# Patient Record
Sex: Female | Born: 2000
Health system: Southern US, Community
[De-identification: ages and names within clinical notes are randomized; demographics above are authoritative.]

## PROBLEM LIST (undated history)

## (undated) ENCOUNTER — Ambulatory Visit (HOSPITAL_COMMUNITY): Discharge status: Absent without leave | Payer: Medicaid Other

## (undated) DIAGNOSIS — R51 Headache: Secondary | ICD-10-CM

## (undated) DIAGNOSIS — H539 Unspecified visual disturbance: Secondary | ICD-10-CM

## (undated) DIAGNOSIS — J45909 Unspecified asthma, uncomplicated: Secondary | ICD-10-CM

## (undated) DIAGNOSIS — F32A Depression, unspecified: Secondary | ICD-10-CM

## (undated) DIAGNOSIS — F99 Mental disorder, not otherwise specified: Secondary | ICD-10-CM

## (undated) DIAGNOSIS — O24419 Gestational diabetes mellitus in pregnancy, unspecified control: Secondary | ICD-10-CM

## (undated) DIAGNOSIS — T7840XA Allergy, unspecified, initial encounter: Secondary | ICD-10-CM

## (undated) DIAGNOSIS — F3481 Disruptive mood dysregulation disorder: Secondary | ICD-10-CM

## (undated) DIAGNOSIS — E669 Obesity, unspecified: Secondary | ICD-10-CM

## (undated) DIAGNOSIS — F329 Major depressive disorder, single episode, unspecified: Secondary | ICD-10-CM

## (undated) DIAGNOSIS — F419 Anxiety disorder, unspecified: Secondary | ICD-10-CM

## (undated) DIAGNOSIS — Z0101 Encounter for examination of eyes and vision with abnormal findings: Secondary | ICD-10-CM

## (undated) DIAGNOSIS — F411 Generalized anxiety disorder: Secondary | ICD-10-CM

## (undated) HISTORY — DX: Unspecified asthma, uncomplicated: J45.909

## (undated) HISTORY — DX: Anxiety disorder, unspecified: F41.9

## (undated) HISTORY — DX: Gestational diabetes mellitus in pregnancy, unspecified control: O24.419

## (undated) HISTORY — DX: Generalized anxiety disorder: F41.1

## (undated) HISTORY — PX: NO PAST SURGERIES: SHX2092

## (undated) HISTORY — DX: Encounter for examination of eyes and vision with abnormal findings: Z01.01

## (undated) HISTORY — DX: Obesity, unspecified: E66.9

---
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
## 2022-01-07 ENCOUNTER — Ambulatory Visit: Payer: Medicaid Other | Admitting: Pediatrics

## 2022-01-13 ENCOUNTER — Encounter: Payer: Self-pay | Admitting: Pediatrics

## 2022-01-13 ENCOUNTER — Ambulatory Visit (INDEPENDENT_AMBULATORY_CARE_PROVIDER_SITE_OTHER): Payer: Medicaid Other | Admitting: Pediatrics

## 2022-01-13 ENCOUNTER — Other Ambulatory Visit: Payer: Self-pay

## 2022-01-13 ENCOUNTER — Other Ambulatory Visit (HOSPITAL_COMMUNITY)
Admission: RE | Admit: 2022-01-13 | Discharge: 2022-01-13 | Disposition: A | Discharge status: Home / Self Care | Payer: Medicaid Other | Source: Ambulatory Visit | Attending: Pediatrics | Admitting: Pediatrics

## 2022-01-13 VITALS — BP 135/83 | HR 106 | Ht 62.0 in | Wt 236.2 lb

## 2022-01-13 DIAGNOSIS — R0683 Snoring: Secondary | ICD-10-CM

## 2022-01-13 DIAGNOSIS — J309 Allergic rhinitis, unspecified: Secondary | ICD-10-CM

## 2022-01-13 DIAGNOSIS — J351 Hypertrophy of tonsils: Secondary | ICD-10-CM | POA: Insufficient documentation

## 2022-01-13 DIAGNOSIS — Z1389 Encounter for screening for other disorder: Secondary | ICD-10-CM

## 2022-01-13 DIAGNOSIS — R7303 Prediabetes: Secondary | ICD-10-CM

## 2022-01-13 DIAGNOSIS — F411 Generalized anxiety disorder: Secondary | ICD-10-CM | POA: Diagnosis not present

## 2022-01-13 DIAGNOSIS — Z113 Encounter for screening for infections with a predominantly sexual mode of transmission: Secondary | ICD-10-CM | POA: Insufficient documentation

## 2022-01-13 DIAGNOSIS — R030 Elevated blood-pressure reading, without diagnosis of hypertension: Secondary | ICD-10-CM | POA: Insufficient documentation

## 2022-01-13 DIAGNOSIS — Z68.41 Body mass index (BMI) pediatric, greater than or equal to 95th percentile for age: Secondary | ICD-10-CM

## 2022-01-13 DIAGNOSIS — F902 Attention-deficit hyperactivity disorder, combined type: Secondary | ICD-10-CM

## 2022-01-13 DIAGNOSIS — F332 Major depressive disorder, recurrent severe without psychotic features: Secondary | ICD-10-CM

## 2022-01-13 DIAGNOSIS — Z3202 Encounter for pregnancy test, result negative: Secondary | ICD-10-CM | POA: Diagnosis not present

## 2022-01-13 DIAGNOSIS — G479 Sleep disorder, unspecified: Secondary | ICD-10-CM

## 2022-01-13 LAB — POCT URINALYSIS DIPSTICK
Bilirubin, UA: NEGATIVE
Blood, UA: NEGATIVE
Glucose, UA: NEGATIVE
Ketones, UA: NEGATIVE
Leukocytes, UA: NEGATIVE
Nitrite, UA: NEGATIVE
Protein, UA: NEGATIVE
Spec Grav, UA: 1.02 (ref 1.010–1.025)
Urobilinogen, UA: NEGATIVE E.U./dL — AB
pH, UA: 5 (ref 5.0–8.0)

## 2022-01-13 LAB — POCT URINE PREGNANCY: Preg Test, Ur: NEGATIVE

## 2022-01-13 MED ORDER — LISDEXAMFETAMINE DIMESYLATE 40 MG PO CAPS: 40.0000 mg | ORAL_CAPSULE | Freq: Every day | ORAL | 0 refills | Status: DC

## 2022-01-13 MED ORDER — LEVOCETIRIZINE DIHYDROCHLORIDE 5 MG PO TABS: 5.0000 mg | ORAL_TABLET | Freq: Every evening | ORAL | 3 refills | Status: DC

## 2022-01-13 MED ORDER — INSUPEN PEN NEEDLES 32G X 4 MM MISC: 3 refills | Status: DC

## 2022-01-13 MED ORDER — VICTOZA 18 MG/3ML ~~LOC~~ SOPN: 1.2000 mg | PEN_INJECTOR | Freq: Every day | SUBCUTANEOUS | 0 refills | Status: DC

## 2022-01-13 MED ORDER — PRENATAL VITAMIN 27-0.8 MG PO TABS: 1.0000 | ORAL_TABLET | Freq: Every day | ORAL | 3 refills | Status: DC

## 2022-01-13 MED ORDER — TRIAMCINOLONE ACETONIDE 0.5 % EX OINT: 1.0000 "application " | TOPICAL_OINTMENT | Freq: Two times a day (BID) | CUTANEOUS | 3 refills | Status: DC

## 2022-01-13 NOTE — Patient Instructions (Addendum)
Sent referral to ENT - [phone] Start xyzal for itching and allergies  Restart vyvanse  Restart victoza  Triamcinolone for eczema

## 2022-01-13 NOTE — Progress Notes (Signed)
History was provided by the patient.  Courtney Holloway is a 21 y.o. female who is here for MDD, GAD, obesity, sleep  Courtney Mcburney, FNP   HPI:  Pt reports she has been working full time and she graduates in May. 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 She had a new boyfriend but he was cheating on her so they broke up. Still living with her aunt. 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   She went to the orthodontist but treatment very expensive. Orthodontist as concerned that her tonsils are very large and her breathing was very loud. Adenoids also enlarged. She snores very loudly at night. She continues to be very tired during the day and does not feel well rested at all. She uses coffee and red bull to stay awake.   Having increased pain from large breasts. She is planning to continue working on losing weight.   She has been considering birth control more. She still wants kids but not sure it is the right time in her life. Considering IUD but not currently sexually active.   LMP was 12/25/2021  Has not been taking anything except wellbutrin and valtrex. Would like to restart victoza, vyvanse, prenatal vitamin.   PHQ-SADS Last 3 Score only 01/13/2022 11/14/2021 10/08/2021  PHQ-15 Score 19 18 18   Total GAD-7 Score 14 14 14   PHQ Adolescent Score 18 15 13   Some encounter information is confidential and restricted. Go to Review Flowsheets activity to see all data.      Patient's last menstrual period was 12/25/2021.  Review of Systems  Constitutional:  Positive for malaise/fatigue.  HENT:  Positive for congestion.   Respiratory:  Negative for cough.   Skin:  Positive for itching.  Endo/Heme/Allergies:  Positive for environmental allergies.  Psychiatric/Behavioral:  Positive for depression. Negative for suicidal ideas. The patient is nervous/anxious.    Patient Active Problem List   Diagnosis Date Noted   Elevated blood pressure  reading without diagnosis of hypertension 01/13/2022   Enlarged tonsils 01/13/2022   Body mass index (BMI) of 39.0 to 39.9 in adult 09/02/2021   Irregular menses 08/19/2021   Prediabetes 08/04/2021   Nightmares associated with chronic post-traumatic stress disorder 04/01/2021   Food allergy 04/01/2021   Anal fissure 03/28/2021   Sleep disturbance 03/21/2021   Drug overdose, multiple drugs, undetermined intent, initial encounter 02/02/2021   Attention deficit hyperactivity disorder (ADHD), combined type 01/28/2021   MDD (major depressive disorder), recurrent severe, without psychosis ([place]) 01/16/2021   HSV-1 (herpes simplex virus 1) infection 08/02/2020   Menorrhagia with regular cycle 07/05/2020   Dyslipidemia 02/15/2018   Hidradenitis axillaris 07/11/2016   Failed vision screen 11/21/2014   Allergic rhinitis 11/21/2014   Slow transit constipation 11/21/2014   Asthma 05/04/2013   GAD (generalized anxiety disorder) 11/02/2012   Post-traumatic stress disorder 11/02/2012    Current Outpatient Medications on File Prior to Visit  Medication Sig Dispense Refill   albuterol (VENTOLIN HFA) 108 (90 Base) MCG/ACT inhaler Inhale 2 puffs into the lungs every 4 (four) hours as needed for wheezing or shortness of breath. 18 g 1   buPROPion (WELLBUTRIN XL) 150 MG 24 hr tablet Take 1 tablet (150 mg total) by mouth daily. 30 tablet 3   EPINEPHrine (EPIPEN 2-PAK) 0.3 mg/0.3 mL IJ SOAJ injection Inject 0.3 mg into the muscle as needed for anaphylaxis. 1 each 1   valACYclovir (VALTREX) 1000 MG tablet Take 1  tablet (1,000 mg total) by mouth daily. 30 tablet 3   No current facility-administered medications on file prior to visit.    Allergies  Allergen Reactions   Other Anaphylaxis and Itching    Seasonal allergies, dog fur, certain detergents  Pt. Reports mother has used "epi-pen" for past allergies.    Pineapple Anaphylaxis    Social History: Confidentiality was discussed with the patient and  if applicable, with caregiver as well. Tobacco: yes Secondhand smoke exposure? yes - aunt Drugs/EtOH: MJ Sexually active? Just broke up with partner   Safety: safe to self today and at home, no SI.  Last STI Screening:today  Pregnancy Prevention: none, considering.   Physical Exam:    Vitals:   01/13/22 0935  BP: 135/83  Pulse: (!) 106  Weight: 236 lb 3.2 oz (107.1 kg)  Height: 5\' 2"  (1.575 m)    Blood pressure percentiles are not available for patients who are 18 years or older.  Physical Exam Vitals and nursing note reviewed.  Constitutional:      General: She is not in acute distress.    Appearance: She is well-developed.  HENT:     Right Ear: No middle ear effusion.     Left Ear:  No middle ear effusion.     Nose:     Right Turbinates: Enlarged and swollen.     Left Turbinates: Enlarged and swollen.     Mouth/Throat:     Tonsils: No tonsillar exudate. 4+ on the right. 4+ on the left.  Neck:     Thyroid: No thyromegaly.  Cardiovascular:     Rate and Rhythm: Normal rate and regular rhythm.     Heart sounds: No murmur heard. Pulmonary:     Breath sounds: Normal breath sounds.  Abdominal:     Palpations: Abdomen is soft. There is no mass.     Tenderness: There is no abdominal tenderness. There is no guarding.  Musculoskeletal:     Right lower leg: No edema.     Left lower leg: No edema.  Lymphadenopathy:     Cervical: No cervical adenopathy.  Skin:    General: Skin is warm.     Findings: No rash.  Neurological:     Mental Status: She is alert.     Comments: No tremor  Psychiatric:        Mood and Affect: Mood and affect normal.        Thought Content: Thought content does not include suicidal ideation.    Assessment/Plan: 1. MDD (major depressive disorder), recurrent severe, without psychosis ([place]) Continue wellbutrin. We discussed restarting trileptal or desvenlafaxine but she declined today and feels ok. PHQSADs is stable with no SI/HI today.   2. GAD  (generalized anxiety disorder) As above. Has a mentor which has been helpful.   3. Severe obesity due to excess calories without serious comorbidity with body mass index (BMI) greater than 99th percentile for age in pediatric patient [facility]) Restart victoza; she is working with her aunt on healthier eating.  - VICTOZA 18 MG/3ML SOPN; Inject 1.2 mg into the skin daily.  Dispense: 6 mL; Refill: 0  4. Snoring Snoring likely d/t significantly enlarged tonsils and reportedly adenoids. Will send to ENT for eval for removal.   5. Enlarged tonsils May be contributing to fatigue, htn and weight gain.  - Ambulatory referral to ENT  6. Prediabetes Recheck A1C today, restart victoza.  - Hemoglobin A1c - VICTOZA 18 MG/3ML SOPN; Inject 1.2 mg into the skin  daily.  Dispense: 6 mL; Refill: 0 - Prenatal Vit-Fe Fumarate-FA (PRENATAL VITAMIN) 27-0.8 MG TABS; Take 1 tablet by mouth daily.  Dispense: 30 tablet; Refill: 3 - Insulin Pen Needle (INSUPEN PEN NEEDLES) 32G X 4 MM MISC; BD Pen Needles- use one for victoza  Dispense: 90 each; Refill: 3  7. Attention deficit hyperactivity disorder (ADHD), combined type Increase vyvanse and restart. May help with binge eating as well.  - lisdexamfetamine (VYVANSE) 40 MG capsule; Take 1 capsule (40 mg total) by mouth daily with breakfast.  Dispense: 30 capsule; Refill: 0  8. Allergic rhinitis, unspecified seasonality, unspecified trigger Restart xyzal- should help with itching r/t dogs as well.  - levocetirizine (XYZAL) 5 MG tablet; Take 1 tablet (5 mg total) by mouth every evening.  Dispense: 30 tablet; Refill: 3  9. Screening for genitourinary condition WNL. No glucose or ketones.   10. Pregnancy examination or test, negative result Negative. Does not desire pregnancy currently- we discussed contraception options and is considering IUD.  - POCT urine pregnancy  11. Routine screening for STI (sexually transmitted infection) New partner, STI screens today.  -  Urine cytology ancillary only - HIV antibody (with reflex) - RPR - WET PREP BY MOLECULAR PROBE  Return in 4 weeks or sooner as needed.   Jonathon Resides, FNP   I spent >40 minutes spent face to face with patient with more than 50% of appointment spent discussing diagnosis, management, follow-up, and reviewing of adhd, mdd, gad, predm, snoring, tonsils. I spent an additional 0 minutes on pre-and post-visit activities.

## 2022-01-14 LAB — WET PREP BY MOLECULAR PROBE
Candida species: NOT DETECTED
Gardnerella vaginalis: NOT DETECTED
MICRO NUMBER:: 12938185
SPECIMEN QUALITY:: ADEQUATE
Trichomonas vaginosis: NOT DETECTED

## 2022-01-14 LAB — HEMOGLOBIN A1C
Hgb A1c MFr Bld: 5.9 % of total Hgb — ABNORMAL HIGH (ref <5.7)
Mean Plasma Glucose: 123 mg/dL
eAG (mmol/L): 6.8 mmol/L

## 2022-01-14 LAB — URINE CYTOLOGY ANCILLARY ONLY
Bacterial Vaginitis-Urine: NEGATIVE
Candida Urine: POSITIVE — AB
Chlamydia: NEGATIVE
Comment: NEGATIVE
Comment: NEGATIVE
Comment: NORMAL
Neisseria Gonorrhea: NEGATIVE
Trichomonas: NEGATIVE

## 2022-01-14 LAB — HIV ANTIBODY (ROUTINE TESTING W REFLEX): HIV 1&2 Ab, 4th Generation: NONREACTIVE

## 2022-01-14 LAB — TSH+FREE T4: TSH W/REFLEX TO FT4: 1.09 mIU/L

## 2022-01-14 LAB — RPR: RPR Ser Ql: NONREACTIVE

## 2022-01-20 ENCOUNTER — Other Ambulatory Visit: Payer: Self-pay | Admitting: Pediatrics

## 2022-01-20 MED ORDER — FLUCONAZOLE 150 MG PO TABS: ORAL_TABLET | ORAL | 0 refills | Status: DC

## 2022-02-06 ENCOUNTER — Ambulatory Visit: Payer: Medicaid Other | Admitting: Pediatrics

## 2022-02-13 ENCOUNTER — Ambulatory Visit: Payer: Medicaid Other | Admitting: Obstetrics

## 2022-02-28 ENCOUNTER — Encounter: Payer: Self-pay | Admitting: Family

## 2022-02-28 ENCOUNTER — Other Ambulatory Visit: Payer: Self-pay

## 2022-02-28 ENCOUNTER — Ambulatory Visit (INDEPENDENT_AMBULATORY_CARE_PROVIDER_SITE_OTHER): Payer: Medicaid Other | Admitting: Family

## 2022-02-28 ENCOUNTER — Encounter: Payer: Self-pay | Admitting: Pediatrics

## 2022-02-28 VITALS — BP 105/68 | HR 98 | Wt 238.8 lb

## 2022-02-28 DIAGNOSIS — N941 Unspecified dyspareunia: Secondary | ICD-10-CM | POA: Diagnosis not present

## 2022-02-28 DIAGNOSIS — N898 Other specified noninflammatory disorders of vagina: Secondary | ICD-10-CM

## 2022-02-28 DIAGNOSIS — Z3202 Encounter for pregnancy test, result negative: Secondary | ICD-10-CM | POA: Diagnosis not present

## 2022-02-28 DIAGNOSIS — N926 Irregular menstruation, unspecified: Secondary | ICD-10-CM | POA: Diagnosis not present

## 2022-02-28 DIAGNOSIS — N949 Unspecified condition associated with female genital organs and menstrual cycle: Secondary | ICD-10-CM | POA: Diagnosis not present

## 2022-02-28 DIAGNOSIS — Z113 Encounter for screening for infections with a predominantly sexual mode of transmission: Secondary | ICD-10-CM

## 2022-02-28 LAB — POCT URINE PREGNANCY: Preg Test, Ur: NEGATIVE

## 2022-02-28 MED ORDER — DOXYCYCLINE HYCLATE 100 MG PO CAPS: 100.0000 mg | ORAL_CAPSULE | Freq: Two times a day (BID) | ORAL | 0 refills | Status: DC

## 2022-02-28 MED ORDER — CEFTRIAXONE SODIUM 500 MG IJ SOLR
500.0000 mg | Freq: Once | INTRAMUSCULAR | Status: AC
  Administered 2022-02-28: 500 mg via INTRAMUSCULAR

## 2022-02-28 MED ORDER — METRONIDAZOLE 500 MG PO TABS: 500.0000 mg | ORAL_TABLET | Freq: Two times a day (BID) | ORAL | 0 refills | Status: DC

## 2022-02-28 NOTE — Progress Notes (Signed)
History was provided by the patient. ? ?Courtney Holloway is a 21 y.o. female who is here for vaginal discharge, concern for STIs, pain with intercourse.  ? ?PCP confirmed? Yes.   ? Verneda Skill, FNP ? 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 ?Pain with intercourse, with penetration and insertion  ?Female partner smells bad  ?Discharge: grey, clear thick liquid  ?Birth control: none; didn't want pg with this female partner; but was interested in pg with other; hasn't been working; taking prenatals but can't get pregnant. Frustrated by this.  ?LMP: something is off, has been very late; last period was 2/9-2/12, 1/11-1/15; was supposed to come on 3/7  ?Had intercourse 2/10, 2/16, 2/20   ? ?Patient Active Problem List  ? Diagnosis Date Noted  ? Elevated blood pressure reading without diagnosis of hypertension 01/13/2022  ? Enlarged tonsils 01/13/2022  ? Body mass index (BMI) of 39.0 to 39.9 in adult 09/02/2021  ? Irregular menses 08/19/2021  ? Prediabetes 08/04/2021  ? Nightmares associated with chronic post-traumatic stress disorder 04/01/2021  ? Food allergy 04/01/2021  ? Anal fissure 03/28/2021  ? Sleep disturbance 03/21/2021  ? Drug overdose, multiple drugs, undetermined intent, initial encounter 02/02/2021  ? Attention deficit hyperactivity disorder (ADHD), combined type 01/28/2021  ? MDD (major depressive disorder), recurrent severe, without psychosis ([facility]) 01/16/2021  ? HSV-1 (herpes simplex virus 1) infection 08/02/2020  ? Menorrhagia with regular cycle 07/05/2020  ? Dyslipidemia 02/15/2018  ? Hidradenitis axillaris 07/11/2016  ? Failed vision screen 11/21/2014  ? Allergic rhinitis 11/21/2014  ? Slow transit constipation 11/21/2014  ? Asthma 05/04/2013  ? GAD (generalized anxiety disorder) 11/02/2012  ? Post-traumatic stress disorder 11/02/2012  ? ? ?Current  Outpatient Medications on File Prior to Visit  ?Medication Sig Dispense Refill  ? albuterol (VENTOLIN HFA) 108 (90 Base) MCG/ACT inhaler Inhale 2 puffs into the lungs every 4 (four) hours as needed for wheezing or shortness of breath. 18 g 1  ? buPROPion (WELLBUTRIN XL) 150 MG 24 hr tablet Take 1 tablet (150 mg total) by mouth daily. 30 tablet 3  ? EPINEPHrine (EPIPEN 2-PAK) 0.3 mg/0.3 mL IJ SOAJ injection Inject 0.3 mg into the muscle as needed for anaphylaxis. 1 each 1  ? fluconazole (DIFLUCAN) 150 MG tablet Take 1 tablet today and 1 tablet 3 days from now (Patient not taking: Reported on 02/28/2022) 2 tablet 0  ? Insulin Pen Needle (INSUPEN PEN NEEDLES) 32G X 4 MM MISC BD Pen Needles- use one for victoza 90 each 3  ? levocetirizine (XYZAL) 5 MG tablet Take 1 tablet (5 mg total) by mouth every evening. 30 tablet 3  ? lisdexamfetamine (VYVANSE) 40 MG capsule Take 1 capsule (40 mg total) by mouth daily with breakfast. 30 capsule 0  ? Prenatal Vit-Fe Fumarate-FA (PRENATAL VITAMIN) 27-0.8 MG TABS Take 1 tablet by mouth daily. 30 tablet 3  ? triamcinolone ointment (KENALOG) 0.5 % Apply 1 application topically 2 (two) times daily. 60 g 3  ? valACYclovir (VALTREX) 1000 MG tablet Take 1 tablet (1,000 mg total) by mouth daily. 30 tablet 3  ? VICTOZA 18 MG/3ML SOPN Inject 1.2 mg into the skin daily. 6 mL 0  ? ?No current facility-administered medications on file prior to visit.  ? ? ?Allergies  ?Allergen Reactions  ? Other Anaphylaxis and Itching  ?  Seasonal allergies, dog fur, certain detergents  Pt. Reports mother has used "epi-pen" for past allergies.   ? Pineapple Anaphylaxis  ? ? ?Physical Exam:  ?  ?Vitals:  ? 02/28/22 0920  ?BP: 105/68  ?Pulse: 98  ?Weight: 238 lb 12.8 oz (108.3 kg)  ? ? ?Blood pressure percentiles are not available for patients who are 18 years or older. ?No LMP recorded. ? ?Physical Exam ?Exam conducted with a chaperone present.  ?Constitutional:   ?   General: She is not in acute distress. ?    Appearance: She is well-developed.  ?HENT:  ?   Head: Normocephalic and atraumatic.  ?Eyes:  ?   General: No scleral icterus. ?   Pupils: Pupils are equal, round, and reactive to light.  ?Neck:  ?   Thyroid: No thyromegaly.  ?Cardiovascular:  ?   Rate and Rhythm: Normal rate and regular rhythm.  ?   Heart sounds: Normal heart sounds. No murmur heard. ?Pulmonary:  ?   Effort: Pulmonary effort is normal.  ?   Breath sounds: Normal breath sounds.  ?Abdominal:  ?   Palpations: Abdomen is soft.  ?Genitourinary: ?   General: Normal vulva.  ?   Vagina: Vaginal discharge present.  ?   Cervix: Cervical motion tenderness and discharge present. No friability or cervical bleeding.  ?   Comments: Thick white discharge  ?Musculoskeletal:     ?   General: Normal range of motion.  ?   Cervical back: Normal range of motion and neck supple.  ?Lymphadenopathy:  ?   Cervical: No cervical adenopathy.  ?Skin: ?   General: Skin is warm and dry.  ?   Findings: No rash.  ?Neurological:  ?   Mental Status: She is alert and oriented to person, place, and time.  ?   Cranial Nerves: No cranial nerve deficit.  ?Psychiatric:     ?   Behavior: Behavior normal.     ?   Thought Content: Thought content normal.     ?   Judgment: Judgment normal.  ?  ? ?Assessment/Plan: ?1. Late period ?2. Dyspareunia in female ?3. CMT (cervical motion tenderness) ?4. Vaginal discharge ? ?-will obtain beta hcg today, negative UPT. +CMT on exam and tenderness with speculum; will treat for PID ?-swabs obtained and will relay results via My Chart or phone; continue with DMV/PNV ? ?

## 2022-03-02 LAB — C. TRACHOMATIS/N. GONORRHOEAE RNA
C. trachomatis RNA, TMA: NOT DETECTED
N. gonorrhoeae RNA, TMA: NOT DETECTED

## 2022-03-03 LAB — WET PREP BY MOLECULAR PROBE
Candida species: NOT DETECTED
Gardnerella vaginalis: NOT DETECTED
MICRO NUMBER:: 13148598
SPECIMEN QUALITY:: ADEQUATE
Trichomonas vaginosis: NOT DETECTED

## 2022-03-11 ENCOUNTER — Other Ambulatory Visit (HOSPITAL_COMMUNITY): Payer: Self-pay

## 2022-03-11 ENCOUNTER — Other Ambulatory Visit: Payer: Self-pay | Admitting: Pediatrics

## 2022-03-11 DIAGNOSIS — F902 Attention-deficit hyperactivity disorder, combined type: Secondary | ICD-10-CM

## 2022-03-11 MED ORDER — LISDEXAMFETAMINE DIMESYLATE 40 MG PO CAPS
40.0000 mg | ORAL_CAPSULE | Freq: Every day | ORAL | 0 refills | Status: DC
  Filled 2022-03-11: qty 30, 30d supply, fill #0

## 2022-03-13 ENCOUNTER — Ambulatory Visit: Payer: Medicaid Other | Admitting: Family

## 2022-03-20 ENCOUNTER — Ambulatory Visit: Payer: Medicaid Other | Admitting: Family

## 2022-04-10 ENCOUNTER — Ambulatory Visit: Payer: Medicaid Other

## 2022-04-15 ENCOUNTER — Other Ambulatory Visit (HOSPITAL_COMMUNITY): Payer: Self-pay

## 2022-04-15 ENCOUNTER — Other Ambulatory Visit: Payer: Self-pay | Admitting: Pediatrics

## 2022-04-15 DIAGNOSIS — F902 Attention-deficit hyperactivity disorder, combined type: Secondary | ICD-10-CM

## 2022-04-16 ENCOUNTER — Other Ambulatory Visit (HOSPITAL_COMMUNITY): Payer: Self-pay

## 2022-04-16 MED ORDER — LISDEXAMFETAMINE DIMESYLATE 40 MG PO CAPS
40.0000 mg | ORAL_CAPSULE | Freq: Every day | ORAL | 0 refills | Status: DC
  Filled 2022-04-16: qty 30, 30d supply, fill #0

## 2022-04-18 ENCOUNTER — Telehealth: Payer: Medicaid Other | Admitting: Family

## 2022-04-18 ENCOUNTER — Encounter: Payer: Medicaid Other | Admitting: Physician Assistant

## 2022-04-18 DIAGNOSIS — J069 Acute upper respiratory infection, unspecified: Secondary | ICD-10-CM

## 2022-04-18 MED ORDER — FLUTICASONE PROPIONATE 50 MCG/ACT NA SUSP: 2.0000 | Freq: Every day | NASAL | 6 refills | Status: DC

## 2022-04-18 MED ORDER — BENZONATATE 100 MG PO CAPS: 100.0000 mg | ORAL_CAPSULE | Freq: Three times a day (TID) | ORAL | 0 refills | Status: DC | PRN

## 2022-04-18 NOTE — Progress Notes (Signed)
The patient no-showed for appointment despite this provider sending direct link x 2 with no response and waiting for at least 10 minutes from appointment time for patient to join. They will be marked as a NS for this appointment/time.  ? ?Patient did complete an E-visit as well. Suspect error duplicate. Will mark erroneous.  ? ?Margaretann Loveless, PA-C ? ? ? ?

## 2022-04-18 NOTE — Progress Notes (Signed)

## 2022-04-27 ENCOUNTER — Encounter: Payer: Self-pay | Admitting: Family

## 2022-04-28 ENCOUNTER — Ambulatory Visit: Payer: Medicaid Other

## 2022-05-01 ENCOUNTER — Ambulatory Visit (INDEPENDENT_AMBULATORY_CARE_PROVIDER_SITE_OTHER): Payer: Medicaid Other

## 2022-05-01 DIAGNOSIS — Z3202 Encounter for pregnancy test, result negative: Secondary | ICD-10-CM

## 2022-05-01 DIAGNOSIS — Z113 Encounter for screening for infections with a predominantly sexual mode of transmission: Secondary | ICD-10-CM

## 2022-05-01 LAB — POCT URINE PREGNANCY: Preg Test, Ur: NEGATIVE

## 2022-05-01 NOTE — Progress Notes (Signed)
Pt here today for STI screening.Follow up appointment scheduled with provider to discuss further issues per pt.

## 2022-05-02 ENCOUNTER — Encounter: Payer: Self-pay | Admitting: Family

## 2022-05-02 ENCOUNTER — Ambulatory Visit (INDEPENDENT_AMBULATORY_CARE_PROVIDER_SITE_OTHER): Payer: Medicaid Other | Admitting: Family

## 2022-05-02 VITALS — BP 115/80 | HR 101 | Ht 62.3 in | Wt 228.8 lb

## 2022-05-02 DIAGNOSIS — L258 Unspecified contact dermatitis due to other agents: Secondary | ICD-10-CM | POA: Diagnosis not present

## 2022-05-02 DIAGNOSIS — S30810A Abrasion of lower back and pelvis, initial encounter: Secondary | ICD-10-CM

## 2022-05-02 DIAGNOSIS — L732 Hidradenitis suppurativa: Secondary | ICD-10-CM | POA: Diagnosis not present

## 2022-05-02 DIAGNOSIS — N898 Other specified noninflammatory disorders of vagina: Secondary | ICD-10-CM

## 2022-05-02 LAB — C. TRACHOMATIS/N. GONORRHOEAE RNA
C. trachomatis RNA, TMA: NOT DETECTED
N. gonorrhoeae RNA, TMA: NOT DETECTED

## 2022-05-02 LAB — WET PREP BY MOLECULAR PROBE
Candida species: NOT DETECTED
Gardnerella vaginalis: NOT DETECTED
MICRO NUMBER:: 13415706
SPECIMEN QUALITY:: ADEQUATE
Trichomonas vaginosis: NOT DETECTED

## 2022-05-02 MED ORDER — ELLA 30 MG PO TABS: 1.0000 | ORAL_TABLET | Freq: Once | ORAL | 0 refills | Status: AC

## 2022-05-02 MED ORDER — HYDROCORTISONE 0.5 % EX OINT: 1.0000 "application " | TOPICAL_OINTMENT | Freq: Two times a day (BID) | CUTANEOUS | 0 refills | Status: DC

## 2022-05-02 NOTE — Progress Notes (Signed)
History was provided by the patient.  Courtney Holloway is a 21 y.o. female who is here for eye stye, underarm bump, and vaginal issues.   PCP confirmed? Yes.    Verneda Skill, FNP  Plan from last visit:    HPI:   -scheduled for August for primary care   -stye on eye is getting bigger on R eye -hurts under L armpit; filled with something - ongoing for a week and a half; no shaving; no new deodorant -broke out in rash on butt, crack area - keeps opening, doesn't wash it with soap; google says not to do that; ongoing for 2 weeks; living in new apartment that was furnished; sits primarily on couch; has personal mattress cover/sheets; carpet flooring; no pets; no new deodorants/personal products  -persistent BV, although she is aware that  -something burns at vulva with touching; every time she does something sexually with partner; has been consistent with yeast; using lube from pharmacy; no scent; endorses that BF wants her to get pregnant, but she continues to use condoms and does not want to get pregnant; she finds that sexual intercourse with partner does not provide pleasure and always results in a change in her discharge, discomfort or concerns. She denies any safety concerns in relationship and endorses and good friendship with partner.  -allergy meds needs refill  -good for Vyvanse   Patient Active Problem List   Diagnosis Date Noted   Elevated blood pressure reading without diagnosis of hypertension 01/13/2022   Enlarged tonsils 01/13/2022   Body mass index (BMI) of 39.0 to 39.9 in adult 09/02/2021   Irregular menses 08/19/2021   Prediabetes 08/04/2021   Nightmares associated with chronic post-traumatic stress disorder 04/01/2021   Food allergy 04/01/2021   Anal fissure 03/28/2021   Sleep disturbance 03/21/2021   Drug overdose, multiple drugs, undetermined intent, initial encounter 02/02/2021   Attention deficit hyperactivity disorder (ADHD), combined type 01/28/2021   MDD  (major depressive disorder), recurrent severe, without psychosis (HCC) 01/16/2021   HSV-1 (herpes simplex virus 1) infection 08/02/2020   Menorrhagia with regular cycle 07/05/2020   Dyslipidemia 02/15/2018   Hidradenitis axillaris 07/11/2016   Failed vision screen 11/21/2014   Allergic rhinitis 11/21/2014   Slow transit constipation 11/21/2014   Asthma 05/04/2013   GAD (generalized anxiety disorder) 11/02/2012   Post-traumatic stress disorder 11/02/2012    Current Outpatient Medications on File Prior to Visit  Medication Sig Dispense Refill   albuterol (VENTOLIN HFA) 108 (90 Base) MCG/ACT inhaler Inhale 2 puffs into the lungs every 4 (four) hours as needed for wheezing or shortness of breath. 18 g 1   EPINEPHrine (EPIPEN 2-PAK) 0.3 mg/0.3 mL IJ SOAJ injection Inject 0.3 mg into the muscle as needed for anaphylaxis. 1 each 1   fluticasone (FLONASE) 50 MCG/ACT nasal spray Place 2 sprays into both nostrils daily. 16 g 6   Insulin Pen Needle (INSUPEN PEN NEEDLES) 32G X 4 MM MISC BD Pen Needles- use one for victoza 90 each 3   lisdexamfetamine (VYVANSE) 40 MG capsule Take 1 capsule (40 mg total) by mouth daily with breakfast. 30 capsule 0   triamcinolone ointment (KENALOG) 0.5 % Apply 1 application topically 2 (two) times daily. 60 g 3   valACYclovir (VALTREX) 1000 MG tablet Take 1 tablet (1,000 mg total) by mouth daily. 30 tablet 3   VICTOZA 18 MG/3ML SOPN Inject 1.2 mg into the skin daily. 6 mL 0   benzonatate (TESSALON PERLES) 100 MG capsule Take 1 capsule (100  mg total) by mouth 3 (three) times daily as needed. (Patient not taking: Reported on 05/02/2022) 20 capsule 0   buPROPion (WELLBUTRIN XL) 150 MG 24 hr tablet Take 1 tablet (150 mg total) by mouth daily. (Patient not taking: Reported on 05/02/2022) 30 tablet 3   doxycycline (VIBRAMYCIN) 100 MG capsule Take 1 capsule (100 mg total) by mouth 2 (two) times daily. (Patient not taking: Reported on 05/02/2022) 28 capsule 0   levocetirizine  (XYZAL) 5 MG tablet Take 1 tablet (5 mg total) by mouth every evening. (Patient not taking: Reported on 05/02/2022) 30 tablet 3   metroNIDAZOLE (FLAGYL) 500 MG tablet Take 1 tablet (500 mg total) by mouth 2 (two) times daily. (Patient not taking: Reported on 05/02/2022) 28 tablet 0   Prenatal Vit-Fe Fumarate-FA (PRENATAL VITAMIN) 27-0.8 MG TABS Take 1 tablet by mouth daily. (Patient not taking: Reported on 05/02/2022) 30 tablet 3   No current facility-administered medications on file prior to visit.    Allergies  Allergen Reactions   Other Anaphylaxis and Itching    Seasonal allergies, dog fur, certain detergents  Pt. Reports mother has used "epi-pen" for past allergies.    Pineapple Anaphylaxis    Physical Exam:    Vitals:   05/02/22 1035  BP: 115/80  Pulse: (!) 101  Weight: 228 lb 12.8 oz (103.8 kg)  Height: 5' 2.3" (1.582 m)   Wt Readings from Last 3 Encounters:  05/02/22 228 lb 12.8 oz (103.8 kg) (99 %, Z= 2.29)*  02/28/22 238 lb 12.8 oz (108.3 kg) (>99 %, Z= 2.37)*  01/13/22 236 lb 3.2 oz (107.1 kg) (>99 %, Z= 2.35)*   * Growth percentiles are based on CDC (Girls, 2-20 Years) data.     Blood pressure percentiles are not available for patients who are 18 years or older. No LMP recorded.  Physical Exam Constitutional:      General: She is not in acute distress.    Appearance: She is well-developed.  HENT:     Head: Normocephalic and atraumatic.  Eyes:     General: No scleral icterus.    Pupils: Pupils are equal, round, and reactive to light.  Neck:     Thyroid: No thyromegaly.  Cardiovascular:     Rate and Rhythm: Normal rate and regular rhythm.     Heart sounds: Normal heart sounds. No murmur heard. Pulmonary:     Effort: Pulmonary effort is normal.     Breath sounds: Normal breath sounds.  Abdominal:     Palpations: Abdomen is soft.  Musculoskeletal:        General: Normal range of motion.     Cervical back: Normal range of motion and neck supple.   Lymphadenopathy:     Cervical: No cervical adenopathy.  Skin:    General: Skin is warm and dry.     Findings: No rash.     Comments: Excoriation around gluteal crease, scattered small erythematous lesions consistent with contact dermatitis; no discharge, no infection  Hurley stage 1 - one inflamed nodule, no scarring or tunneling - L axilla   Neurological:     Mental Status: She is alert and oriented to person, place, and time.     Cranial Nerves: No cranial nerve deficit.  Psychiatric:        Behavior: Behavior normal.        Thought Content: Thought content normal.        Judgment: Judgment normal.     Assessment/Plan: 1. Excoriation of buttock, initial encounter  2. Contact dermatitis due to other agent, unspecified contact dermatitis type 3. Hidradenitis suppurativa of left axilla 4. Vaginal irritation  -hydrocortisone on affected areas of buttocks/gluteal folds  -reassurance that GU exam is WNL, reviewed recent results - no sign of infection; encouraged lubricant use, condom use discussed; advised clear communication with partner re: pregnancy intention/pregnancy prevention; EC to pharmacy  -clinda 1% lotion twice daily for HS in affected axilla  -return as needed; scheduled for primary care transition in August per patient

## 2022-05-02 NOTE — Patient Instructions (Signed)
Hydrocortisone on the affected areas   Warm wet compress to the right eye three times per day x 5 minutes each   Congratulations on your graduation!!

## 2022-05-04 ENCOUNTER — Telehealth: Payer: Medicaid Other

## 2022-05-04 ENCOUNTER — Encounter: Payer: Self-pay | Admitting: Family

## 2022-05-04 ENCOUNTER — Telehealth: Payer: Medicaid Other | Admitting: Family

## 2022-05-04 DIAGNOSIS — H00011 Hordeolum externum right upper eyelid: Secondary | ICD-10-CM | POA: Diagnosis not present

## 2022-05-04 DIAGNOSIS — R21 Rash and other nonspecific skin eruption: Secondary | ICD-10-CM

## 2022-05-04 DIAGNOSIS — H60501 Unspecified acute noninfective otitis externa, right ear: Secondary | ICD-10-CM

## 2022-05-04 MED ORDER — CLINDAMYCIN PHOSPHATE 1 % EX LOTN: TOPICAL_LOTION | Freq: Two times a day (BID) | CUTANEOUS | 0 refills | Status: DC

## 2022-05-04 MED ORDER — LEVOCETIRIZINE DIHYDROCHLORIDE 5 MG PO TABS: 5.0000 mg | ORAL_TABLET | Freq: Every evening | ORAL | 0 refills | Status: DC

## 2022-05-04 MED ORDER — BACITRACIN-POLYMYXIN B 500-10000 UNIT/GM OP OINT
1.0000 | TOPICAL_OINTMENT | Freq: Two times a day (BID) | OPHTHALMIC | 0 refills | Status: DC
Start: 2022-05-04 — End: 2022-05-26

## 2022-05-04 MED ORDER — AMOXICILLIN-POT CLAVULANATE 875-125 MG PO TABS: 1.0000 | ORAL_TABLET | Freq: Two times a day (BID) | ORAL | 0 refills | Status: DC

## 2022-05-04 NOTE — Progress Notes (Signed)
Virtual Visit Consent   Courtney Holloway, you are scheduled for a virtual visit with a [facility] provider today. Just as with appointments in the office, your consent must be obtained to participate. Your consent will be active for this visit and any virtual visit you may have with one of our providers in the next 365 days. If you have a MyChart account, a copy of this consent can be sent to you electronically.  As this is a virtual visit, video technology does not allow for your provider to perform a traditional examination. This may limit your provider's ability to fully assess your condition. If your provider identifies any concerns that need to be evaluated in person or the need to arrange testing (such as labs, EKG, etc.), we will make arrangements to do so. Although advances in technology are sophisticated, we cannot ensure that it will always work on either your end or our end. If the connection with a video visit is poor, the visit may have to be switched to a telephone visit. With either a video or telephone visit, we are not always able to ensure that we have a secure connection.  By engaging in this virtual visit, you consent to the provision of healthcare and authorize for your insurance to be billed (if applicable) for the services provided during this visit. Depending on your insurance coverage, you may receive a charge related to this service.  I need to obtain your verbal consent now. Are you willing to proceed with your visit today? Courtney Holloway has provided verbal consent on 05/04/2022 for a virtual visit (video or telephone). Jannifer Rodney, FNP  Date: 05/04/2022 8:43 AM  Virtual Visit via Video Note   I, Jannifer Rodney, connected with  Courtney Holloway  ([id number], 19-24-2002) on 05/04/22 at  8:15 AM EDT by a video-enabled telemedicine application and verified that I am speaking with the correct person using two identifiers.  Location: Patient: Virtual Visit Location Patient:  Home Provider: Virtual Visit Location Provider: Home Office   I discussed the limitations of evaluation and management by telemedicine and the availability of in person appointments. The patient expressed understanding and agreed to proceed.    History of Present Illness: Courtney Holloway is a 21 y.o. who identifies as a female who was assigned female at birth, and is being seen today for rash on buttocks that started two weeks ago. She was seen in the Urgent Care and thought it was bug bite. She was told to use diaper cream and that has not helped.   She is also complaining of right outer ear pain that started 5 days ago and has worsen. States it hurst to even touch her ear. She reports an aching pain of 8 out 10. She taken tylenol with mild relief.   Complaining of right eye lid swelling for a week and half without improvement. Has used warm compression.   HPI: Rash This is a new problem. The current episode started 1 to 4 weeks ago. The problem has been gradually worsening since onset. The affected locations include the left buttock and right buttock. The rash is characterized by itchiness and redness. Pertinent negatives include no sore throat. Past treatments include anti-itch cream. The treatment provided mild relief.   Problems:  Patient Active Problem List   Diagnosis Date Noted   Elevated blood pressure reading without diagnosis of hypertension 01/13/2022   Enlarged tonsils 01/13/2022   Body mass index (BMI) of 39.0 to 39.9 in adult 09/02/2021  Irregular menses 08/19/2021   Prediabetes 08/04/2021   Nightmares associated with chronic post-traumatic stress disorder 04/01/2021   Food allergy 04/01/2021   Anal fissure 03/28/2021   Sleep disturbance 03/21/2021   Drug overdose, multiple drugs, undetermined intent, initial encounter 02/02/2021   Attention deficit hyperactivity disorder (ADHD), combined type 01/28/2021   MDD (major depressive disorder), recurrent severe, without psychosis  (HCC) 01/16/2021   HSV-1 (herpes simplex virus 1) infection 08/02/2020   Menorrhagia with regular cycle 07/05/2020   Dyslipidemia 02/15/2018   Hidradenitis axillaris 07/11/2016   Failed vision screen 11/21/2014   Allergic rhinitis 11/21/2014   Slow transit constipation 11/21/2014   Asthma 05/04/2013   GAD (generalized anxiety disorder) 11/02/2012   Post-traumatic stress disorder 11/02/2012    Allergies:  Allergies  Allergen Reactions   Other Anaphylaxis and Itching    Seasonal allergies, dog fur, certain detergents  Pt. Reports mother has used "epi-pen" for past allergies.    Pineapple Anaphylaxis   Medications:  Current Outpatient Medications:    amoxicillin-clavulanate (AUGMENTIN) 875-125 MG tablet, Take 1 tablet by mouth 2 (two) times daily., Disp: 14 tablet, Rfl: 0   bacitracin-polymyxin b (POLYSPORIN) ophthalmic ointment, Place 1 application. into the right eye 2 (two) times daily. apply to eye every 12 hours while awake, Disp: 3.5 g, Rfl: 0   albuterol (VENTOLIN HFA) 108 (90 Base) MCG/ACT inhaler, Inhale 2 puffs into the lungs every 4 (four) hours as needed for wheezing or shortness of breath., Disp: 18 g, Rfl: 1   EPINEPHrine (EPIPEN 2-PAK) 0.3 mg/0.3 mL IJ SOAJ injection, Inject 0.3 mg into the muscle as needed for anaphylaxis., Disp: 1 each, Rfl: 1   fluticasone (FLONASE) 50 MCG/ACT nasal spray, Place 2 sprays into both nostrils daily., Disp: 16 g, Rfl: 6   hydrocortisone ointment 0.5 %, Apply 1 application. topically 2 (two) times daily., Disp: 30 g, Rfl: 0   Insulin Pen Needle (INSUPEN PEN NEEDLES) 32G X 4 MM MISC, BD Pen Needles- use one for victoza, Disp: 90 each, Rfl: 3   lisdexamfetamine (VYVANSE) 40 MG capsule, Take 1 capsule (40 mg total) by mouth daily with breakfast., Disp: 30 capsule, Rfl: 0   triamcinolone ointment (KENALOG) 0.5 %, Apply 1 application topically 2 (two) times daily., Disp: 60 g, Rfl: 3   valACYclovir (VALTREX) 1000 MG tablet, Take 1 tablet (1,000 mg  total) by mouth daily., Disp: 30 tablet, Rfl: 3   VICTOZA 18 MG/3ML SOPN, Inject 1.2 mg into the skin daily., Disp: 6 mL, Rfl: 0  Observations/Objective: Patient is well-developed, well-nourished in no acute distress.  Resting comfortably at home.  Head is normocephalic, atraumatic.  No labored breathing.  Speech is clear and coherent with logical content.  Patient is alert and oriented at baseline.  Right upper lid swollen and erythemas  Right  outer ear slightly erythemas  Buttocks erythemas and excoriation in between folds  Assessment and Plan: 1. Acute otitis externa of right ear, unspecified type - amoxicillin-clavulanate (AUGMENTIN) 875-125 MG tablet; Take 1 tablet by mouth 2 (two) times daily.  Dispense: 14 tablet; Refill: 0  2. Hordeolum externum of right upper eyelid - bacitracin-polymyxin b (POLYSPORIN) ophthalmic ointment; Place 1 application. into the right eye 2 (two) times daily. apply to eye every 12 hours while awake  Dispense: 3.5 g; Refill: 0  3. Rash and nonspecific skin eruption  Keep clean and dry Use desitin BID Tylenol as needed  Warm compresses  Follow up if symptoms worsen or do not improve  Follow Up Instructions: I discussed the assessment and treatment plan with the patient. The patient was provided an opportunity to ask questions and all were answered. The patient agreed with the plan and demonstrated an understanding of the instructions.  A copy of instructions were sent to the patient via MyChart unless otherwise noted below.     The patient was advised to call back or seek an in-person evaluation if the symptoms worsen or if the condition fails to improve as anticipated.  Time:  I spent 12 minutes with the patient via telehealth technology discussing the above problems/concerns.    Jannifer Rodney, FNP

## 2022-05-05 ENCOUNTER — Other Ambulatory Visit: Payer: Self-pay | Admitting: Family

## 2022-05-18 ENCOUNTER — Encounter (HOSPITAL_COMMUNITY): Payer: Self-pay

## 2022-05-18 ENCOUNTER — Emergency Department (HOSPITAL_COMMUNITY): Payer: Medicaid Other

## 2022-05-18 ENCOUNTER — Other Ambulatory Visit: Payer: Self-pay

## 2022-05-18 ENCOUNTER — Emergency Department (HOSPITAL_COMMUNITY)
Admission: EM | Admit: 2022-05-18 | Discharge: 2022-05-18 | Disposition: A | Discharge status: Home / Self Care | Payer: Medicaid Other | Attending: Emergency Medicine | Admitting: Emergency Medicine

## 2022-05-18 DIAGNOSIS — Z794 Long term (current) use of insulin: Secondary | ICD-10-CM | POA: Diagnosis not present

## 2022-05-18 DIAGNOSIS — R110 Nausea: Secondary | ICD-10-CM | POA: Diagnosis not present

## 2022-05-18 DIAGNOSIS — J45909 Unspecified asthma, uncomplicated: Secondary | ICD-10-CM | POA: Insufficient documentation

## 2022-05-18 DIAGNOSIS — R1084 Generalized abdominal pain: Secondary | ICD-10-CM | POA: Insufficient documentation

## 2022-05-18 LAB — COMPREHENSIVE METABOLIC PANEL
ALT: 13 U/L (ref 0–44)
AST: 15 U/L (ref 15–41)
Albumin: 3.5 g/dL (ref 3.5–5.0)
Alkaline Phosphatase: 31 U/L — ABNORMAL LOW (ref 38–126)
Anion gap: 8 (ref 5–15)
BUN: 11 mg/dL (ref 6–20)
CO2: 21 mmol/L — ABNORMAL LOW (ref 22–32)
Calcium: 8.6 mg/dL — ABNORMAL LOW (ref 8.9–10.3)
Chloride: 107 mmol/L (ref 98–111)
Creatinine, Ser: 0.55 mg/dL (ref 0.44–1.00)
GFR, Estimated: >60 mL/min (ref >60)
Glucose, Bld: 97 mg/dL (ref 70–99)
Potassium: 3.9 mmol/L (ref 3.5–5.1)
Sodium: 136 mmol/L (ref 135–145)
Total Bilirubin: 0.6 mg/dL (ref 0.3–1.2)
Total Protein: 6.8 g/dL (ref 6.5–8.1)

## 2022-05-18 LAB — CBC
HCT: 36.4 % (ref 36.0–46.0)
Hemoglobin: 11.9 g/dL — ABNORMAL LOW (ref 12.0–15.0)
MCH: 27.2 pg (ref 26.0–34.0)
MCHC: 32.7 g/dL (ref 30.0–36.0)
MCV: 83.3 fL (ref 80.0–100.0)
Platelets: 421 10*3/uL — ABNORMAL HIGH (ref 150–400)
RBC: 4.37 MIL/uL (ref 3.87–5.11)
RDW: 15.6 % — ABNORMAL HIGH (ref 11.5–15.5)
WBC: 9.3 10*3/uL (ref 4.0–10.5)
nRBC: 0 % (ref 0.0–0.2)

## 2022-05-18 LAB — URINALYSIS, ROUTINE W REFLEX MICROSCOPIC
Bilirubin Urine: NEGATIVE
Glucose, UA: NEGATIVE mg/dL
Hgb urine dipstick: NEGATIVE
Ketones, ur: NEGATIVE mg/dL
Nitrite: NEGATIVE
Protein, ur: NEGATIVE mg/dL
Specific Gravity, Urine: 1.017 (ref 1.005–1.030)
pH: 7 (ref 5.0–8.0)

## 2022-05-18 LAB — I-STAT BETA HCG BLOOD, ED (MC, WL, AP ONLY): I-stat hCG, quantitative: >2000 m[IU]/mL — ABNORMAL HIGH (ref <5)

## 2022-05-18 LAB — LIPASE, BLOOD: Lipase: 25 U/L (ref 11–51)

## 2022-05-18 LAB — HCG, QUANTITATIVE, PREGNANCY: hCG, Beta Chain, Quant, S: 23274 m[IU]/mL — ABNORMAL HIGH (ref <5)

## 2022-05-18 NOTE — ED Notes (Signed)
AVS provided to and discussed with patient. Pt verbalizes understanding of discharge instructions and denies any questions or concerns at this time. Pt ambulated out of department independently with steady gait.  

## 2022-05-18 NOTE — ED Triage Notes (Addendum)
Patient reports that she began having intermittent abdominal pain and nausea x 7 days. Patient reports that she has had 5 positive home pregnancy tests. Patient denies any vaginal bleeding.

## 2022-05-18 NOTE — ED Notes (Signed)
PA at bedside updating pt on test results and plan of care.

## 2022-05-18 NOTE — ED Provider Notes (Signed)
[facility] DEPT Provider Note   CSN: [id number] Arrival date & time: 05/18/22  0720     History  Chief Complaint  Patient presents with   Nausea   Abdominal Pain    Courtney Holloway is a 21 y.o. female.  21 year old female presents today for evaluation of 2-week duration of fatigue, several day duration of nausea without vomiting, abdominal cramping, and low back pain.  She denies vaginal bleeding, vaginal discharge, dysuria.  She states since yesterday she has had 5 positive home pregnancy tests.  She denies other complaints.  She wanted to come into the emergency room to have repeat pregnancy test and be evaluated for abdominal pain.  Abdominal pain is described as cramping in the lower abdomen that is intermittent a couple times a day.  The history is provided by the patient. No language interpreter was used.      Home Medications Prior to Admission medications   Medication Sig Start Date End Date Taking? Authorizing Provider  albuterol (VENTOLIN HFA) 108 (90 Base) MCG/ACT inhaler Inhale 2 puffs into the lungs every 4 (four) hours as needed for wheezing or shortness of breath. 06/12/21   Padgett, Rae Halsted, MD  amoxicillin-clavulanate (AUGMENTIN) 875-125 MG tablet Take 1 tablet by mouth 2 (two) times daily. 05/04/22   Sharion Balloon, FNP  bacitracin-polymyxin b (POLYSPORIN) ophthalmic ointment Place 1 application. into the right eye 2 (two) times daily. apply to eye every 12 hours while awake 05/04/22   Hawks, [place] A, FNP  clindamycin (CLEOCIN T) 1 % lotion Apply topically 2 (two) times daily. Use to affected areas in arm pits. 05/04/22   Parthenia Ames, NP  EPINEPHrine (EPIPEN 2-PAK) 0.3 mg/0.3 mL IJ SOAJ injection Inject 0.3 mg into the muscle as needed for anaphylaxis. 03/12/21   Rae Lips, MD  fluticasone (FLONASE) 50 MCG/ACT nasal spray Place 2 sprays into both nostrils daily. 04/18/22   Evelina Dun A, FNP  hydrocortisone  ointment 0.5 % Apply 1 application. topically 2 (two) times daily. 05/02/22   Parthenia Ames, NP  Insulin Pen Needle (INSUPEN PEN NEEDLES) 32G X 4 MM MISC BD Pen Needles- use one for victoza 01/13/22   Trude Mcburney, FNP  levocetirizine (XYZAL) 5 MG tablet Take 1 tablet (5 mg total) by mouth every evening. 05/04/22   Parthenia Ames, NP  lisdexamfetamine (VYVANSE) 40 MG capsule Take 1 capsule (40 mg total) by mouth daily with breakfast. 04/16/22   Trude Mcburney, FNP  triamcinolone ointment (KENALOG) 0.5 % Apply 1 application topically 2 (two) times daily. 01/13/22   Trude Mcburney, FNP  valACYclovir (VALTREX) 1000 MG tablet Take 1 tablet (1,000 mg total) by mouth daily. 11/14/21   Trude Mcburney, FNP  VICTOZA 18 MG/3ML SOPN Inject 1.2 mg into the skin daily. 01/13/22 05/02/22  Trude Mcburney, FNP      Allergies    Other and Pineapple    Review of Systems   Review of Systems  Constitutional:  Negative for appetite change, chills and fever.  Gastrointestinal:  Positive for abdominal pain and nausea. Negative for vomiting.  Genitourinary:  Negative for dysuria, vaginal bleeding and vaginal discharge.  All other systems reviewed and are negative.  Physical Exam Updated Vital Signs BP 132/79   Pulse 94   Temp 97.7 F (36.5 C) (Oral)   Resp 16   Ht 5' 2.25" (1.581 m)   Wt 93.9 kg   LMP 04/01/2022 (Exact Date)   SpO2  99%   BMI 37.56 kg/m  Physical Exam Vitals and nursing note reviewed.  Constitutional:      General: She is not in acute distress.    Appearance: Normal appearance. She is not ill-appearing.  HENT:     Head: Normocephalic and atraumatic.     Nose: Nose normal.  Eyes:     General: No scleral icterus.    Extraocular Movements: Extraocular movements intact.     Conjunctiva/sclera: Conjunctivae normal.  Cardiovascular:     Rate and Rhythm: Normal rate and regular rhythm.     Pulses: Normal pulses.     Heart sounds: Normal heart sounds.  Pulmonary:      Effort: Pulmonary effort is normal. No respiratory distress.     Breath sounds: Normal breath sounds. No wheezing or rales.  Abdominal:     General: There is no distension.     Palpations: Abdomen is soft.     Tenderness: There is no abdominal tenderness. There is no right CVA tenderness, left CVA tenderness or guarding.  Musculoskeletal:        General: Normal range of motion.     Cervical back: Normal range of motion.  Skin:    General: Skin is warm and dry.  Neurological:     General: No focal deficit present.     Mental Status: She is alert. Mental status is at baseline.    ED Results / Procedures / Treatments   Labs (all labs ordered are listed, but only abnormal results are displayed) Labs Reviewed  CBC - Abnormal; Notable for the following components:      Result Value   Hemoglobin 11.9 (*)    RDW 15.6 (*)    Platelets 421 (*)    All other components within normal limits  URINALYSIS, ROUTINE W REFLEX MICROSCOPIC - Abnormal; Notable for the following components:   APPearance HAZY (*)    Leukocytes,Ua TRACE (*)    Bacteria, UA RARE (*)    All other components within normal limits  I-STAT BETA HCG BLOOD, ED ([facility], [facility], [facility] ONLY) - Abnormal; Notable for the following components:   I-stat hCG, quantitative >2,000.0 (*)    All other components within normal limits  HCG, QUANTITATIVE, PREGNANCY  COMPREHENSIVE METABOLIC PANEL  LIPASE, BLOOD    EKG None  Radiology No results found.  Procedures Procedures    Medications Ordered in ED Medications - No data to display  ED Course/ Medical Decision Making/ A&P                           Medical Decision Making Amount and/or Complexity of Data Reviewed Labs: ordered. Radiology: ordered.   Medical Decision Making / ED Course   This patient presents to the ED for concern of abdominal pain, this involves an extensive number of treatment options, and is a complaint that carries with it a high risk of complications and  morbidity.  The differential diagnosis includes ectopic pregnancy, miscarriage, appendicitis, cholecystitis, gastroenteritis  MDM: 21 year old female presents today for evaluation of 2-week duration of fatigue, several day duration of nausea, vomiting, abdominal cramping, and low back pain.  Without other associated symptoms.  She appears well.  Abdominal cramping occurs intermittently and has about 2 episodes per day.  Will evaluate with basic blood work, hCG, and OB ultrasound. CBC without leukocytosis.  Hemoglobin of 11.9 without signs or symptoms of active bleed.  UA without evidence of UTI.  CMP unremarkable.  hCG of  23,000 274.  Lipase within normal limits.  OB ultrasound without visible intrauterine pregnancy however also without ectopic or other concerning findings.  Discussed findings with patient.  Patient is appropriate for discharge.  Discharged in stable condition.  OB referral provided.  She also does not have PCP.  [facility] information provided.  Lab Tests: -I ordered, reviewed, and interpreted labs.   The pertinent results include:   Labs Reviewed  CBC - Abnormal; Notable for the following components:      Result Value   Hemoglobin 11.9 (*)    RDW 15.6 (*)    Platelets 421 (*)    All other components within normal limits  URINALYSIS, ROUTINE W REFLEX MICROSCOPIC - Abnormal; Notable for the following components:   APPearance HAZY (*)    Leukocytes,Ua TRACE (*)    Bacteria, UA RARE (*)    All other components within normal limits  HCG, QUANTITATIVE, PREGNANCY - Abnormal; Notable for the following components:   hCG, Beta Chain, Quant, S 23,274 (*)    All other components within normal limits  COMPREHENSIVE METABOLIC PANEL - Abnormal; Notable for the following components:   CO2 21 (*)    Calcium 8.6 (*)    Alkaline Phosphatase 31 (*)    All other components within normal limits  I-STAT BETA HCG BLOOD, ED ([facility], [facility], [facility] ONLY) - Abnormal;  Notable for the following components:   I-stat hCG, quantitative >2,000.0 (*)    All other components within normal limits  LIPASE, BLOOD      EKG  EKG Interpretation  Date/Time:    Ventricular Rate:    PR Interval:    QRS Duration:   QT Interval:    QTC Calculation:   R Axis:     Text Interpretation:           Imaging Studies ordered: I ordered imaging studies including OB ultrasound I independently visualized and interpreted imaging. I agree with the radiologist interpretation   Medicines ordered and prescription drug management: No orders of the defined types were placed in this encounter.   -I have reviewed the patients home medicines and have made adjustments as needed  Reevaluation: After the interventions noted above, I reevaluated the patient and found that they have :improved  Co morbidities that complicate the patient evaluation  Past Medical History:  Diagnosis Date   Allergy    Anxiety    [employer]reesia 10/14/2020   Asthma    Asthma    [employer]reesia 10/14/2020   Depression    Depression    [employer]reesia 10/14/2020   DMDD (disruptive mood dysregulation disorder) ([place]) 12/28/2015   Failed vision screen 11/21/2014   GAD (generalized anxiety disorder)    Headache(784.0)    Mental disorder    Obesity 11/21/2014   Vision abnormalities    wears glasses      Dispostion: Patient is appropriate for discharge.  Discharged in stable condition.  Return precautions discussed.   Final Clinical Impression(s) / ED Diagnoses Final diagnoses:  Generalized abdominal pain    Rx / DC Orders ED Discharge Orders     None         Evlyn Courier, PA-C 05/18/22 [place], [place], DO 05/18/22 1433

## 2022-05-18 NOTE — ED Notes (Signed)
US at bedside

## 2022-05-18 NOTE — Discharge Instructions (Signed)
Your work-up today was reassuring.  No signs of concerning cause of abdominal pain.  Your ultrasound did not show any concerning findings.  I have attached OB follow-up above for you.  Of also attached [facility] for you to establish primary care with given you do not have a current primary care provider.  Please call OB clinic tomorrow to schedule a follow-up appointment.  You do need to be seen within next 2 weeks for repeat blood work and ultrasound.  If you have any worsening symptoms return to the emergency room.

## 2022-05-22 ENCOUNTER — Encounter: Payer: Self-pay | Admitting: Family

## 2022-05-24 ENCOUNTER — Telehealth: Payer: Medicaid Other | Admitting: Nurse Practitioner

## 2022-05-24 DIAGNOSIS — O218 Other vomiting complicating pregnancy: Secondary | ICD-10-CM

## 2022-05-24 NOTE — Patient Instructions (Signed)
Hyperemesis Gravidarum Hyperemesis gravidarum is a severe form of nausea and vomiting that happens during pregnancy. Hyperemesis is worse than morning sickness. It may cause you to have nausea or vomiting all day for many days. It may keep you from eating and drinking enough food and liquids, which can lead to dehydration, malnutrition, and weight loss. Hyperemesis usually occurs during the first half (the first 20 weeks) of pregnancy. It often goes away once a woman is in her second half of pregnancy. However, sometimes hyperemesis continues through an entire pregnancy. What are the causes? The cause of this condition is not known. It may be associated with: Changes in hormones in the body during pregnancy. Changes in the gastrointestinal system. Genetic or inherited conditions. What are the signs or symptoms? Symptoms of this condition include: Severe nausea and vomiting that does not go away. Problems keeping food down. Weight loss. Loss of body fluid (dehydration). Loss of appetite. You may have no desire to eat or you may not like the food you have previously enjoyed. How is this diagnosed? This condition may be diagnosed based on your medical history, your symptoms, and a physical exam. You may also have other tests, including: Blood tests. Urine tests. Blood pressure tests. Ultrasound to look for problems with the placenta or to check if you are pregnant with more than one baby. How is this treated? This condition is managed by controlling symptoms. This may include: Following an eating plan. This can help to lessen nausea and vomiting. Treatments that do not use medicine. These include acupressure bracelets, hypnosis, and eating or drinking foods or fluids that contain ginger, ginger ale, or ginger tea. Taking prescription medicine or over-the-counter medicine as told by your health care provider. Continuing to take prenatal vitamins. You may need to change what kind you take and  when you take them. Follow your health care provider's instructions about prenatal vitamins. An eating plan and medicines are often used together to help control symptoms. If medicines do not help relieve nausea and vomiting, you may need to receive fluids through an IV at the hospital. Follow these instructions at home: To help relieve your symptoms, listen to your body. Everyone is different and has different preferences. Find what works best for you. Here are some things you can try to help relieve your symptoms: Meals and snacks  Eat 5-6 small meals daily instead of 3 large meals. Eating small meals and snacks can help you avoid an empty stomach. Before getting out of bed, eat a couple of crackers to avoid moving around on an empty stomach. Eat a protein-rich snack before bed. Examples include cheese and crackers, or a peanut butter sandwich made with 1 slice of whole-wheat bread and 1 tsp (5 g) of peanut butter. Eat and drink slowly. Try eating starchy foods as these are usually tolerated well. Examples include cereal, toast, bread, potatoes, pasta, rice, and pretzels. Eat at least one serving of protein with your meals and snacks. Protein options include lean meats, poultry, seafood, beans, nuts, nut butters, eggs, cheese, and yogurt. Eat or suck on things that have ginger in them. It may help to relieve nausea. Add  tsp (0.44 g) ground ginger to hot tea, or choose ginger tea. Fluids It is important to stay hydrated. Try to: Drink small amounts of fluids often. Drink fluids 30 minutes before or after a meal to help lessen the feeling of a full stomach. Drink 100% fruit juice or an electrolyte drink. An electrolyte drink contains  sodium, potassium, and chloride. Drink fluids that are cold, clear, and carbonated or sour. These include lemonade, ginger ale, lemon-lime soda, ice water, and sparkling water. Things to avoid Avoid the following: Eating foods that trigger your symptoms. These may  include spicy foods, coffee, high-fat foods, very sweet foods, and acidic foods. Drinking more than 1 cup of fluid at a time. Skipping meals. Nausea can be more intense on an empty stomach. If you cannot tolerate food, do not force it. Try sucking on ice chips or other frozen items and make up for missed calories later. Lying down within 2 hours after eating. Being exposed to environmental triggers. These may include food smells, smoky rooms, closed spaces, rooms with strong smells, warm or humid places, overly loud and noisy rooms, and rooms with motion or flickering lights. Try eating meals in a well-ventilated area that is free of strong smells. Making quick and sudden changes in your movement. Taking iron pills and multivitamins that contain iron. If you take prescription iron pills, do not stop taking them unless your health care provider approves. Preparing food. The smell of food can spoil your appetite or trigger nausea. General instructions Brush your teeth or use a mouth rinse after meals. Take over-the-counter and prescription medicines only as told by your health care provider. Follow instructions from your health care provider about eating or drinking restrictions. Talk with your health care provider about starting a supplement of vitamin B6. Continue to take your prenatal vitamins as told by your health care provider. If you are having trouble taking your prenatal vitamins, talk with your health care provider about other options. Keep all follow-up visits. This is important. Follow-up visits include prenatal visits. Contact a health care provider if: You have pain in your abdomen. You have a severe headache. You have vision problems. You are losing weight. You feel weak or dizzy. You cannot eat or drink without vomiting, especially if this goes on for a full day. Get help right away if: You cannot drink fluids without vomiting. You vomit blood. You have constant nausea and  vomiting. You are very weak. You faint. You have a fever and your symptoms suddenly get worse. Summary Hyperemesis gravidarum is a severe form of nausea and vomiting that happens during pregnancy. Making some changes to your eating habits may help relieve nausea and vomiting. This condition may be managed with lifestyle changes and medicines as prescribed by your health care provider. If medicines do not help relieve nausea and vomiting, you may need to receive fluids through an IV at the hospital. This information is not intended to replace advice given to you by your health care provider. Make sure you discuss any questions you have with your health care provider. Document Revised: 06/25/2020 Document Reviewed: 06/25/2020 Elsevier Patient Education  2023 Elsevier Inc.  

## 2022-05-24 NOTE — Progress Notes (Signed)
Virtual Visit Consent   Courtney Holloway, you are scheduled for a virtual visit with Mary-Margaret Daphine Deutscher, FNP, a [facility] provider, today.     Just as with appointments in the office, your consent must be obtained to participate.  Your consent will be active for this visit and any virtual visit you may have with one of our providers in the next 365 days.     If you have a MyChart account, a copy of this consent can be sent to you electronically.  All virtual visits are billed to your insurance company just like a traditional visit in the office.    As this is a virtual visit, video technology does not allow for your provider to perform a traditional examination.  This may limit your provider's ability to fully assess your condition.  If your provider identifies any concerns that need to be evaluated in person or the need to arrange testing (such as labs, EKG, etc.), we will make arrangements to do so.     Although advances in technology are sophisticated, we cannot ensure that it will always work on either your end or our end.  If the connection with a video visit is poor, the visit may have to be switched to a telephone visit.  With either a video or telephone visit, we are not always able to ensure that we have a secure connection.     I need to obtain your verbal consent now.   Are you willing to proceed with your visit today? YES   Courtney Holloway has provided verbal consent on 05/24/2022 for a virtual visit (video or telephone).   Mary-Margaret Daphine Deutscher, FNP   Date: 05/24/2022 9:25 AM   Virtual Visit via Video Note   I, Mary-Margaret Daphine Deutscher, connected with Courtney Holloway ([id number], 10/19/2001) on 05/24/22 at  9:30 AM EDT by a video-enabled telemedicine application and verified that I am speaking with the correct person using two identifiers.  Location: Patient: Virtual Visit Location Patient: Home Provider: Virtual Visit Location Provider: Mobile   I discussed the limitations  of evaluation and management by telemedicine and the availability of in person appointments. The patient expressed understanding and agreed to proceed.    History of Present Illness: Courtney Holloway is a 21 y.o. who identifies as a female who was assigned female at birth, and is being seen today for vomiting during pregnancy.  HPI: Patient states sheis [redacted] weeks pregnant and has not been able to keep food or liquids down for th elast few days. Having some trouble sleeping. She has tried B6 vitamins OTC with no relief.    Review of Systems  Gastrointestinal:  Positive for nausea and vomiting. Negative for abdominal pain, constipation and diarrhea.    Problems:  Patient Active Problem List   Diagnosis Date Noted   Elevated blood pressure reading without diagnosis of hypertension 01/13/2022   Enlarged tonsils 01/13/2022   Body mass index (BMI) of 39.0 to 39.9 in adult 09/02/2021   Irregular menses 08/19/2021   Prediabetes 08/04/2021   Nightmares associated with chronic post-traumatic stress disorder 04/01/2021   Food allergy 04/01/2021   Anal fissure 03/28/2021   Sleep disturbance 03/21/2021   Drug overdose, multiple drugs, undetermined intent, initial encounter 02/02/2021   Attention deficit hyperactivity disorder (ADHD), combined type 01/28/2021   MDD (major depressive disorder), recurrent severe, without psychosis (HCC) 01/16/2021   HSV-1 (herpes simplex virus 1) infection 08/02/2020   Menorrhagia with regular cycle 07/05/2020  Dyslipidemia 02/15/2018   Hidradenitis axillaris 07/11/2016   Failed vision screen 11/21/2014   Allergic rhinitis 11/21/2014   Slow transit constipation 11/21/2014   Asthma 05/04/2013   GAD (generalized anxiety disorder) 11/02/2012   Post-traumatic stress disorder 11/02/2012    Allergies:  Allergies  Allergen Reactions   Other Anaphylaxis and Itching    Seasonal allergies, dog fur, certain detergents  Pt. Reports mother has used "epi-pen" for past  allergies.    Pineapple Anaphylaxis   Medications:  Current Outpatient Medications:    albuterol (VENTOLIN HFA) 108 (90 Base) MCG/ACT inhaler, Inhale 2 puffs into the lungs every 4 (four) hours as needed for wheezing or shortness of breath., Disp: 18 g, Rfl: 1   amoxicillin-clavulanate (AUGMENTIN) 875-125 MG tablet, Take 1 tablet by mouth 2 (two) times daily., Disp: 14 tablet, Rfl: 0   bacitracin-polymyxin b (POLYSPORIN) ophthalmic ointment, Place 1 application. into the right eye 2 (two) times daily. apply to eye every 12 hours while awake, Disp: 3.5 g, Rfl: 0   clindamycin (CLEOCIN T) 1 % lotion, Apply topically 2 (two) times daily. Use to affected areas in arm pits., Disp: 60 mL, Rfl: 0   EPINEPHrine (EPIPEN 2-PAK) 0.3 mg/0.3 mL IJ SOAJ injection, Inject 0.3 mg into the muscle as needed for anaphylaxis., Disp: 1 each, Rfl: 1   fluticasone (FLONASE) 50 MCG/ACT nasal spray, Place 2 sprays into both nostrils daily., Disp: 16 g, Rfl: 6   hydrocortisone ointment 0.5 %, Apply 1 application. topically 2 (two) times daily., Disp: 30 g, Rfl: 0   Insulin Pen Needle (INSUPEN PEN NEEDLES) 32G X 4 MM MISC, BD Pen Needles- use one for victoza, Disp: 90 each, Rfl: 3   levocetirizine (XYZAL) 5 MG tablet, Take 1 tablet (5 mg total) by mouth every evening., Disp: 90 tablet, Rfl: 0   lisdexamfetamine (VYVANSE) 40 MG capsule, Take 1 capsule (40 mg total) by mouth daily with breakfast., Disp: 30 capsule, Rfl: 0   triamcinolone ointment (KENALOG) 0.5 %, Apply 1 application topically 2 (two) times daily., Disp: 60 g, Rfl: 3   valACYclovir (VALTREX) 1000 MG tablet, Take 1 tablet (1,000 mg total) by mouth daily., Disp: 30 tablet, Rfl: 3   VICTOZA 18 MG/3ML SOPN, Inject 1.2 mg into the skin daily., Disp: 6 mL, Rfl: 0  Observations/Objective: Patient is well-developed, well-nourished in no acute distress.  Resting comfortably at home.  Head is normocephalic, atraumatic.  No labored breathing. Speech is clear and  coherent with logical content.  Patient is alert and oriented at baseline.   Assessment and Plan:  Courtney Holloway in today with chief complaint of Emesis During Pregnancy   1. Vomiting due to organic disease during pregnancy First 24 Hours-Clear liquids  popsicles  Jello  gatorade  Sprite Second 24 hours-Add Full liquids ( Liquids you cant see through) Third 24 hours- Bland diet ( foods that are baked or broiled)  *avoiding fried foods and highly spiced foods* During these 3 days  Avoid milk, cheese, ice cream or any other dairy products  Avoid caffeine- REMEMBER Mt. Dew and Mello Yellow contain lots of caffeine You should eat and drink in  Frequent small volumes If no improvement in symptoms or worsen in 2-3 days should RETRUN TO OFFICE or go to ER!   Add unisom OTC to b6.  If this doe snot help you will need to call your OB on Monday morning.  Lost connection near end of viist- unable to reconnect with patient.  Follow Up  Instructions: I discussed the assessment and treatment plan with the patient. The patient was provided an opportunity to ask questions and all were answered. The patient agreed with the plan and demonstrated an understanding of the instructions.  A copy of instructions were sent to the patient via MyChart.  The patient was advised to call back or seek an in-person evaluation if the symptoms worsen or if the condition fails to improve as anticipated.  Time:  I spent 7 minutes with the patient via telehealth technology discussing the above problems/concerns.    Mary-Margaret Daphine DeutscherMartin, FNP

## 2022-05-25 ENCOUNTER — Other Ambulatory Visit: Payer: Self-pay

## 2022-05-25 ENCOUNTER — Emergency Department (HOSPITAL_COMMUNITY)
Admission: EM | Admit: 2022-05-25 | Discharge: 2022-05-25 | Disposition: A | Discharge status: Home / Self Care | Payer: Medicaid Other | Attending: Emergency Medicine | Admitting: Emergency Medicine

## 2022-05-25 ENCOUNTER — Encounter (HOSPITAL_COMMUNITY): Payer: Self-pay

## 2022-05-25 DIAGNOSIS — O99111 Other diseases of the blood and blood-forming organs and certain disorders involving the immune mechanism complicating pregnancy, first trimester: Secondary | ICD-10-CM | POA: Insufficient documentation

## 2022-05-25 DIAGNOSIS — Z3A01 Less than 8 weeks gestation of pregnancy: Secondary | ICD-10-CM | POA: Insufficient documentation

## 2022-05-25 DIAGNOSIS — D72829 Elevated white blood cell count, unspecified: Secondary | ICD-10-CM | POA: Diagnosis not present

## 2022-05-25 DIAGNOSIS — R109 Unspecified abdominal pain: Secondary | ICD-10-CM | POA: Insufficient documentation

## 2022-05-25 DIAGNOSIS — O219 Vomiting of pregnancy, unspecified: Secondary | ICD-10-CM | POA: Insufficient documentation

## 2022-05-25 DIAGNOSIS — O26891 Other specified pregnancy related conditions, first trimester: Secondary | ICD-10-CM | POA: Diagnosis not present

## 2022-05-25 LAB — URINALYSIS, ROUTINE W REFLEX MICROSCOPIC
Bacteria, UA: NONE SEEN
Bilirubin Urine: NEGATIVE
Glucose, UA: NEGATIVE mg/dL
Hgb urine dipstick: NEGATIVE
Ketones, ur: NEGATIVE mg/dL
Nitrite: NEGATIVE
Protein, ur: NEGATIVE mg/dL
Specific Gravity, Urine: 1.017 (ref 1.005–1.030)
pH: 6 (ref 5.0–8.0)

## 2022-05-25 LAB — CBC
HCT: 35 % — ABNORMAL LOW (ref 36.0–46.0)
Hemoglobin: 11.6 g/dL — ABNORMAL LOW (ref 12.0–15.0)
MCH: 27.6 pg (ref 26.0–34.0)
MCHC: 33.1 g/dL (ref 30.0–36.0)
MCV: 83.1 fL (ref 80.0–100.0)
Platelets: 381 10*3/uL (ref 150–400)
RBC: 4.21 MIL/uL (ref 3.87–5.11)
RDW: 15.4 % (ref 11.5–15.5)
WBC: 12 10*3/uL — ABNORMAL HIGH (ref 4.0–10.5)
nRBC: 0 % (ref 0.0–0.2)

## 2022-05-25 LAB — COMPREHENSIVE METABOLIC PANEL
ALT: 14 U/L (ref 0–44)
AST: 17 U/L (ref 15–41)
Albumin: 3.9 g/dL (ref 3.5–5.0)
Alkaline Phosphatase: 33 U/L — ABNORMAL LOW (ref 38–126)
Anion gap: 7 (ref 5–15)
BUN: 10 mg/dL (ref 6–20)
CO2: 22 mmol/L (ref 22–32)
Calcium: 8.8 mg/dL — ABNORMAL LOW (ref 8.9–10.3)
Chloride: 107 mmol/L (ref 98–111)
Creatinine, Ser: 0.67 mg/dL (ref 0.44–1.00)
GFR, Estimated: >60 mL/min (ref >60)
Glucose, Bld: 125 mg/dL — ABNORMAL HIGH (ref 70–99)
Potassium: 3.6 mmol/L (ref 3.5–5.1)
Sodium: 136 mmol/L (ref 135–145)
Total Bilirubin: 0.5 mg/dL (ref 0.3–1.2)
Total Protein: 7.5 g/dL (ref 6.5–8.1)

## 2022-05-25 LAB — LIPASE, BLOOD: Lipase: 25 U/L (ref 11–51)

## 2022-05-25 LAB — HCG, QUANTITATIVE, PREGNANCY: hCG, Beta Chain, Quant, S: 84845 m[IU]/mL — ABNORMAL HIGH (ref <5)

## 2022-05-25 MED ORDER — SODIUM CHLORIDE 0.9 % IV BOLUS
1000.0000 mL | Freq: Once | INTRAVENOUS | Status: AC
  Administered 2022-05-25: 1000 mL via INTRAVENOUS

## 2022-05-25 MED ORDER — METOCLOPRAMIDE HCL 5 MG/ML IJ SOLN
10.0000 mg | Freq: Once | INTRAMUSCULAR | Status: AC
  Administered 2022-05-25: 10 mg via INTRAVENOUS
  Filled 2022-05-25: qty 2

## 2022-05-25 MED ORDER — ONDANSETRON HCL 4 MG/2ML IJ SOLN
4.0000 mg | Freq: Once | INTRAMUSCULAR | Status: AC
  Administered 2022-05-25: 4 mg via INTRAVENOUS
  Filled 2022-05-25: qty 2

## 2022-05-25 MED ORDER — METOCLOPRAMIDE HCL 10 MG PO TABS: 10.0000 mg | ORAL_TABLET | Freq: Three times a day (TID) | ORAL | 0 refills | Status: DC | PRN

## 2022-05-25 NOTE — ED Triage Notes (Signed)
Ambulatory to ED with c/o emesis while pregnant while also having intermittent cramping x 2 days. States she can't keep anything down.

## 2022-05-25 NOTE — ED Notes (Signed)
Patient reports improved nausea.  Will allow IV fluids to continue infusing and then d/c.

## 2022-05-25 NOTE — ED Notes (Signed)
Patient reports decreased nausea at this time.

## 2022-05-25 NOTE — Discharge Instructions (Addendum)
It's important to stay hydrated.  Take medications as prescribed.  If you have worsening symptoms, I recommend that you follow-up at the [facility] and [facility] at [facility].

## 2022-05-25 NOTE — ED Provider Notes (Signed)
[facility]-EMERGENCY DEPT [facility] Emergency Department Provider Note MRN:  [id number]  Arrival date & time: 05/25/22     Chief Complaint   Emesis During Pregnancy   History of Present Illness   Courtney Holloway is a 21 y.o. year-old female presents to the ED with chief complaint of nausea and vomiting and abdominal cramps.  She states that she is about [redacted] weeks pregnant.  She states she has been having the symptoms for the past week or so.  Was seen on 6/4 for the same.  States that she has tried OTC home remedies with no relief.  States that she feels dehydrated and cannot keep anything down.  Denies any fever or chills.  Denies any other associated symptoms.  History provided by patient.   Review of Systems  Pertinent review of systems noted in HPI.    Physical Exam   Vitals:   05/25/22 0134 05/25/22 0405  BP: (!) 134/97 130/80  Pulse: (!) 111 87  Resp: 20 18  Temp: 98.7 F (37.1 C)   SpO2: 94% 97%    CONSTITUTIONAL:  well-appearing, NAD NEURO:  Alert and oriented x 3, CN 3-12 grossly intact EYES:  eyes equal and reactive ENT/NECK:  Supple, no stridor  CARDIO:  tachycardic, regular rhythm, appears well-perfused  PULM:  No respiratory distress,  GI/GU:  non-distended,  MSK/SPINE:  No gross deformities, no edema, moves all extremities  SKIN:  no rash, atraumatic   *Additional and/or pertinent findings included in MDM below  Diagnostic and Interventional Summary    EKG Interpretation  Date/Time:    Ventricular Rate:    PR Interval:    QRS Duration:   QT Interval:    QTC Calculation:   R Axis:     Text Interpretation:         Labs Reviewed  COMPREHENSIVE METABOLIC PANEL - Abnormal; Notable for the following components:      Result Value   Glucose, Bld 125 (*)    Calcium 8.8 (*)    Alkaline Phosphatase 33 (*)    All other components within normal limits  CBC - Abnormal; Notable for the following components:   WBC 12.0 (*)    Hemoglobin 11.6 (*)     HCT 35.0 (*)    All other components within normal limits  URINALYSIS, ROUTINE W REFLEX MICROSCOPIC - Abnormal; Notable for the following components:   Leukocytes,Ua MODERATE (*)    All other components within normal limits  HCG, QUANTITATIVE, PREGNANCY - Abnormal; Notable for the following components:   hCG, Beta Chain, Quant, S 97,673 (*)    All other components within normal limits  LIPASE, BLOOD    No orders to display    Medications  sodium chloride 0.9 % bolus 1,000 mL (0 mLs Intravenous Stopped 05/25/22 0355)  ondansetron (ZOFRAN) injection 4 mg (4 mg Intravenous Given 05/25/22 0230)  metoCLOPramide (REGLAN) injection 10 mg (10 mg Intravenous Given 05/25/22 0407)  sodium chloride 0.9 % bolus 1,000 mL (1,000 mLs Intravenous New Bag/Given 05/25/22 0406)     Procedures  /  Critical Care Procedures  ED Course and Medical Decision Making  I have reviewed the triage vital signs, the nursing notes, and pertinent available records from the EMR.  Social Determinants Affecting Complexity of Care: Patient has no clinically significant social determinants affecting this chief complaint..   ED Course:   Patient here with nausea and vomiting in pregnancy.  Top differential diagnoses include hyperemesis, morning sickness, viral gastro. Medical Decision Making  Patient here with nausea and vomiting in pregnancy.  She states she is about [redacted] weeks pregnant.  Had ultrasound 7 days ago that was consistent with early intrauterine pregnancy.  hCG is uptrending.  Patient no longer vomiting and feels improved in the ED.  Will discharge with outpatient follow-up.  Problems Addressed: Nausea/vomiting in pregnancy: acute illness or injury  Amount and/or Complexity of Data Reviewed Labs: ordered.    Details: Mildly elevated leukocytosis at 12.0, urinalysis shows some white blood cells moderate leuks, no bacteria, will send for culture.  Patient not having any urinary symptoms.  hCG is  uptrending.  Risk Prescription drug management.     Consultants: No consultations were needed in caring for this patient.   Treatment and Plan: Emergency department workup does not suggest an emergent condition requiring admission or immediate intervention beyond  what has been performed at this time. The patient is safe for discharge and has  been instructed to return immediately for worsening symptoms, change in  symptoms or any other concerns    Final Clinical Impressions(s) / ED Diagnoses     ICD-10-CM   1. Nausea/vomiting in pregnancy  O21.9       ED Discharge Orders          Ordered    metoCLOPramide (REGLAN) 10 MG tablet  Every 8 hours PRN        05/25/22 0456              Discharge Instructions Discussed with and Provided to Patient:     Discharge Instructions      It's important to stay hydrated.  Take medications as prescribed.  If you have worsening symptoms, I recommend that you follow-up at the [facility] and [facility] Center at [facility].       Roxy Horseman, PA-C 05/25/22 0500    Mesner, Barbara Cower, MD 05/25/22 [phone]

## 2022-05-26 ENCOUNTER — Encounter (HOSPITAL_COMMUNITY): Payer: Self-pay | Admitting: Family Medicine

## 2022-05-26 ENCOUNTER — Other Ambulatory Visit: Payer: Self-pay

## 2022-05-26 ENCOUNTER — Other Ambulatory Visit: Payer: Self-pay | Admitting: Certified Nurse Midwife

## 2022-05-26 ENCOUNTER — Inpatient Hospital Stay (HOSPITAL_COMMUNITY)
Admission: AD | Admit: 2022-05-26 | Discharge: 2022-05-26 | Disposition: A | Discharge status: Home / Self Care | Payer: Medicaid Other | Attending: Family Medicine | Admitting: Family Medicine

## 2022-05-26 ENCOUNTER — Inpatient Hospital Stay (HOSPITAL_COMMUNITY): Payer: Medicaid Other

## 2022-05-26 DIAGNOSIS — Z3A01 Less than 8 weeks gestation of pregnancy: Secondary | ICD-10-CM | POA: Insufficient documentation

## 2022-05-26 DIAGNOSIS — O209 Hemorrhage in early pregnancy, unspecified: Secondary | ICD-10-CM | POA: Diagnosis not present

## 2022-05-26 DIAGNOSIS — B3731 Acute candidiasis of vulva and vagina: Secondary | ICD-10-CM

## 2022-05-26 DIAGNOSIS — O219 Vomiting of pregnancy, unspecified: Secondary | ICD-10-CM | POA: Diagnosis present

## 2022-05-26 LAB — CBC
HCT: 35.7 % — ABNORMAL LOW (ref 36.0–46.0)
Hemoglobin: 11.8 g/dL — ABNORMAL LOW (ref 12.0–15.0)
MCH: 27.4 pg (ref 26.0–34.0)
MCHC: 33.1 g/dL (ref 30.0–36.0)
MCV: 82.8 fL (ref 80.0–100.0)
Platelets: 415 10*3/uL — ABNORMAL HIGH (ref 150–400)
RBC: 4.31 MIL/uL (ref 3.87–5.11)
RDW: 15.1 % (ref 11.5–15.5)
WBC: 11.8 10*3/uL — ABNORMAL HIGH (ref 4.0–10.5)
nRBC: 0 % (ref 0.0–0.2)

## 2022-05-26 LAB — URINALYSIS, ROUTINE W REFLEX MICROSCOPIC
Bilirubin Urine: NEGATIVE
Glucose, UA: NEGATIVE mg/dL
Hgb urine dipstick: NEGATIVE
Ketones, ur: 5 mg/dL — AB
Leukocytes,Ua: NEGATIVE
Nitrite: NEGATIVE
Protein, ur: NEGATIVE mg/dL
Specific Gravity, Urine: 1.013 (ref 1.005–1.030)
pH: 7 (ref 5.0–8.0)

## 2022-05-26 LAB — WET PREP, GENITAL
Clue Cells Wet Prep HPF POC: NONE SEEN
Sperm: NONE SEEN
Trich, Wet Prep: NONE SEEN
WBC, Wet Prep HPF POC: <10 (ref <10)

## 2022-05-26 LAB — ABO/RH: ABO/RH(D): B POS

## 2022-05-26 LAB — HCG, QUANTITATIVE, PREGNANCY: hCG, Beta Chain, Quant, S: 102523 m[IU]/mL — ABNORMAL HIGH (ref <5)

## 2022-05-26 MED ORDER — ONDANSETRON HCL 4 MG/2ML IJ SOLN
4.0000 mg | Freq: Once | INTRAMUSCULAR | Status: AC
  Administered 2022-05-26: 4 mg via INTRAVENOUS
  Filled 2022-05-26: qty 2

## 2022-05-26 MED ORDER — METOCLOPRAMIDE HCL 10 MG PO TABS: 10.0000 mg | ORAL_TABLET | Freq: Three times a day (TID) | ORAL | 1 refills | Status: DC | PRN

## 2022-05-26 MED ORDER — FAMOTIDINE IN NACL 20-0.9 MG/50ML-% IV SOLN
20.0000 mg | Freq: Once | INTRAVENOUS | Status: AC
Start: 2022-05-26 — End: 2022-05-26
  Administered 2022-05-26: 20 mg via INTRAVENOUS
  Filled 2022-05-26: qty 50

## 2022-05-26 MED ORDER — PROMETHAZINE HCL 25 MG/ML IJ SOLN
25.0000 mg | Freq: Once | INTRAMUSCULAR | Status: AC
  Administered 2022-05-26: 25 mg via INTRAVENOUS
  Filled 2022-05-26: qty 1

## 2022-05-26 MED ORDER — TERCONAZOLE 0.4 % VA CREA
1.0000 | TOPICAL_CREAM | Freq: Every day | VAGINAL | 0 refills | Status: DC
  Filled 2022-05-26: qty 45, 7d supply, fill #0

## 2022-05-26 MED ORDER — LACTATED RINGERS IV BOLUS
1000.0000 mL | Freq: Once | INTRAVENOUS | Status: AC
  Administered 2022-05-26: 1000 mL via INTRAVENOUS

## 2022-05-26 MED ORDER — DOXYLAMINE-PYRIDOXINE 10-10 MG PO TBEC: 1.0000 | DELAYED_RELEASE_TABLET | Freq: Every day | ORAL | 3 refills | Status: DC

## 2022-05-26 NOTE — MAU Note (Signed)
Courtney Holloway is a 21 y.o. at [redacted]w[redacted]d here in MAU reporting: lower abdominal pain, N/V, and spotting.  Reports unable to keep anything down.  Onset of complaint: 2 weeks ago Pain score: 8 Vitals:   05/26/22 1045  BP: 132/79  Pulse: 93  Resp: 19  Temp: 99 F (37.2 C)  SpO2: 96%     FHT:N/A Lab orders placed from triage:   UA

## 2022-05-26 NOTE — MAU Provider Note (Signed)
Chief Complaint:  Emesis, Nausea, and Spotting   Event Date/Time   First Provider Initiated Contact with Patient 05/26/22 1103     HPI: Courtney PillionMarina Z Balan is a 21 y.o. G1P0 at 7318w1d who presents to maternity admissions reporting continued nausea/vomiting and some brown spotting. She has been struggling with this nausea/vomiting since June 8. This is her third encounter for this problem ([facility] Meds have helped temporarily but she is frustrated that her nausea comes back. Says she has not been able to keep anything down since the 8th. Other than t he brown discharge, she has no other physical complaints.   Pregnancy Course: Has not established formal PNC yet, [facility] which confirmed an IUP at 3142w0d but no heartbeat yet. Repeat U/S in 10-14 days was recommended. Given zofran orally (she threw it up) and [facility]. Did not take any reglan today when nausea came back.  Past Medical History:  Diagnosis Date   Allergy    Anxiety    Phreesia 10/14/2020   Asthma    Asthma    Phreesia 10/14/2020   Depression    Depression    Phreesia 10/14/2020   DMDD (disruptive mood dysregulation disorder) (HCC) 12/28/2015   Failed vision screen 11/21/2014   GAD (generalized anxiety disorder)    Headache(784.0)    Mental disorder    Obesity 11/21/2014   Vision abnormalities    wears glasses   OB History  Gravida Para Term Preterm AB Living  1            SAB IAB Ectopic Multiple Live Births               # Outcome Date GA Lbr Len/2nd Weight Sex Delivery Anes PTL Lv  1 Current            Past Surgical History:  Procedure Laterality Date   NO PAST SURGERIES     Family History  Problem Relation Age of Onset   Diabetes Mother    Bipolar disorder Mother    Schizophrenia Mother    Multiple sclerosis Mother    Diabetes Father    Bipolar disorder Maternal Grandmother    Alcohol abuse Maternal Grandmother    Social  History   Tobacco Use   Smoking status: Former    Types: E-cigarettes    Quit date: 04/13/2022    Years since quitting: 0.1   Smokeless tobacco: Never  Vaping Use   Vaping Use: Former  Substance Use Topics   Alcohol use: Not Currently   Drug use: Not Currently    Types: Marijuana    Comment: 2021   Allergies  Allergen Reactions   Other Anaphylaxis and Itching    Seasonal allergies, dog fur, certain detergents  Pt. Reports mother has used "epi-pen" for past allergies.    Pineapple Anaphylaxis   Fish-Derived Products Diarrhea, Hives and Itching   Zofran [Ondansetron Hcl] Nausea And Vomiting   Medications Prior to Admission  Medication Sig Dispense Refill Last Dose   [DISCONTINUED] metoCLOPramide (REGLAN) 10 MG tablet Take 1 tablet (10 mg total) by mouth every 8 (eight) hours as needed for nausea. 10 tablet 0 05/25/2022   albuterol (VENTOLIN HFA) 108 (90 Base) MCG/ACT inhaler Inhale 2 puffs into the lungs every 4 (four) hours as needed for wheezing or shortness of breath. 18 g 1 More than a month   amoxicillin-clavulanate (AUGMENTIN) 875-125 MG tablet Take  1 tablet by mouth 2 (two) times daily. (Patient not taking: Reported on 05/25/2022) 14 tablet 0    bacitracin-polymyxin b (POLYSPORIN) ophthalmic ointment Place 1 application. into the right eye 2 (two) times daily. apply to eye every 12 hours while awake (Patient not taking: Reported on 05/25/2022) 3.5 g 0    clindamycin (CLEOCIN T) 1 % lotion Apply topically 2 (two) times daily. Use to affected areas in arm pits. (Patient not taking: Reported on 05/25/2022) 60 mL 0    EPINEPHrine (EPIPEN 2-PAK) 0.3 mg/0.3 mL IJ SOAJ injection Inject 0.3 mg into the muscle as needed for anaphylaxis. 1 each 1    fluticasone (FLONASE) 50 MCG/ACT nasal spray Place 2 sprays into both nostrils daily. (Patient not taking: Reported on 05/25/2022) 16 g 6    hydrocortisone ointment 0.5 % Apply 1 application. topically 2 (two) times daily. (Patient not taking:  Reported on 05/25/2022) 30 g 0    Insulin Pen Needle (INSUPEN PEN NEEDLES) 32G X 4 MM MISC BD Pen Needles- use one for victoza 90 each 3    levocetirizine (XYZAL) 5 MG tablet Take 1 tablet (5 mg total) by mouth every evening. 90 tablet 0    lisdexamfetamine (VYVANSE) 40 MG capsule Take 1 capsule (40 mg total) by mouth daily with breakfast. 30 capsule 0    triamcinolone ointment (KENALOG) 0.5 % Apply 1 application topically 2 (two) times daily. (Patient not taking: Reported on 05/25/2022) 60 g 3    valACYclovir (VALTREX) 1000 MG tablet Take 1 tablet (1,000 mg total) by mouth daily. (Patient not taking: Reported on 05/25/2022) 30 tablet 3    VICTOZA 18 MG/3ML SOPN Inject 1.2 mg into the skin daily. (Patient not taking: Reported on 05/25/2022) 6 mL 0     I have reviewed patient's Past Medical Hx, Surgical Hx, Family Hx, Social Hx, medications and allergies.   ROS:  Pertinent items noted in HPI and remainder of comprehensive ROS otherwise negative.   Physical Exam  Patient Vitals for the past 24 hrs:  BP Temp Temp src Pulse Resp SpO2 Height Weight  05/26/22 1103 119/72 -- -- 96 -- -- -- --  05/26/22 1045 132/79 99 F (37.2 C) Oral 93 19 96 % -- --  05/26/22 1040 -- -- -- -- -- --  (1.549 m) 233 lb 14.4 oz (106.1 kg)   Constitutional: Well-developed, well-nourished female in no acute distress.  Cardiovascular: normal rate & rhythm Respiratory: normal effort GI: Abd soft, non-tender MS: Extremities nontender, no edema, normal ROM Neurologic: Alert and oriented x 4.  GU: no CVA tenderness Pelvic: self-swabs collected, pt sent to u/s for repeat   Labs: Results for orders placed or performed during the hospital encounter of 05/26/22 (from the past 24 hour(s))  Urinalysis, Routine w reflex microscopic Urine, Clean Catch     Status: Abnormal   Collection Time: 05/26/22 11:04 AM  Result Value Ref Range   Color, Urine YELLOW YELLOW   APPearance CLEAR CLEAR   Specific Gravity, Urine 1.013  1.005 - 1.030   pH 7.0 5.0 - 8.0   Glucose, UA NEGATIVE NEGATIVE mg/dL   Hgb urine dipstick NEGATIVE NEGATIVE   Bilirubin Urine NEGATIVE NEGATIVE   Ketones, ur 5 (A) NEGATIVE mg/dL   Protein, ur NEGATIVE NEGATIVE mg/dL   Nitrite NEGATIVE NEGATIVE   Leukocytes,Ua NEGATIVE NEGATIVE  Wet prep, genital     Status: Abnormal   Collection Time: 05/26/22 11:28 AM   Specimen: PATH Cytology Cervicovaginal Ancillary Only  Result  Value Ref Range   Yeast Wet Prep HPF POC PRESENT (A) NONE SEEN   Trich, Wet Prep NONE SEEN NONE SEEN   Clue Cells Wet Prep HPF POC NONE SEEN NONE SEEN   WBC, Wet Prep HPF POC <10 <10   Sperm NONE SEEN   CBC     Status: Abnormal   Collection Time: 05/26/22 11:57 AM  Result Value Ref Range   WBC 11.8 (H) 4.0 - 10.5 K/uL   RBC 4.31 3.87 - 5.11 MIL/uL   Hemoglobin 11.8 (L) 12.0 - 15.0 g/dL   HCT 44.0 (L) 34.7 - 42.5 %   MCV 82.8 80.0 - 100.0 fL   MCH 27.4 26.0 - 34.0 pg   MCHC 33.1 30.0 - 36.0 g/dL   RDW 95.6 38.7 - 56.4 %   Platelets 415 (H) 150 - 400 K/uL   nRBC 0.0 0.0 - 0.2 %  ABO/Rh     Status: None   Collection Time: 05/26/22 11:57 AM  Result Value Ref Range   ABO/RH(D) B POS    No rh immune globuloin      NOT A RH IMMUNE GLOBULIN CANDIDATE, PT RH POSITIVE Performed at [facility], [address], [place], [place] [address]   hCG, quantitative, pregnancy     Status: Abnormal   Collection Time: 05/26/22 11:57 AM  Result Value Ref Range   hCG, Beta Chain, Quant, S 102,523 (H) <5 mIU/mL   Imaging:  US OB Transvaginal  Result Date: 05/26/2022 CLINICAL DATA:  Vaginal bleeding. First trimester pregnancy. Beta HCG levels are pending. EXAM: TRANSVAGINAL OB ULTRASOUND TECHNIQUE: Transvaginal ultrasound was performed for complete evaluation of the gestation as well as the maternal uterus, adnexal regions, and pelvic cul-de-sac. COMPARISON:  Obstetric ultrasound 05/18/2022. FINDINGS: Intrauterine gestational sac: Visualized/normal in shape. Yolk sac:   Visualized. Embryo:  Visualized. Cardiac Activity: Visualized. Heart Rate: 138 bpm CRL:  10.3 mm; 7 w 1 d;                  [place] EDC: 01/11/2023 Subchorionic hemorrhage: None.  Unfused amnion. Maternal uterus/adnexae: Both maternal ovaries are visualized. Trace free pelvic fluid. No adnexal mass. IMPRESSION: 1. Single live intrauterine pregnancy with best estimated gestational age of [redacted] weeks 1 day. 2. No adnexal mass or acute findings identified. Electronically Signed   By: Carey Bullocks M.D.   On: 05/26/2022 12:40    MAU Course: Orders Placed This Encounter  Procedures   Wet prep, genital   US OB Transvaginal   Urinalysis, Routine w reflex microscopic Urine, Clean Catch   CBC   hCG, quantitative, pregnancy   ABO/Rh   Discharge patient   Meds ordered this encounter  Medications   lactated ringers bolus 1,000 mL   ondansetron (ZOFRAN) injection 4 mg   famotidine (PEPCID) IVPB 20 mg premix   promethazine (PHENERGAN) 25 mg in sodium chloride 0.9 % 1,000 mL infusion   metoCLOPramide (REGLAN) 10 MG tablet    Sig: Take 1 tablet (10 mg total) by mouth every 8 (eight) hours as needed for nausea.    Dispense:  30 tablet    Refill:  1    Order Specific Question:   Supervising Provider    Answer:   Reva Bores [[id number]]   Doxylamine-Pyridoxine (DICLEGIS) 10-10 MG TBEC    Sig: Take 1-2 tablets by mouth at bedtime.    Dispense:  60 tablet    Refill:  3    Order Specific Question:   Supervising  Provider    Answer:   Samara Snide   MDM: LR bolus + IV zofran (no vomiting afterward) and pepcid IVPB given which mostly relieved nausea. Phenergan in NS given which helped nausea further. Pt declined PO challenge, wanting more than crackers but was stable to try food at home. Encouraged pt to take diclegis at night with reglan for breakthrough nausea.   Repeated U/S to confirm viability - embryo and fetal heart rate now present. Pt feeling ambivalent about pregnancy but planning to continue it.  Brown discharge likely from yeast infection causing cervical/vaginal tissue friability. Terconazole prescribed.  Assessment: 1. Nausea/vomiting in pregnancy   2. [redacted] weeks gestation of pregnancy    Plan: Discharge home in stable condition with return precautions.     Follow-up Information     [facility] for [facility] Follow up.   Specialty: Obstetrics and Gynecology Why: as scheduled for ongoing prenatal care Contact information: [address] [place] [address] [phone]                Allergies as of 05/26/2022       Reactions   Other Anaphylaxis, Itching   Seasonal allergies, dog fur, certain detergents  Pt. Reports mother has used "epi-pen" for past allergies.    Pineapple Anaphylaxis   Fish-derived Products Diarrhea, Hives, Itching   Zofran [ondansetron Hcl] Nausea And Vomiting        Medication List     STOP taking these medications    amoxicillin-clavulanate 875-125 MG tablet Commonly known as: AUGMENTIN   bacitracin-polymyxin b ophthalmic ointment Commonly known as: POLYSPORIN   clindamycin 1 % lotion Commonly known as: CLEOCIN T   fluticasone 50 MCG/ACT nasal spray Commonly known as: FLONASE   hydrocortisone ointment 0.5 %   levocetirizine 5 MG tablet Commonly known as: XYZAL   triamcinolone ointment 0.5 % Commonly known as: KENALOG   valACYclovir 1000 MG tablet Commonly known as: VALTREX   Victoza 18 MG/3ML Sopn Generic drug: liraglutide       TAKE these medications    albuterol 108 (90 Base) MCG/ACT inhaler Commonly known as: VENTOLIN HFA Inhale 2 puffs into the lungs every 4 (four) hours as needed for wheezing or shortness of breath.   Doxylamine-Pyridoxine 10-10 MG Tbec Commonly known as: Diclegis Take 1-2 tablets by mouth at bedtime.   EPINEPHrine 0.3 mg/0.3 mL Soaj injection Commonly known as: EpiPen 2-Pak Inject 0.3 mg into the muscle as needed for  anaphylaxis.   Insupen Pen Needles 32G X 4 MM Misc Generic drug: Insulin Pen Needle BD Pen Needles- use one for victoza   metoCLOPramide 10 MG tablet Commonly known as: REGLAN Take 1 tablet (10 mg total) by mouth every 8 (eight) hours as needed for nausea.   Vyvanse 40 MG capsule Generic drug: lisdexamfetamine Take 1 capsule (40 mg total) by mouth daily with breakfast.       Edd Arbour, CNM, MSN, IBCLC Certified Nurse Midwife, [facility]

## 2022-05-27 ENCOUNTER — Other Ambulatory Visit (HOSPITAL_COMMUNITY): Payer: Self-pay

## 2022-05-27 LAB — GC/CHLAMYDIA PROBE AMP (~~LOC~~) NOT AT ARMC
Chlamydia: NEGATIVE
Comment: NEGATIVE
Comment: NORMAL
Neisseria Gonorrhea: NEGATIVE

## 2022-06-07 ENCOUNTER — Other Ambulatory Visit (HOSPITAL_COMMUNITY): Payer: Self-pay

## 2022-06-14 IMAGING — CR DG ANKLE COMPLETE 3+V*L*
3 series · 3 of 3 positions shown · non-contrast
Comparison: None.

CLINICAL DATA: Left ankle pain

EXAM:
LEFT ANKLE COMPLETE - 3+ VIEW

[ankle ap]
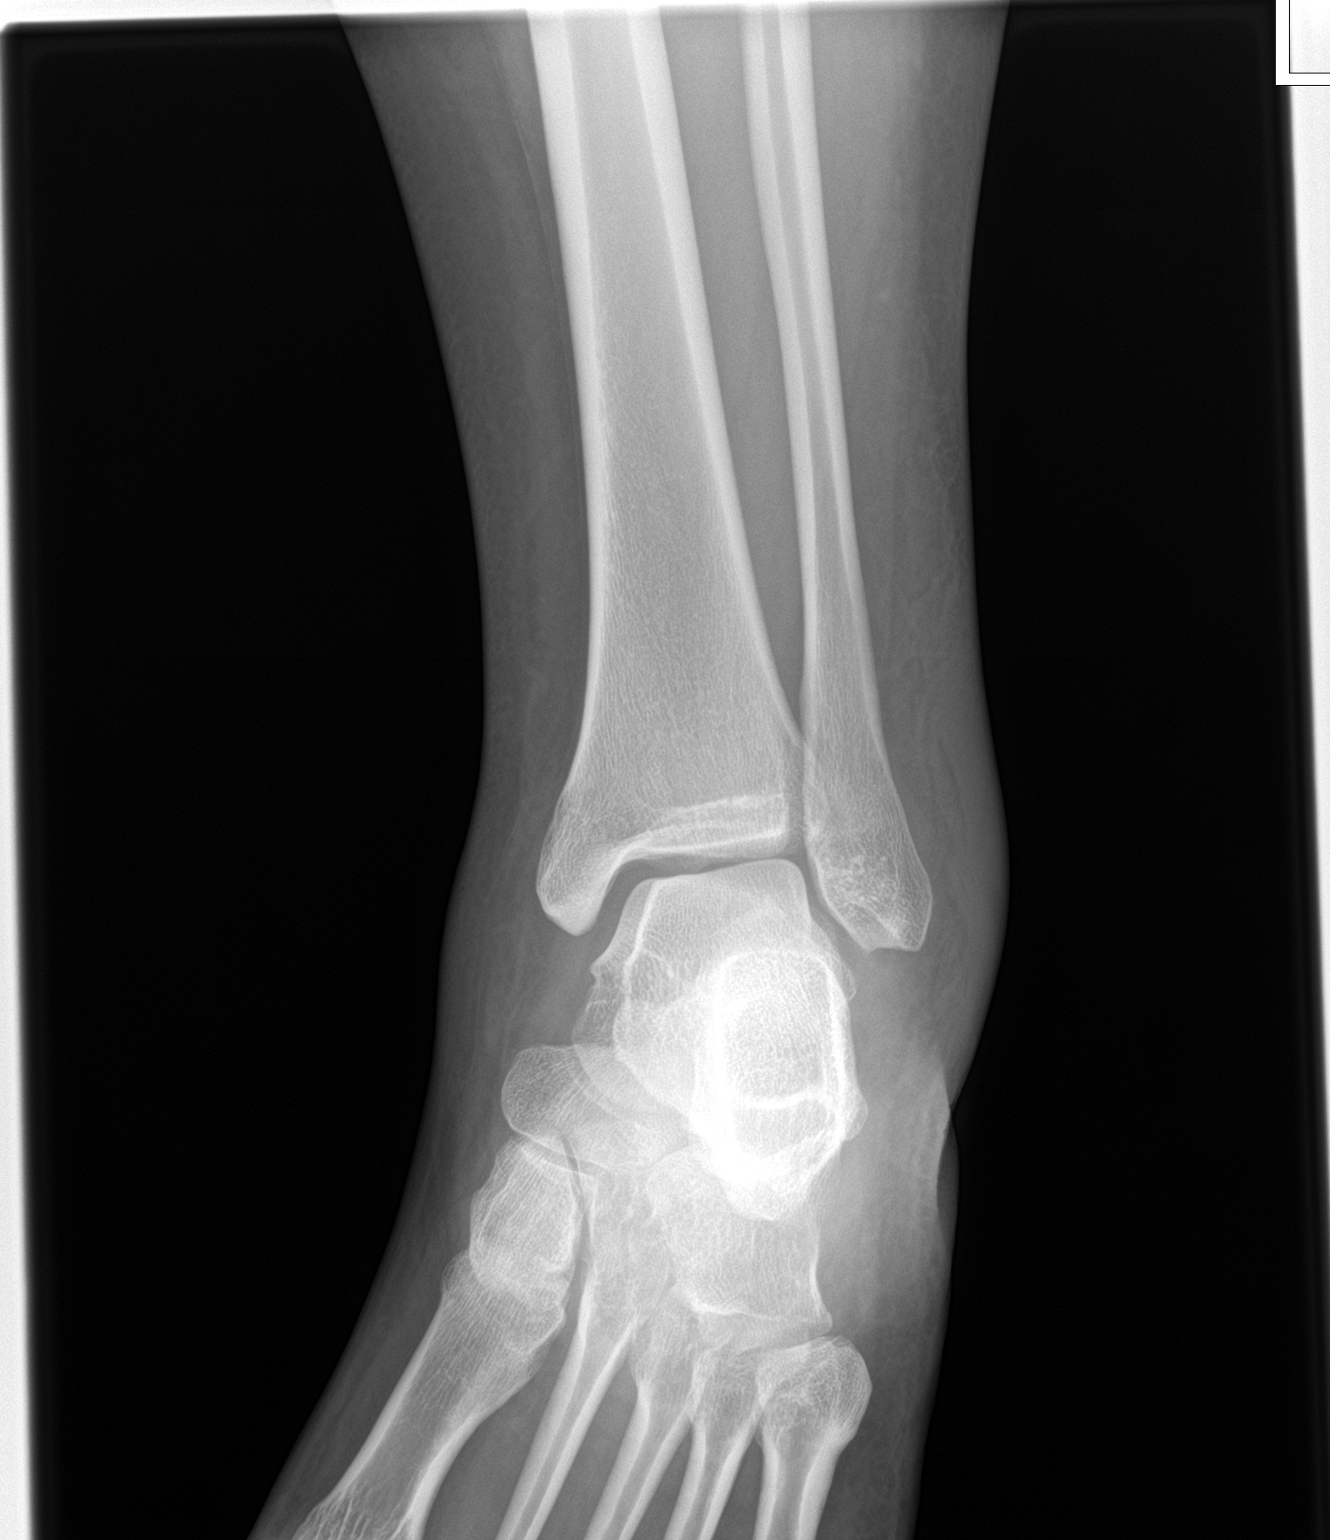

[ankle obl]
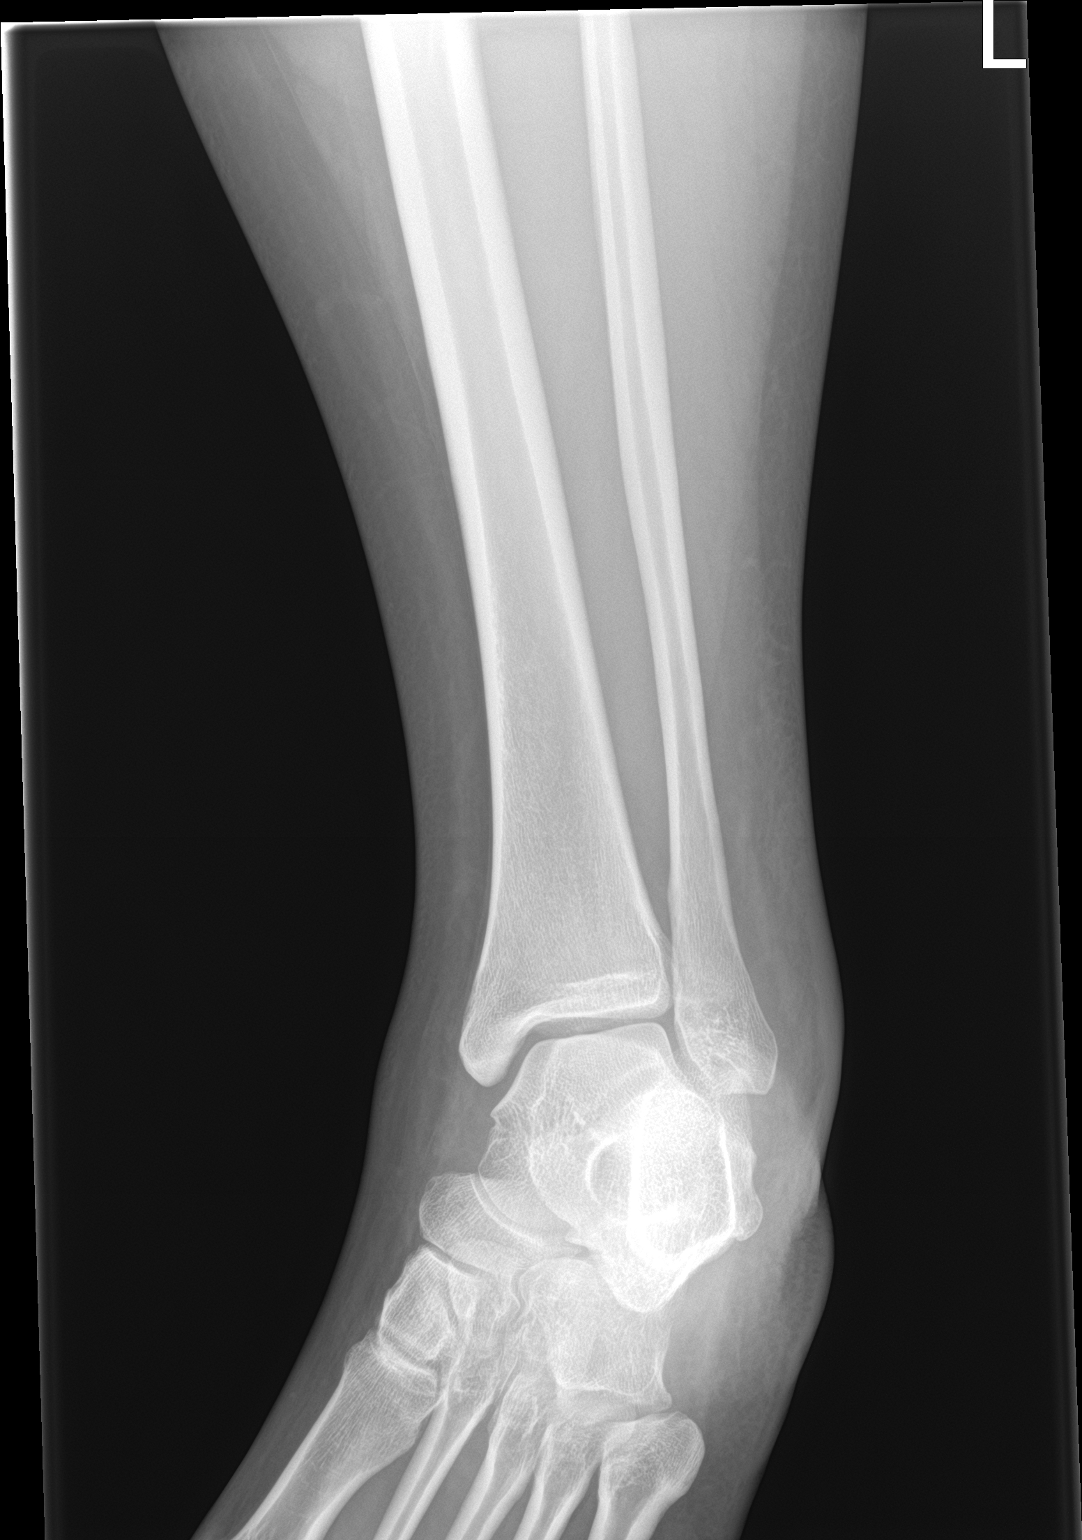

[ankle lat]
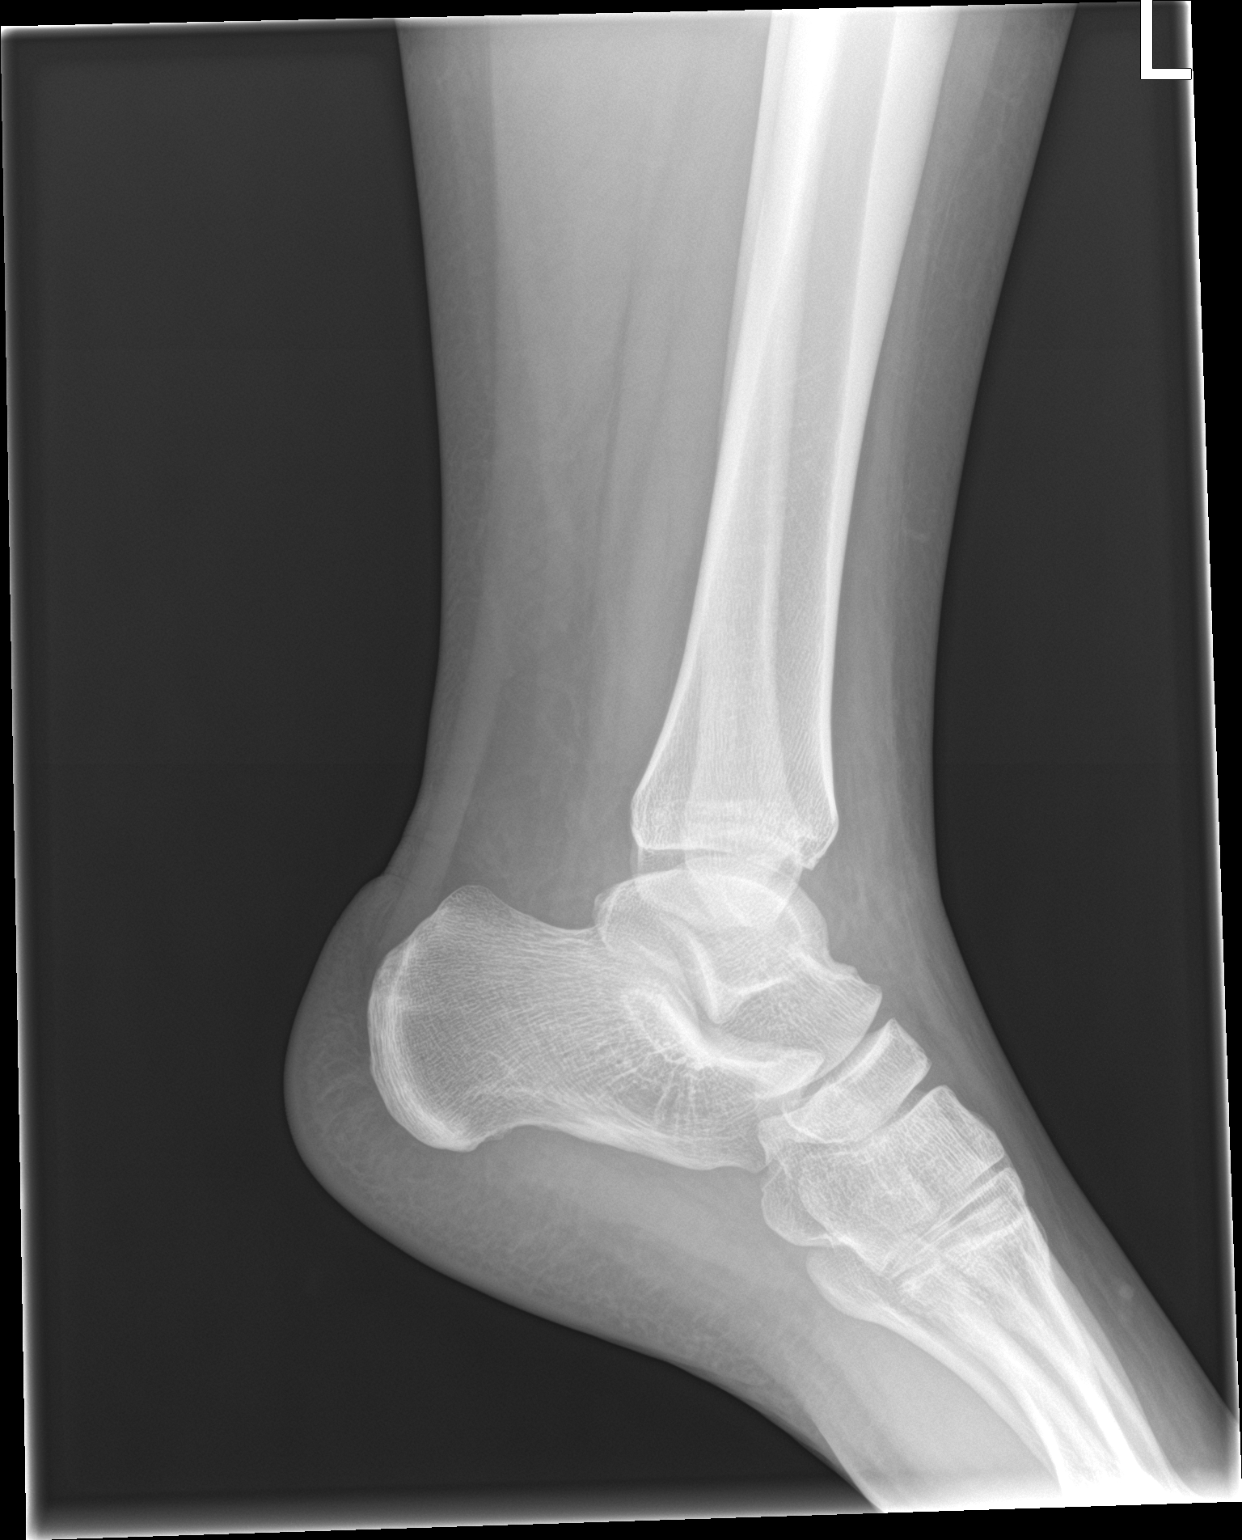

[3 of 3 positions shown; findings below may reference images not displayed]

FINDINGS: There is marked lateral soft tissue swelling. No underlying fracture
or dislocation. No radiopaque foreign bodies or significant
arthropathy.
IMPRESSION: Lateral soft tissue swelling.  No underlying fracture

## 2022-06-17 ENCOUNTER — Other Ambulatory Visit: Payer: Self-pay

## 2022-06-17 ENCOUNTER — Inpatient Hospital Stay (HOSPITAL_COMMUNITY)
Admission: AD | Admit: 2022-06-17 | Discharge: 2022-06-17 | Disposition: A | Discharge status: Home / Self Care | Payer: Medicaid Other | Attending: Obstetrics & Gynecology | Admitting: Obstetrics & Gynecology

## 2022-06-17 DIAGNOSIS — O99891 Other specified diseases and conditions complicating pregnancy: Secondary | ICD-10-CM | POA: Diagnosis not present

## 2022-06-17 DIAGNOSIS — O99211 Obesity complicating pregnancy, first trimester: Secondary | ICD-10-CM | POA: Diagnosis not present

## 2022-06-17 DIAGNOSIS — Z3A10 10 weeks gestation of pregnancy: Secondary | ICD-10-CM | POA: Diagnosis not present

## 2022-06-17 DIAGNOSIS — O26891 Other specified pregnancy related conditions, first trimester: Secondary | ICD-10-CM | POA: Insufficient documentation

## 2022-06-17 DIAGNOSIS — M7918 Myalgia, other site: Secondary | ICD-10-CM

## 2022-06-17 DIAGNOSIS — F411 Generalized anxiety disorder: Secondary | ICD-10-CM | POA: Insufficient documentation

## 2022-06-17 DIAGNOSIS — O99341 Other mental disorders complicating pregnancy, first trimester: Secondary | ICD-10-CM | POA: Insufficient documentation

## 2022-06-17 DIAGNOSIS — F17290 Nicotine dependence, other tobacco product, uncomplicated: Secondary | ICD-10-CM | POA: Diagnosis not present

## 2022-06-17 DIAGNOSIS — O99331 Smoking (tobacco) complicating pregnancy, first trimester: Secondary | ICD-10-CM | POA: Insufficient documentation

## 2022-06-17 DIAGNOSIS — F3481 Disruptive mood dysregulation disorder: Secondary | ICD-10-CM | POA: Diagnosis not present

## 2022-06-17 DIAGNOSIS — O99511 Diseases of the respiratory system complicating pregnancy, first trimester: Secondary | ICD-10-CM | POA: Diagnosis not present

## 2022-06-17 DIAGNOSIS — F1290 Cannabis use, unspecified, uncomplicated: Secondary | ICD-10-CM | POA: Diagnosis not present

## 2022-06-17 DIAGNOSIS — O209 Hemorrhage in early pregnancy, unspecified: Secondary | ICD-10-CM | POA: Diagnosis not present

## 2022-06-17 DIAGNOSIS — O99321 Drug use complicating pregnancy, first trimester: Secondary | ICD-10-CM | POA: Insufficient documentation

## 2022-06-17 DIAGNOSIS — R1030 Lower abdominal pain, unspecified: Secondary | ICD-10-CM | POA: Diagnosis present

## 2022-06-17 DIAGNOSIS — Z79899 Other long term (current) drug therapy: Secondary | ICD-10-CM | POA: Insufficient documentation

## 2022-06-17 LAB — URINALYSIS, ROUTINE W REFLEX MICROSCOPIC
Bilirubin Urine: NEGATIVE
Glucose, UA: NEGATIVE mg/dL
Hgb urine dipstick: NEGATIVE
Ketones, ur: NEGATIVE mg/dL
Nitrite: NEGATIVE
Protein, ur: NEGATIVE mg/dL
Specific Gravity, Urine: 1.029 (ref 1.005–1.030)
pH: 6 (ref 5.0–8.0)

## 2022-06-17 MED ORDER — COMFORT FIT MATERNITY SUPP LG MISC: 1.0000 | Freq: Every day | 0 refills | Status: DC

## 2022-06-17 NOTE — Progress Notes (Signed)
Written and verbal d/c instructions given and understanding voiced. 

## 2022-06-17 NOTE — MAU Note (Addendum)
.  Courtney Holloway is a 21 y.o. at [redacted]w[redacted]d here in MAU reporting light vag bleeding yesterday. Today had constant pain in abdomen while at work. Went to [facility] about 2hrs ago and saw vag bleeding. 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 Pain is worse when I walk.  Onset of complaint: yesterday Pain score: 7 Vitals:   06/17/22 1957 06/17/22 1959  BP:  119/80  Pulse: 91   Resp: 18   Temp: 98.5 F (36.9 C)   SpO2: 99%      FHT:n/a Lab orders placed from triage:   U/a

## 2022-06-17 NOTE — MAU Provider Note (Signed)
History     CSN: [id number]  Arrival date and time: 06/17/22 1950   Event Date/Time   First Provider Initiated Contact with Patient 06/17/22 2008      Chief Complaint  Patient presents with   Abdominal Pain   Vaginal Bleeding   21 y.o. G` @10 .2 wks with known IUP presenting with LAP and spotting. Reports onset of spotting yesterday and again today. Has not needed a pad. She also reports constant lower abdominal cramping today while working. Rates pain 2/10. Has not treated it. 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 States they are understaffed with no one to help her. She is afraid she will hurt her baby.    OB History     Gravida  1   Para      Term      Preterm      AB      Living         SAB      IAB      Ectopic      Multiple      Live Births              Past Medical History:  Diagnosis Date   Allergy    Anxiety    [employer]reesia 10/14/2020   Asthma    Asthma    [employer]reesia 10/14/2020   Depression    Depression    [employer]ia 10/14/2020   DMDD (disruptive mood dysregulation disorder) (HCC) 12/28/2015   Failed vision screen 11/21/2014   GAD (generalized anxiety disorder)    Headache(784.0)    Mental disorder    Obesity 11/21/2014   Vision abnormalities    wears glasses    Past Surgical History:  Procedure Laterality Date   NO PAST SURGERIES      Family History  Problem Relation Age of Onset   Diabetes Mother    Bipolar disorder Mother    Schizophrenia Mother    Multiple sclerosis Mother    Diabetes Father    Bipolar disorder Maternal Grandmother    Alcohol abuse Maternal Grandmother     Social History   Tobacco Use   Smoking status: Former    Types: E-cigarettes    Quit date: 04/13/2022    Years since quitting: 0.1   Smokeless tobacco: Never  Vaping Use   Vaping Use: Former  Substance Use Topics   Alcohol use: Not Currently   Drug use: Not Currently    Types: Marijuana    Comment:  2021    Allergies:  Allergies  Allergen Reactions   Other Anaphylaxis and Itching    Seasonal allergies, dog fur, certain detergents  Pt. Reports mother has used "epi-pen" for past allergies.    Pineapple Anaphylaxis   Fish-Derived Products Diarrhea, Hives and Itching   Zofran [Ondansetron Hcl] Nausea And Vomiting    Medications Prior to Admission  Medication Sig Dispense Refill Last Dose   albuterol (VENTOLIN HFA) 108 (90 Base) MCG/ACT inhaler Inhale 2 puffs into the lungs every 4 (four) hours as needed for wheezing or shortness of breath. 18 g 1    Doxylamine-Pyridoxine (DICLEGIS) 10-10 MG TBEC Take 1-2 tablets by mouth at bedtime. 60 tablet 3    EPINEPHrine (EPIPEN 2-PAK) 0.3 mg/0.3 mL IJ SOAJ injection Inject 0.3 mg into the muscle as needed for anaphylaxis. 1 each 1    Insulin Pen Needle (INSUPEN PEN NEEDLES) 32G X 4 MM MISC BD Pen Needles- use  one for victoza 90 each 3    lisdexamfetamine (VYVANSE) 40 MG capsule Take 1 capsule (40 mg total) by mouth daily with breakfast. 30 capsule 0    metoCLOPramide (REGLAN) 10 MG tablet Take 1 tablet (10 mg total) by mouth every 8 (eight) hours as needed for nausea. 30 tablet 1    terconazole (TERAZOL 7) 0.4 % vaginal cream Insert 1 applicator vaginally at bedtime. 45 g 0     Review of Systems  Gastrointestinal:  Positive for abdominal pain.  Genitourinary:  Positive for vaginal bleeding.   Physical Exam   Blood pressure 119/80, pulse 91, temperature 98.5 F (36.9 C), resp. rate 18, height 5\' 1"  (1.549 m), weight 104.8 kg, last menstrual period 04/01/2022, SpO2 99 %.  Physical Exam Vitals and nursing note reviewed.  Constitutional:      General: She is not in acute distress.    Appearance: Normal appearance.  HENT:     Head: Normocephalic and atraumatic.  Cardiovascular:     Rate and Rhythm: Normal rate.  Pulmonary:     Effort: Pulmonary effort is normal. No respiratory distress.  Abdominal:     General: There is no distension.      Palpations: Abdomen is soft. There is no mass.     Tenderness: There is no abdominal tenderness. There is no guarding or rebound.     Hernia: No hernia is present.  Musculoskeletal:        General: Normal range of motion.     Cervical back: Normal range of motion.  Skin:    General: Skin is warm and dry.  Neurological:     General: No focal deficit present.     Mental Status: She is alert and oriented to person, place, and time.  Psychiatric:        Mood and Affect: Mood normal.        Behavior: Behavior normal.   Limited bedside US: viable, active fetus, FHR 176 bpm, subj. nml AFV, no [facility]  Results for orders placed or performed during the hospital encounter of 06/17/22 (from the past 24 hour(s))  Urinalysis, Routine w reflex microscopic Urine, Clean Catch     Status: Abnormal   Collection Time: 06/17/22  8:05 PM  Result Value Ref Range   Color, Urine YELLOW YELLOW   APPearance CLOUDY (A) CLEAR   Specific Gravity, Urine 1.029 1.005 - 1.030   pH 6.0 5.0 - 8.0   Glucose, UA NEGATIVE NEGATIVE mg/dL   Hgb urine dipstick NEGATIVE NEGATIVE   Bilirubin Urine NEGATIVE NEGATIVE   Ketones, ur NEGATIVE NEGATIVE mg/dL   Protein, ur NEGATIVE NEGATIVE mg/dL   Nitrite NEGATIVE NEGATIVE   Leukocytes,Ua LARGE (A) NEGATIVE   WBC, UA 11-20 0 - 5 WBC/hpf   Bacteria, UA FEW (A) NONE SEEN   Squamous Epithelial / LPF 0-5 0 - 5   Mucus PRESENT    Ca Oxalate Crys, UA PRESENT    MAU Course  Procedures  MDM Pt reassured lifting unlikely to cause SAB. Recommend maternity support belt, Tylenol, and heating pad. Limit lifting to 20 lbs, note provided. She may need to seek employment elsewhere if her employer can't accommodate. She is stable for discharge home.   Assessment and Plan   1. [redacted] weeks gestation of pregnancy   2. Musculoskeletal pain    Discharge home Follow up at [facility] as scheduled tomorrow SAB precautions Rx maternity belt  Allergies as of 06/17/2022       Reactions   Other  Anaphylaxis, Itching   Seasonal allergies, dog fur, certain detergents  Pt. Reports mother has used "epi-pen" for past allergies.    Pineapple Anaphylaxis   Fish-derived Products Diarrhea, Hives, Itching   Zofran [ondansetron Hcl] Nausea And Vomiting        Medication List     STOP taking these medications    Insupen Pen Needles 32G X 4 MM Misc Generic drug: Insulin Pen Needle   terconazole 0.4 % vaginal cream Commonly known as: TERAZOL 7   Vyvanse 40 MG capsule Generic drug: lisdexamfetamine       TAKE these medications    albuterol 108 (90 Base) MCG/ACT inhaler Commonly known as: VENTOLIN HFA Inhale 2 puffs into the lungs every 4 (four) hours as needed for wheezing or shortness of breath.   Comfort Fit Maternity Supp Lg Misc 1 Application by Does not apply route daily.   Doxylamine-Pyridoxine 10-10 MG Tbec Commonly known as: Diclegis Take 1-2 tablets by mouth at bedtime.   EPINEPHrine 0.3 mg/0.3 mL Soaj injection Commonly known as: EpiPen 2-Pak Inject 0.3 mg into the muscle as needed for anaphylaxis.   metoCLOPramide 10 MG tablet Commonly known as: REGLAN Take 1 tablet (10 mg total) by mouth every 8 (eight) hours as needed for nausea.        Donette Larry, CNM 06/17/2022, 8:35 PM

## 2022-06-17 NOTE — Discharge Instructions (Signed)
   PREGNANCY SUPPORT BELT: You are not alone, Seventy-five percent of women have some sort of abdominal or back pain at some point in their pregnancy. Your baby is growing at a fast pace, which means that your whole body is rapidly trying to adjust to the changes. As your uterus grows, your back may start feeling a bit under stress and this can result in back or abdominal pain that can go from mild, and therefore bearable, to severe pains that will not allow you to sit or lay down comfortably, When it comes to dealing with pregnancy-related pains and cramps, some pregnant women usually prefer natural remedies, which the market is filled with nowadays. For example, wearing a pregnancy support belt can help ease and lessen your discomfort and pain. WHAT ARE THE BENEFITS OF WEARING A PREGNANCY SUPPORT BELT? A pregnancy support belt provides support to the lower portion of the belly taking some of the weight of the growing uterus and distributing to the other parts of your body. It is designed make you comfortable and gives you extra support. Over the years, the pregnancy apparel market has been studying the needs and wants of pregnant women and they have come up with the most comfortable pregnancy support belts that woman could ever ask for. In fact, you will no longer have to wear a stretched-out or bulky pregnancy belt that is visible underneath your clothes and makes you feel even more uncomfortable. Nowadays, a pregnancy support belt is made of comfortable and stretchy materials that will not irritate your skin but will actually make you feel at ease and you will not even notice you are wearing it. They are easy to put on and adjust during the day and can be worn at night for additional support.  BENEFITS: Relives Back pain Relieves Abdominal Muscle and Leg Pain Stabilizes the Pelvic Ring Offers a Cushioned Abdominal Lift Pad Relieves pressure on the Sciatic Nerve Within Minutes   Locations that Carry  Maternity/Pregnancy Support Belts  These locations will file your insurance, including [place] [employer]:  [facility] [address] [place], [place] [address] [phone]  [facility] [address]  [place], [place] [address] [phone]  [facility] [address] [place], [place] [address] [phone]  [facility] [address] [place], [place] [address] [phone]  If you have any problems getting the belt, let your [facility] office know.  

## 2022-06-17 NOTE — MAU Note (Signed)
Donette Larry CNM in Triage to see pt and do bedside u/s

## 2022-06-18 ENCOUNTER — Encounter: Payer: Self-pay | Admitting: Family

## 2022-06-18 ENCOUNTER — Telehealth (INDEPENDENT_AMBULATORY_CARE_PROVIDER_SITE_OTHER): Payer: Medicaid Other

## 2022-06-18 DIAGNOSIS — O0990 Supervision of high risk pregnancy, unspecified, unspecified trimester: Secondary | ICD-10-CM | POA: Insufficient documentation

## 2022-06-18 DIAGNOSIS — Z3480 Encounter for supervision of other normal pregnancy, unspecified trimester: Secondary | ICD-10-CM

## 2022-06-18 DIAGNOSIS — R110 Nausea: Secondary | ICD-10-CM

## 2022-06-18 DIAGNOSIS — Z3A00 Weeks of gestation of pregnancy not specified: Secondary | ICD-10-CM

## 2022-06-18 MED ORDER — METOCLOPRAMIDE HCL 10 MG PO TABS: 10.0000 mg | ORAL_TABLET | Freq: Three times a day (TID) | ORAL | 1 refills | Status: DC | PRN

## 2022-06-18 NOTE — Patient Instructions (Signed)
AREA PEDIATRIC/FAMILY PRACTICE PHYSICIANS  Central/[place]outheast [place] ([address]) [facility] Chambliss, MD; Eniola, MD; Hale, MD; Hensel, MD; McDiarmid, MD; McIntyer, MD; Floria Brandau, MD; Walden, MD [address] [place], [place] [address] [phone] Mon-Fri 8:30-12:30, 1:30-5:00 Providers come to see babies at [facility] Accepting Medicaid [facility] providers who accept newborns: Koirala, MD; Morrow, MD; Wolters, MD [address], [place], [place] [address] [phone] Mon-Fri 8:00-5:30 Babies seen by providers at [facility] Does NOT accept Medicaid Please call early in hospitalization for appointment (limited availability)  [facility] Mulberry, MD [address] [place], [place] [address] [phone] Mon, Tue, Thur, Fri 8:30-5:00, Wed 10:00-7:00 (closed 1-2pm) Babies seen by [facility] providers Accepting Medicaid Rubin - Pediatrician Rubin, MD [address], [place], [place] [address] [phone] Mon-Fri 8:30-5:00, Sat 8:30-12:00 Provider comes to see babies at [facility] Accepting Medicaid Must have been referred from current patients or contacted office prior to delivery [facility] ([facility]) Brown, MD; Chandler, MD; Ettefagh, MD; Grant, MD; Lester, MD; McCormick, MD; McQueen, MD; Prose, MD; Simha, MD; Stanley, MD; Stryffeler, NP; Tebben, NP [address], [place], [place] [address] [phone] Mon, Tue, Thur, Fri 8:30-5:30, Wed 9:30-5:30, Sat 8:30-12:30 Babies seen by [facility] providers Accepting Medicaid Only accepting infants of first-time parents or siblings of current patients Hospital discharge coordinator will make follow-up appointment Jack Amos [address], [place], [place]  [address] [phone]   Fax - [phone] [facility] [address], [place], [place]  [address] Phone - [phone]   Fax - [phone] Shilpa Gosrani [address], [place], [place]  [address] [phone]  [place]heast [place] ([address]) [facility] Bates, MD; Brassfield, MD; Cooper, Cox, MD; MD; Davis, MD; Dovico, MD; Ettefaugh, MD; Little, MD; Lowe, MD; Keiffer, MD; Melvin, MD; Sumner, MD; Williams, MD [address], [place], [place] [address] [phone] Mon-Fri 8:30-5:00 (extended evenings Mon-Thur as needed), Sat-Sun 10:00-1:00 Providers come to see babies at [facility] Accepting Medicaid for families of first-time babies and families with all children in the household age 3 and under. Must register with office prior to making appointment (M-F only). [facility] Henson, NP; Knapp, MD; Lalonde, MD; Tysinger, PA [address] [place], [place] [address] [phone] Mon-Fri 8:00-5:00 Babies seen by providers at [facility] Does NOT accept Medicaid/Commercial Insurance Only [facility] ([facility])  Artis, MD; Barnes, MD; Bratton, MD; Coccaro, MD; Lockett Gardner, MD; Kramer, MD; Marshall, MD; Netherton, MD; Poleto, MD; Skinner, MD [address] [place],  [address] [phone] Mon-Fri 8:30-5:30, Sat (Oct.-Mar.) 9:00-1:00 Babies seen by providers at [facility] Accepting Medicaid  [place] ([address]) [facility] Reid, MD; Warner, MD [address], ,  [address] [phone] Mon-Fri 8:30-5:00, Sat 8:30-12:00 Providers come to see babies at [facility] Does NOT accept Medicaid [facility] Becker, PA; Hagler, MD; Scifres, PA; Sun, MD; Swayne, MD [address], ,  [address] [phone] Mon-Fri 8:00-5:00 Babies seen by providers at [facility] Does NOT accept Medicaid Only accepting babies of parents who  are patients Please call early in hospitalization for appointment (limited availability)  Pediatricians Clark, MD; Frye, MD; Kelleher, MD; Mack, NP; Miller, MD; O'Keller, MD; Patterson, NP; Pudlo, MD; Puzio, MD; Thomas, MD; Tucker, MD; Twiselton, MD [address], [place], [place] [address] [phone] Mon-Fri 8:00-5:00, Sat 9:00-12:00 Providers come to see babies at [facility] Does NOT accept Medicaid  [place] ([address]) [facility] providers accepting new patients: Brake, NP; Wharton, PA [address], [place], [place] [address] [phone] Mon-Fri 8:00-5:00 Babies seen by providers at [facility] Does NOT accept Medicaid Only accepting babies of parents who are patients Please call early in hospitalization for appointment (limited availability) [facility] Gay, MD; Quinlan, MD [address] [place], [place] [address] [phone] (press 1 to schedule appointment) Mon-Fri 8:00-5:00 Providers come to see babies at [facility] Does NOT accept Medicaid [facility] Mazer, MD [address] [place], [place] [address] [phone] Mon-Fri 8:30-5:00 (lunch 12:30-1:00), extended hours by appointment only Wed 5:00-6:30 Babies seen by [facility] providers Accepting Medicaid [facility] Banks, MD; Jordan, MD; Koberlein, MD [address], [place], [place] [address] [phone] Mon-Fri 8:00-5:00 Babies seen by [facility] providers Does NOT accept Medicaid [facility] Parker, MD; Hunter, MD; Wallace, DO [address] [place], [place] [address] [phone] Mon-Fri 8:00-5:00 Babies seen by [facility] providers Does NOT accept Medicaid [facility] Brandon, PA; Brecken, PA; Christy, NP; Dees, MD; DeClaire, MD; DeWeese, MD; Hansen, NP; Mills, NP; Parrish, NP; Smoot, NP; Summer, MD; Vapne,  MD [address] [place], [place] [address] [phone] Mon-Fri 8:30-5:00, Sat 10:00-1:00 Providers come to see babies at [facility] Does NOT accept Medicaid Free prenatal information session Tuesdays at 4:45pm [facility] Bouska, MD; Gordon, PA; Jeffery, PA; Weber, PA [address] [place] [address] [phone] Mon-Fri 7:30-5:30 Babies seen by [facility] providers [facility] [address], [place], [place]  [address] [phone]   Fax - [phone]  [place] ([address] & [address]) [facility] [place] [address] [place], [place] [address] [phone] Mon-Thur 8:00-6:00 Providers come to see babies at [facility] Accepting Medicaid [facility] Anderson, NP; Badger, MD; Beal, PA; Spencer, PA [address] [place], [place] [address] [phone] Mon-Thur 7:30-7:30, Fri 7:30-4:30 Babies seen by [facility] providers Accepting Medicaid [facility] Agbuya, MD; Klett, NP; Romgoolam, MD [address], [place], [place] [address] [phone] Mon-Fri 8:30-5:00, Sat 8:30-12:00 Providers come to see babies at [facility] Accepting Medicaid Must have "Meet & Greet" appointment at office prior to delivery [facility] McCord, MD; Wallace, MD; Wood, MD [address], [place], [place] [address] [phone] Mon-Wed 8:00-6:00, Thur-Fri 8:00-5:00, Sat 9:00-12:00 Providers come to see babies at [facility] Does NOT accept Medicaid Only accepting siblings of current patients [facility]  [address], [place], [place]  [address] [phone]   Fax - [phone] [facility] [address], [place], [place]  [address] [phone]   Fax -  [phone]  [place]  ([address] & [address]) [facility] Cirigliano, DO; Matthews, DO [address] , [place] [address] [phone] Mon-Fri 7:00-5:00 Babies seen by [facility] providers Does NOT accept Medicaid [facility] Briscoe, MD; Howley, PA; Moreira, PA [address], [place],  [address] [phone] Mon-Fri 8:00-5:00 Babies seen by [facility] providers Accepting Medicaid [facility] Boyd, MD; Church, PA; Jones, NP; [place], PA [address], ,  [address] [phone] Mon-Fri 8:00-5:00 Babies seen by providers at [facility] Accepting Medicaid  [place] ([address]) [facility] [place] Wendling, DO [address] [place], [place] [address] [phone] Mon-Fri 8:00-5:00 Babies seen by [facility] providers Does NOT accept Medicaid Limited availability, please call early in hospitalization to schedule follow-up [facility] Calderon, PA; Cummings, MD; Dillard, MD; Martin, PA; Olson, MD; VanDeven, PA [address], [place], [place] [address] [phone] Mon-Fri 8:30-5:00, Sat 9:00-12:00 Babies seen by providers at [facility] Accepting Medicaid Please register online then schedule online or call office [email or web address] Hunter, NP; Kumar, MD; Martin Rogers, PA [address], [place], [place] [address] [phone] Mon-Fri 8:00-5:00 Babies seen by providers at [facility] Accepting Medicaid [facility]) [place], MD; Kristi Fleenor, NP; West, MD [address], [place], [place] [address] [phone] Mon-Fri 8:00-5:30, Sat&Sun by appointment (phones open at  8:30) Babies seen by [facility] providers Accepting Medicaid Must be a first-time baby or sibling of current patient [facility] [place]  [address], [place], [place]  [address] [phone]   Fax - [phone]  [place] ([address]) [facility] Brown, PA; Cowen, PA; Rice, MD; Helton, PA; Spry, MD [address] [place], [place] [address] [phone] Mon-Thur 8:00-7:00, Fri 8:00-5:00, Sat 8:00-12:00, Sun 9:00-12:00 Babies seen by [facility] providers Accepting Medicaid [facility] Coe-Goins, MD; Marshall, MD; Pierre-Louis, MD [address], [place], [place] [address] [phone] Mon-Thur 8:00-5:00 Babies seen by providers at [facility] Accepting Medicaid [facility] Bratton, MD; Coe-Goins, MD; Hayes, MD; Lewis, MD; List, MD; Lott, MD; Marshall, MD; Moran, MD; O'Judia Arnott, MD; Pierre-Louis, MD; Pitonzo, MD; Scholer, MD; Spangle, MD [address] [place], [place] [address] [phone] Mon-Fri 8:00-5:30, Sat (Oct.-Mar.) 9:00-1:00 Babies seen by providers at [facility] Accepting Medicaid Must fill out new patient packet, available online at [email or web address] [facility] ([facility]) Friddle, NP; Harris, NP; Kelly, NP; Logan, MD; Melvin, PA; Poth, MD; Ramadoss, MD; Stanton, NP [address], [place], [place] [address] [phone] Mon-Thur 8:00-5:30, Fri 8:00-5:00 Babies seen by providers at [facility] Accepting Medicaid  [place] ([address]) [facility] Dixon, PA; [place], MD; Pickard, MD; Tapia, PA [address], [place], [place] [address] [phone] Mon-Fri 8:00-5:00 Babies seen by providers at [facility] Accepting Medicaid   [place] ([address]) [facility] [place] Masneri, DO; Meyers, MD; Nelson, PA [address], [place], [place] [address] [phone] Mon-Fri 8:00-5:00 Babies seen by providers at [facility] Does NOT accept Medicaid Limited appointment availability, please call early in hospitalization  [facility] [place] Kunedd, DO; McGowen, MD [address], [place], [place] [address] [phone] Mon-Fri 8:00-5:00 Babies seen by [facility] providers Does NOT accept Medicaid [facility] [place] Cameron, MD; MacDonald, MD; Michaels, PA; Nayak, MD [address] [place] [address], [place], [place] [address] [phone] Mon-Fri 8:00-5:00 After hours clinic ([address] [place], [place] [address]) [phone] Mon-Fri 5:00-8:00, Sat 12:00-6:00, Sun 10:00-4:00 Babies seen by [facility] providers Accepting Medicaid [facility] [place] [address], [place], [place]  [address] [phone]   Fax - [phone]  [place] ([address]) [facility] [place] [facility] Andy, MD [address], [place], [place] [address] [phone] Mon-Fri 8:00-5:00 Babies seen by [facility] providers Does NOT accept Medicaid [facility] [place] ([facility] [place]) Eksir, MD [address], [place], [place] [address] [phone] Mon-Thur 8:00-7:00, Fri 8:00-5:00, Sat 8:00-12:00 Babies seen by providers at [facility] Accepting Medicaid - but does not have vaccinations in office (must be received elsewhere) Limited availability, please call early in hospitalization  [place] ([address]) [facility]  Charlene Flemming, MD [address], [place] [address] [phone]  Fax [phone]  [facility]  Kimberly Newton, MD, Annamarie Streilein, PA, Carla Hampton, PA [address] [place], [place]  [address] [phone] [facility]  [address], [place], [place] [address] [phone] [address], [place], [place] [address] [phone] (West Office)  [facility] [address], [place], [place] [address] [phone] [facility] [address] [place], [place], [place] [address] [phone] [facility] [address], [place], [place] [address] [phone] [facility] [address], [place], [place] [address] [phone] [facility] [address], [place], [place] [address] [phone] [facility] [address], [place], [place] [address] [phone] [facility]  [address], [place], [place] [address] [phone] Dr. [facility] Little [address] [place] [address], [place], [place] [address] [phone] [facility] [address], [place], [place] [address] [phone] [facility] [address], [place],  [address] [phone]  Safe Medications in Pregnancy   Acne:  Benzoyl Peroxide  Salicylic Acid   Backache/Headache:  Tylenol: 2 regular strength every 4 hours OR               2 Extra strength every 6 hours   Colds/Coughs/Allergies:  Benadryl (alcohol free) 25 mg every 6 hours as needed  Breath right strips  Claritin  Cepacol throat lozenges  Chloraseptic throat spray  Cold-Eeze- up to three times per day  Cough drops, alcohol free  Flonase (by prescription only)  Guaifenesin  Mucinex  Robitussin DM (plain only, alcohol free)  Saline nasal spray/drops  Sudafed (pseudoephedrine) & Actifed * use only after [redacted] weeks gestation and if you do not have high blood pressure  Tylenol  Vicks Vaporub  Zinc lozenges  Zyrtec   Constipation:  Colace  Ducolax suppositories  Fleet enema  Glycerin suppositories  Metamucil  Milk of magnesia  Miralax  Senokot  Smooth move tea   Diarrhea:  Kaopectate  Imodium A-D    *NO pepto Bismol   Hemorrhoids:  Anusol  Anusol HC  Preparation H  Tucks   Indigestion:  Tums  Maalox  Mylanta  Zantac  Pepcid   Insomnia:  Benadryl (alcohol free) 25mg every 6 hours as needed  Tylenol PM  Unisom, no Gelcaps   Leg Cramps:  Tums  MagGel   Nausea/Vomiting:  Bonine  Dramamine  Emetrol  Ginger extract  Sea bands  Meclizine  Nausea medication to take during pregnancy:  Unisom (doxylamine succinate 25 mg tablets) Take one tablet daily at bedtime. If symptoms are not adequately controlled, the dose can be increased to a maximum recommended dose of two tablets daily (1/2 tablet in the morning, 1/2 tablet mid-afternoon and one at bedtime).  Vitamin B6 100mg tablets. Take one tablet twice a day (up   to 200 mg per day).   Skin Rashes:  Aveeno products  Benadryl cream or 25mg every 6 hours as needed  Calamine Lotion  1% cortisone cream   Yeast infection:  Gyne-lotrimin 7  Monistat 7    **If taking multiple medications, please check labels to avoid duplicating the same active ingredients  **take medication as directed on the label  ** Do not exceed 4000 mg of tylenol in 24 hours  **Do not take medications that contain aspirin or ibuprofen           

## 2022-06-18 NOTE — Progress Notes (Signed)
New OB Intake  I connected with  Courtney Holloway on 06/18/22 at 10:15 AM EDT by MyChart Video Visit and verified that I am speaking with the correct person using two identifiers. Nurse is located at [facility] and pt is located at [employer].  I discussed the limitations, risks, security and privacy concerns of performing an evaluation and management service by telephone and the availability of in person appointments. I also discussed with the patient that there may be a patient responsible charge related to this service. The patient expressed understanding and agreed to proceed.  I explained I am completing New OB Intake today. We discussed her EDD of 01/11/23 that is based on U/S on 05/26/22. Pt is G1/P0. I reviewed her allergies, medications, Medical/Surgical/OB history, and appropriate screenings. I informed her of [facility] services. Based on history, this is a/an  pregnancy uncomplicated .   Patient Active Problem List   Diagnosis Date Noted   Elevated blood pressure reading without diagnosis of hypertension 01/13/2022   Enlarged tonsils 01/13/2022   Body mass index (BMI) of 39.0 to 39.9 in adult 09/02/2021   Irregular menses 08/19/2021   Prediabetes 08/04/2021   Nightmares associated with chronic post-traumatic stress disorder 04/01/2021   Food allergy 04/01/2021   Anal fissure 03/28/2021   Sleep disturbance 03/21/2021   Drug overdose, multiple drugs, undetermined intent, initial encounter 02/02/2021   Attention deficit hyperactivity disorder (ADHD), combined type 01/28/2021   MDD (major depressive disorder), recurrent severe, without psychosis (HCC) 01/16/2021   HSV-1 (herpes simplex virus 1) infection 08/02/2020   Menorrhagia with regular cycle 07/05/2020   Dyslipidemia 02/15/2018   Hidradenitis axillaris 07/11/2016   Failed vision screen 11/21/2014   Allergic rhinitis 11/21/2014   Slow transit constipation 11/21/2014   Asthma 05/04/2013   GAD (generalized anxiety disorder) 11/02/2012    Post-traumatic stress disorder 11/02/2012    Concerns addressed today  Delivery Plans:  [facility] deliver [facility].   MyChart/Babyscripts MyChart access verified. I explained pt will have some visits in office and some virtually. Babyscripts instructions given and order placed. Patient verifies receipt of registration text/e-mail. Account successfully created and app downloaded.  Blood Pressure Cuff  Pt has own BP Cuff Explained after first prenatal appt pt will check weekly and document in Babyscripts.  Weight scale: Patient does / does not  have weight scale. Weight scale ordered for patient to pick up from [employer].   Anatomy US Explained first scheduled [place] will be around 19 weeks. Anatomy US scheduled for 08/19/22 at 0945. Pt notified to arrive at 0930. Scheduled AFP lab only appointment if CenteringPregnancy pt for same day as anatomy US.   Labs Discussed Avelina Laine genetic screening with patient. Would like both Panorama and Horizon drawn at new OB visit.Also if interested in genetic testing, tell patient she will need AFP 15-21 weeks to complete genetic testing .Routine prenatal labs needed.  Covid Vaccine Patient has covid vaccine.   Is patient a CenteringPregnancy candidate?  Accepted Declined due to NA Not a candidate due to NA Centering Patient" indicated on sticky note   Is patient a Mom+Baby Combined Care candidate?  Centering     Scheduled with Mom+Baby provider    Is patient interested in Waterbirth?  No   "Interested in [employer] - Schedule next visit with CNM" on sticky note  Informed patient of [facility] website  and placed link in her AVS.   Social Determinants of Health Food Insecurity: Patient denies food insecurity. WIC Referral:  Patient is interested in referral to [facility].  Transportation: Patient denies transportation needs. Childcare: Discussed no children allowed at ultrasound appointments. Offered childcare services; patient declines  childcare services at this time.  Send link to Pregnancy Navigators   Placed OB Box on problem list and updated  First visit review I reviewed new OB appt with pt. I explained she will have a pelvic exam, ob bloodwork with genetic screening, and PAP smear. Explained pt will be seen by Corlis Hove, NP at first visit; encounter routed to appropriate provider. Explained that patient will be seen by pregnancy navigator following visit with provider. [facility] information placed in AVS.   Henrietta Dine, CMA 06/18/2022  10:44 AM

## 2022-06-25 ENCOUNTER — Encounter: Payer: Self-pay | Admitting: Family

## 2022-06-25 ENCOUNTER — Inpatient Hospital Stay (HOSPITAL_COMMUNITY)
Admission: AD | Admit: 2022-06-25 | Discharge: 2022-06-25 | Disposition: A | Discharge status: Home / Self Care | Payer: Medicaid Other | Attending: Obstetrics & Gynecology | Admitting: Obstetrics & Gynecology

## 2022-06-25 ENCOUNTER — Encounter (HOSPITAL_COMMUNITY): Payer: Self-pay | Admitting: Obstetrics & Gynecology

## 2022-06-25 ENCOUNTER — Other Ambulatory Visit (HOSPITAL_COMMUNITY): Payer: Self-pay

## 2022-06-25 DIAGNOSIS — O23591 Infection of other part of genital tract in pregnancy, first trimester: Secondary | ICD-10-CM | POA: Insufficient documentation

## 2022-06-25 DIAGNOSIS — K219 Gastro-esophageal reflux disease without esophagitis: Secondary | ICD-10-CM | POA: Insufficient documentation

## 2022-06-25 DIAGNOSIS — K209 Esophagitis, unspecified without bleeding: Secondary | ICD-10-CM

## 2022-06-25 DIAGNOSIS — O99331 Smoking (tobacco) complicating pregnancy, first trimester: Secondary | ICD-10-CM | POA: Insufficient documentation

## 2022-06-25 DIAGNOSIS — O219 Vomiting of pregnancy, unspecified: Secondary | ICD-10-CM

## 2022-06-25 DIAGNOSIS — K920 Hematemesis: Secondary | ICD-10-CM | POA: Diagnosis present

## 2022-06-25 DIAGNOSIS — O99281 Endocrine, nutritional and metabolic diseases complicating pregnancy, first trimester: Secondary | ICD-10-CM | POA: Diagnosis not present

## 2022-06-25 DIAGNOSIS — O99211 Obesity complicating pregnancy, first trimester: Secondary | ICD-10-CM | POA: Diagnosis not present

## 2022-06-25 DIAGNOSIS — O99341 Other mental disorders complicating pregnancy, first trimester: Secondary | ICD-10-CM | POA: Diagnosis not present

## 2022-06-25 DIAGNOSIS — O99511 Diseases of the respiratory system complicating pregnancy, first trimester: Secondary | ICD-10-CM | POA: Diagnosis not present

## 2022-06-25 DIAGNOSIS — O99321 Drug use complicating pregnancy, first trimester: Secondary | ICD-10-CM | POA: Diagnosis not present

## 2022-06-25 DIAGNOSIS — O99611 Diseases of the digestive system complicating pregnancy, first trimester: Secondary | ICD-10-CM | POA: Insufficient documentation

## 2022-06-25 DIAGNOSIS — A5901 Trichomonal vulvovaginitis: Secondary | ICD-10-CM | POA: Insufficient documentation

## 2022-06-25 DIAGNOSIS — Z3480 Encounter for supervision of other normal pregnancy, unspecified trimester: Secondary | ICD-10-CM

## 2022-06-25 DIAGNOSIS — Z3A11 11 weeks gestation of pregnancy: Secondary | ICD-10-CM | POA: Diagnosis not present

## 2022-06-25 DIAGNOSIS — E860 Dehydration: Secondary | ICD-10-CM | POA: Diagnosis not present

## 2022-06-25 DIAGNOSIS — O98311 Other infections with a predominantly sexual mode of transmission complicating pregnancy, first trimester: Secondary | ICD-10-CM | POA: Diagnosis not present

## 2022-06-25 LAB — CBC
HCT: 32.8 % — ABNORMAL LOW (ref 36.0–46.0)
Hemoglobin: 11.2 g/dL — ABNORMAL LOW (ref 12.0–15.0)
MCH: 27.6 pg (ref 26.0–34.0)
MCHC: 34.1 g/dL (ref 30.0–36.0)
MCV: 80.8 fL (ref 80.0–100.0)
Platelets: 372 10*3/uL (ref 150–400)
RBC: 4.06 MIL/uL (ref 3.87–5.11)
RDW: 15.3 % (ref 11.5–15.5)
WBC: 13.9 10*3/uL — ABNORMAL HIGH (ref 4.0–10.5)
nRBC: 0 % (ref 0.0–0.2)

## 2022-06-25 LAB — URINALYSIS, ROUTINE W REFLEX MICROSCOPIC
Bilirubin Urine: NEGATIVE
Glucose, UA: NEGATIVE mg/dL
Hgb urine dipstick: NEGATIVE
Ketones, ur: 20 mg/dL — AB
Nitrite: NEGATIVE
Protein, ur: 30 mg/dL — AB
Specific Gravity, Urine: 1.033 — ABNORMAL HIGH (ref 1.005–1.030)
pH: 5 (ref 5.0–8.0)

## 2022-06-25 LAB — COMPREHENSIVE METABOLIC PANEL
ALT: 13 U/L (ref 0–44)
AST: 15 U/L (ref 15–41)
Albumin: 3.5 g/dL (ref 3.5–5.0)
Alkaline Phosphatase: 32 U/L — ABNORMAL LOW (ref 38–126)
Anion gap: 10 (ref 5–15)
BUN: <5 mg/dL — ABNORMAL LOW (ref 6–20)
CO2: 20 mmol/L — ABNORMAL LOW (ref 22–32)
Calcium: 9 mg/dL (ref 8.9–10.3)
Chloride: 104 mmol/L (ref 98–111)
Creatinine, Ser: 0.65 mg/dL (ref 0.44–1.00)
GFR, Estimated: >60 mL/min (ref >60)
Glucose, Bld: 143 mg/dL — ABNORMAL HIGH (ref 70–99)
Potassium: 3.3 mmol/L — ABNORMAL LOW (ref 3.5–5.1)
Sodium: 134 mmol/L — ABNORMAL LOW (ref 135–145)
Total Bilirubin: 0.5 mg/dL (ref 0.3–1.2)
Total Protein: 6.6 g/dL (ref 6.5–8.1)

## 2022-06-25 LAB — WET PREP, GENITAL
Clue Cells Wet Prep HPF POC: NONE SEEN
Sperm: NONE SEEN
WBC, Wet Prep HPF POC: >=10 — AB (ref <10)

## 2022-06-25 MED ORDER — ALUM & MAG HYDROXIDE-SIMETH 200-200-20 MG/5ML PO SUSP
30.0000 mL | Freq: Once | ORAL | Status: AC
  Administered 2022-06-25: 30 mL via ORAL
  Filled 2022-06-25: qty 30

## 2022-06-25 MED ORDER — PROMETHAZINE HCL 25 MG PO TABS
25.0000 mg | ORAL_TABLET | Freq: Four times a day (QID) | ORAL | 0 refills | Status: DC | PRN
  Filled 2022-06-25: qty 30, 8d supply, fill #0

## 2022-06-25 MED ORDER — FAMOTIDINE IN NACL 20-0.9 MG/50ML-% IV SOLN
20.0000 mg | Freq: Once | INTRAVENOUS | Status: AC
  Administered 2022-06-25: 20 mg via INTRAVENOUS
  Filled 2022-06-25: qty 50

## 2022-06-25 MED ORDER — PROMETHAZINE HCL 25 MG/ML IJ SOLN
25.0000 mg | Freq: Once | INTRAMUSCULAR | Status: AC
  Administered 2022-06-25: 25 mg via INTRAVENOUS
  Filled 2022-06-25: qty 1

## 2022-06-25 MED ORDER — ONDANSETRON HCL 4 MG/2ML IJ SOLN
4.0000 mg | Freq: Once | INTRAMUSCULAR | Status: AC | PRN
  Administered 2022-06-25: 4 mg via INTRAVENOUS
  Filled 2022-06-25: qty 2

## 2022-06-25 MED ORDER — FAMOTIDINE 20 MG PO TABS
20.0000 mg | ORAL_TABLET | Freq: Two times a day (BID) | ORAL | 0 refills | Status: DC
  Filled 2022-06-25: qty 30, 15d supply, fill #0

## 2022-06-25 MED ORDER — METRONIDAZOLE 500 MG PO TABS
500.0000 mg | ORAL_TABLET | Freq: Two times a day (BID) | ORAL | 0 refills | Status: AC
  Filled 2022-06-25: qty 14, 7d supply, fill #0

## 2022-06-25 MED ORDER — VITAMIN B-6 50 MG PO TABS
100.0000 mg | ORAL_TABLET | Freq: Every day | ORAL | 0 refills | Status: DC
  Filled 2022-06-25 – 2022-07-02 (×2): qty 100, 50d supply, fill #0

## 2022-06-25 MED ORDER — ONDANSETRON 4 MG PO TBDP
4.0000 mg | ORAL_TABLET | Freq: Four times a day (QID) | ORAL | 0 refills | Status: DC | PRN
  Filled 2022-06-25: qty 20, 5d supply, fill #0

## 2022-06-25 MED ORDER — PYRIDOXINE HCL 100 MG/ML IJ SOLN
100.0000 mg | Freq: Once | INTRAMUSCULAR | Status: AC
Start: 2022-06-25 — End: 2022-06-25
  Administered 2022-06-25: 100 mg via INTRAVENOUS
  Filled 2022-06-25: qty 1

## 2022-06-25 MED ORDER — LACTATED RINGERS IV SOLN: Freq: Once | INTRAVENOUS | Status: AC

## 2022-06-25 MED ORDER — SCOPOLAMINE 1 MG/3DAYS TD PT72
1.0000 | MEDICATED_PATCH | Freq: Once | TRANSDERMAL | Status: DC
  Administered 2022-06-25: 1.5 mg via TRANSDERMAL
  Filled 2022-06-25: qty 1

## 2022-06-25 MED ORDER — METRONIDAZOLE 500 MG PO TABS
500.0000 mg | ORAL_TABLET | Freq: Once | ORAL | Status: AC
  Administered 2022-06-25: 500 mg via ORAL
  Filled 2022-06-25: qty 1

## 2022-06-25 NOTE — Progress Notes (Signed)
Written and verbal d/c instructions given and understanding voiced. 

## 2022-06-25 NOTE — MAU Note (Signed)
.  Courtney Holloway is a 21 y.o. at [redacted]w[redacted]d here in MAU reporting n/v with blood in emesis. Burning in chest and stomach and I cannot eat or sleep. Have Reglan and Diclegis but are not helping anymore.   Onset of complaint: 2days Pain score: 8 Vitals:   06/25/22 0029 06/25/22 0033  BP:  132/76  Pulse: (!) 101   Resp: 18   Temp: 98.7 F (37.1 C)   SpO2: 99%      FHT:n/a Lab orders placed from triage:  u/a

## 2022-06-25 NOTE — MAU Note (Addendum)
Pt now reports a lot of vaginal d/c with bad odor. Used OTC yeast treatment but did not help. Hurts when urinates and "feels like something is torn down there". Wynelle Bourgeois CNM aware and wet prep collected.

## 2022-06-25 NOTE — MAU Provider Note (Signed)
Chief Complaint: Emesis During Pregnancy   Event Date/Time   First Provider Initiated Contact with Patient 06/25/22 0047        SUBJECTIVE HPI: Courtney Holloway is a 21 y.o. G1P0 at [redacted]w[redacted]d by LMP who presents to maternity admissions reporting persistent nausea and vomiting with hematemesis and burning in her esophagus.  States Reglan and diclegis would not stay down.  . She denies vaginal bleeding, vaginal itching/burning, urinary symptoms, h/a, dizziness, or fever/chills.    Emesis  This is a recurrent problem. The problem has been unchanged. There has been no fever. Associated symptoms include weight loss. Pertinent negatives include no abdominal pain (but has burning in esophagus), diarrhea, fever or myalgias. Treatments tried: Reglan and Diclegis.   RN Note: JONELL BRUMBAUGH is a 21 y.o. at [redacted]w[redacted]d here in MAU reporting n/v with blood in emesis. Burning in chest and stomach and I cannot eat or sleep. Have Reglan and Diclegis but are not helping anymore.   Onset of complaint: 2days Pain score: 8  Past Medical History:  Diagnosis Date   Allergy    Anxiety    Phreesia 10/14/2020   Asthma    Asthma    Phreesia 10/14/2020   Depression    Depression    Phreesia 10/14/2020   DMDD (disruptive mood dysregulation disorder) (HCC) 12/28/2015   Failed vision screen 11/21/2014   GAD (generalized anxiety disorder)    Headache(784.0)    Mental disorder    Obesity 11/21/2014   Vision abnormalities    wears glasses   Past Surgical History:  Procedure Laterality Date   NO PAST SURGERIES     Social History   Socioeconomic History   Marital status: Single    Spouse name: Not on file   Number of children: Not on file   Years of education: Not on file   Highest education level: Not on file  Occupational History   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 Reports she has tried marijuana 4 times.     Senior at [employer]. Lives in foster care.    Social Determinants of Health   Financial Resource Strain: Not on file  Food Insecurity: Not on file  Transportation Needs: Not on file  Physical Activity: Not on file  Stress: Not on file  Social Connections: Not on file  Intimate Partner Violence: Not on file   No current facility-administered medications on file prior to encounter.   Current Outpatient Medications on File Prior to Encounter  Medication Sig Dispense Refill   albuterol (VENTOLIN HFA) 108 (90 Base) MCG/ACT inhaler Inhale 2 puffs into the lungs every 4 (four) hours as needed for wheezing or shortness of breath. 18 g 1   Doxylamine-Pyridoxine (DICLEGIS) 10-10 MG TBEC Take 1-2 tablets by mouth at bedtime. 60 tablet 3   Elastic Bandages & Supports (COMFORT FIT MATERNITY SUPP LG) MISC 1 Application by  Does not apply route daily. 1 each 0   EPINEPHrine (EPIPEN 2-PAK) 0.3 mg/0.3 mL IJ SOAJ injection Inject 0.3 mg into the muscle as needed for anaphylaxis. 1 each 1   metoCLOPramide (REGLAN) 10 MG tablet Take 1 tablet (10 mg total) by mouth every 8 (eight) hours as needed for nausea. 30 tablet 1   Prenatal MV & Min w/FA-DHA (PRENATAL GUMMIES) 0.18-25 MG CHEW Chew by mouth.     Allergies  Allergen Reactions   Other Anaphylaxis and Itching    Seasonal allergies, dog fur, certain detergents  Pt. Reports mother has used "epi-pen" for past allergies.    Pineapple Anaphylaxis   Fish-Derived Products Diarrhea, Hives and Itching   Zofran [Ondansetron Hcl] Nausea And Vomiting     I have reviewed patient's Past Medical Hx, Surgical Hx, Family Hx, Social Hx, medications and allergies.   ROS:  Review of Systems  Constitutional:  Positive for weight loss. Negative for fever.  Gastrointestinal:  Positive for constipation and vomiting. Negative for abdominal pain (but has burning in esophagus) and diarrhea.       Weight loss   Genitourinary:  Positive for vaginal discharge (with vaginal irritation). Negative for dysuria.  Musculoskeletal:  Negative for myalgias.   Review of Systems  Other systems negative   Physical Exam  Physical Exam Patient Vitals for the past 24 hrs:  BP Temp Pulse Resp SpO2 Height Weight  06/25/22 0033 132/76 -- -- -- -- -- --  06/25/22 0029 -- 98.7 F (37.1 C) (!) 101 18 99 % 5\' 1"  (1.549 m) 103.4 kg   Constitutional: Well-developed, well-nourished female in no acute distress.  Cardiovascular: normal rate Respiratory: normal effort GI: Abd soft, non-tender. Pos BS x 4 MS: Extremities nontender, no edema, normal ROM Neurologic: Alert and oriented x 4.  GU: Neg CVAT.  LAB RESULTS Results for orders placed or performed during the hospital encounter of 06/25/22 (from the past 24 hour(s))  Urinalysis, Routine w reflex microscopic Urine, Clean Catch     Status: Abnormal   Collection Time: 06/25/22 12:40 AM  Result Value Ref Range   Color, Urine AMBER (A) YELLOW   APPearance HAZY (A) CLEAR   Specific Gravity, Urine 1.033 (H) 1.005 - 1.030   pH 5.0 5.0 - 8.0   Glucose, UA NEGATIVE NEGATIVE mg/dL   Hgb urine dipstick NEGATIVE NEGATIVE   Bilirubin Urine NEGATIVE NEGATIVE   Ketones, ur 20 (A) NEGATIVE mg/dL   Protein, ur 30 (A) NEGATIVE mg/dL   Nitrite NEGATIVE NEGATIVE   Leukocytes,Ua LARGE (A) NEGATIVE   RBC / HPF 6-10 0 - 5 RBC/hpf   WBC, UA 21-50 0 - 5 WBC/hpf   Bacteria, UA FEW (A) NONE SEEN   Squamous Epithelial / LPF 0-5 0 - 5   Mucus PRESENT   CBC     Status: Abnormal   Collection Time: 06/25/22  1:02 AM  Result Value  Ref Range   WBC 13.9 (H) 4.0 - 10.5 K/uL   RBC 4.06 3.87 - 5.11 MIL/uL   Hemoglobin 11.2 (L) 12.0 - 15.0 g/dL   HCT 08/26/22 (L) 86.7 - 61.9 %   MCV 80.8 80.0 - 100.0 fL   MCH 27.6 26.0 - 34.0 pg   MCHC 34.1 30.0 - 36.0 g/dL   RDW 50.9 32.6 - 71.2 %   Platelets 372 150 - 400 K/uL   nRBC 0.0 0.0 - 0.2 %  Comprehensive metabolic panel     Status: Abnormal   Collection Time: 06/25/22  1:02 AM  Result Value Ref Range   Sodium 134 (L) 135 - 145 mmol/L   Potassium 3.3 (L) 3.5 - 5.1 mmol/L   Chloride 104 98 - 111 mmol/L   CO2 20 (L) 22 - 32 mmol/L   Glucose, Bld 143 (H) 70 - 99 mg/dL   BUN <5 (L) 6 - 20 mg/dL   Creatinine, Ser 3.87 0.44 - 1.00 mg/dL   Calcium 9.0 8.9 - 56.4 mg/dL   Total Protein 6.6 6.5 - 8.1 g/dL   Albumin 3.5 3.5 - 5.0 g/dL   AST 15 15 - 41 U/L   ALT 13 0 - 44 U/L   Alkaline Phosphatase 32 (L) 38 - 126 U/L   Total Bilirubin 0.5 0.3 - 1.2 mg/dL   GFR, Estimated >33 >29 mL/min   Anion gap 10 5 - 15  Wet prep, genital     Status: Abnormal   Collection Time: 06/25/22  1:15 AM  Result Value Ref Range   Yeast Wet Prep HPF POC PRESENT (A) NONE SEEN   Trich, Wet Prep PRESENT (A) NONE SEEN   Clue Cells Wet Prep HPF POC NONE SEEN NONE SEEN   WBC, Wet Prep HPF POC >=10 (A) <10   Sperm NONE SEEN      --/--/B POS (06/12 1157)  IMAGING  MAU Management/MDM: I have reviewed the triage vital signs and the nursing notes.   Pertinent labs & imaging results that were available during my care of the patient were reviewed by me and considered in my medical decision making (see chart for details).      I have reviewed her medical records including past results, notes and treatments.   DIscussed rehydration and meds for nausea and vomiting.  Tolerated them well and was able to keep down PO meds.   Informed her that her wet prep showed Trichomonas and reviewed treatment plan and advised her to notify her partner and he needs treatment also.  Ordered IV fluids, Pepcid, Zofran,  Phenergan, B6, Scopolamine, for nausea.  We gave her a first dose of Flagyl for Trichomonal vaginitis.    ASSESSMENT SIngle IUP at [redacted]w[redacted]d Nausea and vomiting of pregnancy Dehydration Trichomonal vaginitis  PLAN Discharge home New Rx given for Zofran, Phenergan and Scopolamine for nausea Rx Pepcid for acid reflux Rx Flagyl for Trichomonal vaginitis Advance diet as tolerated  Pt stable at time of discharge. Encouraged to return here if she develops worsening of symptoms, increase in pain, fever, or other concerning symptoms.    Wynelle Bourgeois CNM, MSN Certified Nurse-Midwife 06/25/2022  12:48 AM

## 2022-07-02 ENCOUNTER — Ambulatory Visit (INDEPENDENT_AMBULATORY_CARE_PROVIDER_SITE_OTHER): Payer: Medicaid Other | Admitting: Student

## 2022-07-02 ENCOUNTER — Encounter: Payer: Self-pay | Admitting: *Deleted

## 2022-07-02 ENCOUNTER — Other Ambulatory Visit (HOSPITAL_COMMUNITY)
Admission: RE | Admit: 2022-07-02 | Discharge: 2022-07-02 | Disposition: A | Discharge status: Home / Self Care | Payer: Medicaid Other | Source: Ambulatory Visit | Attending: Student | Admitting: Student

## 2022-07-02 ENCOUNTER — Encounter: Payer: Self-pay | Admitting: Student

## 2022-07-02 ENCOUNTER — Telehealth: Payer: Self-pay | Admitting: *Deleted

## 2022-07-02 ENCOUNTER — Other Ambulatory Visit: Payer: Self-pay | Admitting: Advanced Practice Midwife

## 2022-07-02 VITALS — BP 111/71 | HR 112 | Wt 228.2 lb

## 2022-07-02 DIAGNOSIS — A5901 Trichomonal vulvovaginitis: Secondary | ICD-10-CM

## 2022-07-02 DIAGNOSIS — O23591 Infection of other part of genital tract in pregnancy, first trimester: Secondary | ICD-10-CM

## 2022-07-02 DIAGNOSIS — Z3401 Encounter for supervision of normal first pregnancy, first trimester: Secondary | ICD-10-CM

## 2022-07-02 DIAGNOSIS — O219 Vomiting of pregnancy, unspecified: Secondary | ICD-10-CM

## 2022-07-02 DIAGNOSIS — Z1331 Encounter for screening for depression: Secondary | ICD-10-CM

## 2022-07-02 DIAGNOSIS — Z3A12 12 weeks gestation of pregnancy: Secondary | ICD-10-CM

## 2022-07-02 DIAGNOSIS — Z6841 Body Mass Index (BMI) 40.0 and over, adult: Secondary | ICD-10-CM

## 2022-07-02 MED ORDER — ASPIRIN 81 MG PO TBEC: 81.0000 mg | DELAYED_RELEASE_TABLET | Freq: Every day | ORAL | 2 refills | Status: DC

## 2022-07-02 NOTE — Progress Notes (Signed)
Pt had positive depression score on PHQ-9 & GAD 7// Hx of depression and Mental disorders.

## 2022-07-02 NOTE — Telephone Encounter (Signed)
I called Courtney Holloway to remind her of her CenteringPregnancy appointment on 8/2 and information re: that appointment. I left message I was calling with some information and since I did not reach her , I will send MyChart message- please read message and if questions, let us know. Nancy Fetter

## 2022-07-02 NOTE — Progress Notes (Signed)
History:   Courtney Holloway is a 21 y.o. G1P0 at [redacted]w[redacted]d by early ultrasound being seen today for her first obstetrical visit.  Her obstetrical history is significant for obesity. Pregnancy history fully reviewed.  Patient reports fatigue, nausea, and vomiting. Patient is holding down liquids and solids, but will occasionally throw-up and feels constant nausea. Patient reports to provider that they are taking all medications as recommended, however, noted during check-in process w/ CMA that they were no longer taking medications. Patient shares that she feels "stuck" in this pregnancy and is still processing how to move forward with this. Patient states that the "baby hates her" and that is why she is experiencing so much nausea and vomiting. Patient reports feeling depressed and wanting to sleep all the time.      HISTORY: OB History  Gravida Para Term Preterm AB Living  1 0 0 0 0 0  SAB IAB Ectopic Multiple Live Births  0 0 0 0 0    # Outcome Date GA Lbr Len/2nd Weight Sex Delivery Anes PTL Lv  1 Current             Last pap smear was done n/a and was  n/a  Past Medical History:  Diagnosis Date   Allergy    Anxiety    Phreesia 10/14/2020   Asthma    Asthma    Phreesia 10/14/2020   Depression    Depression    Phreesia 10/14/2020   DMDD (disruptive mood dysregulation disorder) (HCC) 12/28/2015   Failed vision screen 11/21/2014   GAD (generalized anxiety disorder)    Headache(784.0)    Mental disorder    Obesity 11/21/2014   Vision abnormalities    wears glasses   Past Surgical History:  Procedure Laterality Date   NO PAST SURGERIES     Family History  Problem Relation Age of Onset   Diabetes Mother    Bipolar disorder Mother    Schizophrenia Mother    Multiple sclerosis Mother    Diabetes Father    Bipolar disorder Maternal Grandmother    Alcohol abuse Maternal Grandmother    Social History   Tobacco Use   Smoking status: Former    Types: E-cigarettes    Quit  date: 04/13/2022    Years since quitting: 0.2   Smokeless tobacco: Never  Vaping Use   Vaping Use: Former  Substance Use Topics   Alcohol use: Not Currently   Drug use: Not Currently    Types: Marijuana    Comment: 2021   Allergies  Allergen Reactions   Other Anaphylaxis and Itching    Seasonal allergies, dog fur, certain detergents  Pt. Reports mother has used "epi-pen" for past allergies.    Pineapple Anaphylaxis   Fish-Derived Products Diarrhea, Hives and Itching   Zofran [Ondansetron Hcl] Nausea And Vomiting   Current Outpatient Medications on File Prior to Visit  Medication Sig Dispense Refill   albuterol (VENTOLIN HFA) 108 (90 Base) MCG/ACT inhaler Inhale 2 puffs into the lungs every 4 (four) hours as needed for wheezing or shortness of breath. 18 g 1   EPINEPHrine (EPIPEN 2-PAK) 0.3 mg/0.3 mL IJ SOAJ injection Inject 0.3 mg into the muscle as needed for anaphylaxis. 1 each 1   famotidine (PEPCID) 20 MG tablet Take 1 tablet (20 mg total) by mouth 2 (two) times daily. 30 tablet 0   Prenatal MV & Min w/FA-DHA (PRENATAL GUMMIES) 0.18-25 MG CHEW Chew by mouth.     Doxylamine-Pyridoxine (DICLEGIS) 10-10  MG TBEC Take 1-2 tablets by mouth at bedtime. (Patient not taking: Reported on 07/02/2022) 60 tablet 3   Elastic Bandages & Supports (COMFORT FIT MATERNITY SUPP LG) MISC 1 Application by Does not apply route daily. (Patient not taking: Reported on 07/02/2022) 1 each 0   metoCLOPramide (REGLAN) 10 MG tablet Take 1 tablet (10 mg total) by mouth every 8 (eight) hours as needed for nausea. (Patient not taking: Reported on 07/02/2022) 30 tablet 1   metroNIDAZOLE (FLAGYL) 500 MG tablet Take 1 tablet (500 mg total) by mouth 2 (two) times daily for 7 days. 14 tablet 0   ondansetron (ZOFRAN-ODT) 4 MG disintegrating tablet Take 1 tablet (4 mg total) by mouth every 6 (six) hours as needed for nausea. (Patient not taking: Reported on 07/02/2022) 20 tablet 0   promethazine (PHENERGAN) 25 MG tablet Take 1  tablet (25 mg total) by mouth every 6 (six) hours as needed for nausea or vomiting. (Patient not taking: Reported on 07/02/2022) 30 tablet 0   pyridOXINE (VITAMIN B-6) 50 MG tablet Take 2 tablets (100 mg total) by mouth daily. (Patient not taking: Reported on 07/02/2022) 60 tablet 0   No current facility-administered medications on file prior to visit.    Review of Systems Pertinent items noted in HPI and remainder of comprehensive ROS otherwise negative. Physical Exam:   Vitals:   07/02/22 0912  BP: 111/71  Pulse: (!) 112  Weight: 228 lb 3.2 oz (103.5 kg)   Fetal Heart Rate (bpm): 166 Uterus:     Pelvic Exam: Not done at today's visit                       System: General: well-developed, well-nourished female in no acute distress   Breasts:  normal appearance, no masses or tenderness bilaterally   Skin: normal coloration and turgor, no rashes   Neurologic: oriented, normal, negative, normal mood   Extremities: normal strength, tone, and muscle mass, ROM of all joints is normal   HEENT PERRLA, extraocular movement intact and sclera clear, anicteric   Mouth/Teeth mucous membranes moist, pharynx normal without lesions and dental hygiene good   Neck supple and no masses   Cardiovascular: regular rate and rhythm   Respiratory:  no respiratory distress, normal breath sounds   Abdomen: soft, non-tender; bowel sounds normal; no masses,  no organomegaly  Bedside Ultrasound for FHR check: Patient informed that the ultrasound is considered a limited obstetric ultrasound and is not intended to be a complete ultrasound exam.  Patient also informed that the ultrasound is not being completed with the intent of assessing for fetal or placental anomalies or any pelvic abnormalities.  Explained that the purpose of today's ultrasound is to assess for fetal heart rate.  Patient acknowledges the purpose of the exam and the limitations of the study.     Assessment:    Pregnancy: G1P0 Patient  Active Problem List   Diagnosis Date Noted   Supervision of other normal pregnancy, antepartum 06/18/2022   Elevated blood pressure reading without diagnosis of hypertension 01/13/2022   Enlarged tonsils 01/13/2022   Body mass index (BMI) of 39.0 to 39.9 in adult 09/02/2021   Irregular menses 08/19/2021   Prediabetes 08/04/2021   Nightmares associated with chronic post-traumatic stress disorder 04/01/2021   Food allergy 04/01/2021   Anal fissure 03/28/2021   Sleep disturbance 03/21/2021   Drug overdose, multiple drugs, undetermined intent, initial encounter 02/02/2021   Attention deficit hyperactivity disorder (ADHD), combined type 01/28/2021  MDD (major depressive disorder), recurrent severe, without psychosis (HCC) 01/16/2021   HSV-1 (herpes simplex virus 1) infection 08/02/2020   Menorrhagia with regular cycle 07/05/2020   Dyslipidemia 02/15/2018   Hidradenitis axillaris 07/11/2016   Failed vision screen 11/21/2014   Allergic rhinitis 11/21/2014   Slow transit constipation 11/21/2014   Asthma 05/04/2013   GAD (generalized anxiety disorder) 11/02/2012   Post-traumatic stress disorder 11/02/2012     Plan:    1. Encounter for supervision of normal first pregnancy in first trimester - Culture, OB Urine - GC/Chlamydia probe amp ([place])not at [facility] - CBC/D/Plt+RPR+Rh+ABO+RubIgG... - Hemoglobin A1c - Panorama Prenatal Test Full Panel - HORIZON CUSTOM - Ambulatory referral to Integrated Behavioral Health - aspirin EC 81 MG tablet; Take 1 tablet (81 mg total) by mouth daily. Take after 12 weeks for prevention of preeclampsia later in pregnancy  Dispense: 300 tablet; Refill: 2  2. [redacted] weeks gestation of pregnancy - Provided with guidance on routine follow-up during second trimester - Anatomy US is scheduled, discussed expectations - aspirin EC 81 MG tablet; Take 1 tablet (81 mg total) by mouth daily. Take after 12 weeks for prevention of preeclampsia later in pregnancy   Dispense: 300 tablet; Refill: 2  3. Positive depression screening - Offered additional support and resources, patient prefers working with [facility] - Patient has extensive mental health history  - Ambulatory referral to Integrated Behavioral Health  4. Nausea/vomiting in pregnancy - Patient had medications adjusted within the last 7 days, encouraged to keep trying new regimen  - Patient was on PO abx this past week and this could have potentially been a contributing factor to current symptoms - Encouraged patient to follow-up in MAU if unable to hold anything down or drink liquids - Provided reassurance that medical team will continue to support patient through this difficult time and hoping symptoms decrease further into next trimester  5. Class 3 severe obesity with body mass index (BMI) of 40.0 to 44.9 in adult, unspecified obesity type, unspecified whether serious comorbidity present [facility]) - Discussed recommendation for taking ASA at 12 weeks, patient verbalized understanding - Patient will need serial growth [place] starting at 24 weeks per MFM guidelines - aspirin EC 81 MG tablet; Take 1 tablet (81 mg total) by mouth daily. Take after 12 weeks for prevention of preeclampsia later in pregnancy  Dispense: 300 tablet; Refill: 2  6. Trichomonal vaginitis during pregnancy in first trimester - completed PO antibiotic therapy this am - Denies any symptoms at today's visit   Initial labs drawn. Continue prenatal vitamins. Genetic Screening discussed, First trimester screen, Quad screen, and NIPS: requested. Ultrasound discussed; fetal anatomic survey:  scheduled . Problem list reviewed and updated. The nature of [facility] with multiple MDs and other Advanced Practice Providers was explained to patient; also emphasized that residents, students are part of our team. Routine obstetric precautions reviewed. Return in about 4 weeks (around 07/30/2022) for LOB,  [email or web address]    , NP    Faculty Practice Center for Corlis Hove, [facility]

## 2022-07-03 ENCOUNTER — Other Ambulatory Visit (HOSPITAL_COMMUNITY): Payer: Self-pay

## 2022-07-03 LAB — HEMOGLOBIN A1C
Est. average glucose Bld gHb Est-mCnc: 117 mg/dL
Hgb A1c MFr Bld: 5.7 % — ABNORMAL HIGH (ref 4.8–5.6)

## 2022-07-03 LAB — CBC/D/PLT+RPR+RH+ABO+RUBIGG...
Antibody Screen: NEGATIVE
Basophils Absolute: 0 10*3/uL (ref 0.0–0.2)
Basos: 0 %
EOS (ABSOLUTE): 0.1 10*3/uL (ref 0.0–0.4)
Eos: 1 %
HCV Ab: NONREACTIVE
HIV Screen 4th Generation wRfx: NONREACTIVE
Hematocrit: 34.7 % (ref 34.0–46.6)
Hemoglobin: 11.7 g/dL (ref 11.1–15.9)
Hepatitis B Surface Ag: NEGATIVE
Immature Grans (Abs): 0 10*3/uL (ref 0.0–0.1)
Immature Granulocytes: 1 %
Lymphocytes Absolute: 1.5 10*3/uL (ref 0.7–3.1)
Lymphs: 18 %
MCH: 27.2 pg (ref 26.6–33.0)
MCHC: 33.7 g/dL (ref 31.5–35.7)
MCV: 81 fL (ref 79–97)
Monocytes Absolute: 0.8 10*3/uL (ref 0.1–0.9)
Monocytes: 10 %
Neutrophils Absolute: 6.1 10*3/uL (ref 1.4–7.0)
Neutrophils: 70 %
Platelets: 407 10*3/uL (ref 150–450)
RBC: 4.3 x10E6/uL (ref 3.77–5.28)
RDW: 15.1 % (ref 11.7–15.4)
RPR Ser Ql: NONREACTIVE
Rh Factor: POSITIVE
Rubella Antibodies, IGG: 1.99 index (ref >0.99)
WBC: 8.6 10*3/uL (ref 3.4–10.8)

## 2022-07-03 LAB — GC/CHLAMYDIA PROBE AMP (~~LOC~~) NOT AT ARMC
Chlamydia: NEGATIVE
Comment: NEGATIVE
Comment: NORMAL
Neisseria Gonorrhea: NEGATIVE

## 2022-07-03 LAB — HCV INTERPRETATION

## 2022-07-03 MED ORDER — ONDANSETRON 4 MG PO TBDP: 4.0000 mg | ORAL_TABLET | Freq: Four times a day (QID) | ORAL | 0 refills | Status: DC | PRN

## 2022-07-03 MED ORDER — PROMETHAZINE HCL 25 MG PO TABS: 25.0000 mg | ORAL_TABLET | Freq: Four times a day (QID) | ORAL | 0 refills | Status: DC | PRN

## 2022-07-07 LAB — PANORAMA PRENATAL TEST FULL PANEL:PANORAMA TEST PLUS 5 ADDITIONAL MICRODELETIONS: FETAL FRACTION: 7

## 2022-07-07 LAB — CULTURE, OB URINE

## 2022-07-07 LAB — URINE CULTURE, OB REFLEX

## 2022-07-07 NOTE — BH Specialist Note (Signed)
Integrated Behavioral Health via Telemedicine Visit  07/15/2022 Courtney Holloway [id number]  Number of Integrated Behavioral Health Clinician visits: 1- Initial Visit  Session Start time: 1427   Session End time: 1506  Total time in minutes: 39   Referring Provider: Corlis Hove, NP Courtney/Family location: Home [facility] Provider location: Center for [facility] at [facility]  All persons participating in visit: Courtney Holloway and Courtney Holloway   Types of Service: Individual psychotherapy and Video visit  I connected with Courtney Holloway and/or Courtney Holloway's  n/a  via  Telephone or Video Enabled Telemedicine Application  (Video is Caregility application) and verified that I am speaking with the correct person using two identifiers. Discussed confidentiality: Yes   I discussed the limitations of telemedicine and the availability of in person appointments.  Discussed there is a possibility of technology failure and discussed alternative modes of communication if that failure occurs.  I discussed that engaging in this telemedicine visit, they consent to the provision of behavioral healthcare and the services will be billed under their insurance.  Courtney and/or legal guardian expressed understanding and consented to Telemedicine visit: Yes   Presenting Concerns: Courtney and/or family reports the following symptoms/concerns: Concern about paying rent, groceries, lack of transportation; lack of support during unexpected pregnancy; concern about managing full time work and school; will be attending [facility] soon.  Duration of problem: Current pregnancy; Severity of problem:  moderately severe  Courtney and/or Family's Strengths/Protective Factors: Sense of purpose  Goals Addressed: Courtney will:  Reduce symptoms of: anxiety, depression, and stress   Increase knowledge and/or ability of: healthy habits and stress reduction    Demonstrate ability to: Increase healthy adjustment to current life circumstances and Increase motivation to adhere to plan of care  Progress towards Goals: Ongoing  Interventions: Interventions utilized:  Solution-Focused Strategies, Psychoeducation and/or Health Education, and Link to [facility] Standardized Assessments completed: Not Needed  Courtney and/or Family Response: Courtney agrees with treatment plan.   Assessment: Courtney currently experiencing Adjustment disorder with mixed anxious and depressed mood and Psychosocial stress.   Courtney may benefit from psychoeducation and brief therapeutic interventions regarding coping with symptoms of depression, anxiety, life stress .  Plan: Follow up with behavioral health clinician on : Two weeks Behavioral recommendations:  -Continue taking prenatal vitamin daily as prescribed -Accept WIC referral; make sure voicemail is set up and not full -Contact Medicaid to set up transportation to and from medical appointments -Continue plan to attend upcoming job interview on Friday -Ask to shop [employer] after Centering group tomorrow -At [employer], ask about options for attending appointments if interferes with work schedule -Continue plan to call [facility] today to set up appointment -Continue additional community resources on After Visit Summary  Referral(s): [employer] Resources:  Arts administrator, English as a second language teacher, and [employer], etc.  I discussed the assessment and treatment plan with the Courtney and/or parent/guardian. They were provided an opportunity to ask questions and all were answered. They agreed with the plan and demonstrated an understanding of the instructions.   They were advised to call back or seek an in-person evaluation if the symptoms worsen or if the condition fails to improve as anticipated.  Rae Lips, LCSW     07/02/2022    9:06 AM 01/13/2022   11:38 AM 11/14/2021    1:01 PM 10/08/2021    11:17 AM 09/17/2021   11:52 AM  Depression screen PHQ 2/9  Decreased Interest 3 2 2  2 3  Down, Depressed, Hopeless 3 3 2 2 3   PHQ - 2 Score 6 5 4 4 6   Altered sleeping 3 3 2 2 3   Tired, decreased energy 3 3 2 2 3   Change in appetite 3 3 2 1 3   Feeling bad or failure about yourself  3 3 2 2 3   Trouble concentrating 0 0 2 2 0  Moving slowly or fidgety/restless 0 0 0 0 0  Suicidal thoughts 0      PHQ-9 Score 18 17 14 13 18       07/02/2022    9:06 AM 01/13/2022   11:38 AM 11/14/2021    1:01 PM 10/08/2021   11:17 AM  GAD 7 : Generalized Anxiety Score  Nervous, Anxious, on Edge 2 2 2 2   Control/stop worrying 2 2 2 2   Worry too much - different things 3 2 2 2   Trouble relaxing 2 2 2 2   Restless 0 0 2 0  Easily annoyed or irritable 3 3 2 3   Afraid - awful might happen 2 3 2 3   Total GAD 7 Score 14 14 14  14

## 2022-07-12 ENCOUNTER — Other Ambulatory Visit: Payer: Self-pay | Admitting: Student

## 2022-07-12 DIAGNOSIS — O99820 Streptococcus B carrier state complicating pregnancy: Secondary | ICD-10-CM

## 2022-07-12 MED ORDER — AMOXICILLIN 500 MG PO CAPS: 500.0000 mg | ORAL_CAPSULE | Freq: Three times a day (TID) | ORAL | 0 refills | Status: AC

## 2022-07-13 LAB — HORIZON CUSTOM: REPORT SUMMARY: NEGATIVE

## 2022-07-15 ENCOUNTER — Ambulatory Visit (INDEPENDENT_AMBULATORY_CARE_PROVIDER_SITE_OTHER): Payer: Medicaid Other | Admitting: Clinical

## 2022-07-15 DIAGNOSIS — Z658 Other specified problems related to psychosocial circumstances: Secondary | ICD-10-CM

## 2022-07-15 DIAGNOSIS — F4323 Adjustment disorder with mixed anxiety and depressed mood: Secondary | ICD-10-CM | POA: Diagnosis not present

## 2022-07-15 NOTE — Patient Instructions (Addendum)
Center for [facility] at [facility] for Women [address] [place], [place] [address] [phone] (main office) [phone] [facility] office)  www.[email or web address].com  [employer] www.womenscentergso.Danice Goltz (Low Income Risk manager) LowBlog.nl  [employer] (Low Income Automotive engineer) LittleDVDs.dk  Transportation Resources [place] [employer] (GTA) [address] [place] [place], [place], [place] [address] https://www.[place].gov/departments/transportation/gdot-divisions/[place]-transit-agency-public-transportation-division     Fixed-route bus services, including regional fare cards for [place], [place], [place], and [place] buses.  Reduced fare bus ID's available for Medicaid, Medicare, and "orange card" recipients.  SCAT offers curb-to-curb and door-to-door bus services for people with disabilities who are unable to use a fixed-bus route; also offers a shared-ride program.   Helpful tips:  -Routes available online and physical maps available at the main bus hub lobby (each for a specific route) -Smartphone directions often include bus routes (see the "bus" icon, next to the "car" and "walk" icons) -Routes differ on weekends, evenings and holidays, so plan ahead!  -If you have Medicaid, Medicare, or orange card, plan to obtain a reduced-fare ID to save 50% on rides. Check days and times to obtain an ID, and bring all necessary documents.   [employer] [address], [place], [place] [address] [phone] www.wheels4hope.org **REFERRAL NEEDED by specific agencies (see website), after meeting specified criteria only  Housing Resources                    [employer] (serves [place], [place], [place], [place], [place], [place], [place], [place], [place], [place], [place], [place], and [place] counties) [address] [place], [place] [address] [phone] DeveloperU.ch  **Rental assistance, Home Rehabilitation,Weatherization Assistance Program, Chief Financial Officer, Housing Voucher Program  [employer] for Housing and [employer] Studies: Proofreader Resources to residents of [place], [place], and [place] Make sure you have your documents ready, including:  (Household income verification: 2 months pay stubs, unemployment/social security award letter, statement of no income for all household members over 56) Photo ID for all household members over 18 Utility Bill/Rent Ledger/Lease: must show past due amount for utilities/rent, or the rental agreement if rent is current 2. Start your application online or by paper (in [place] or [place]) at:     https://www.castaneda.biz/  3. Once you have completed the online application, you will get an email confirmation message from the county. Expect to hear back by phone or email at least 6-10 weeks from submitting your application.  4. While you wait:  Call [phone] to check in on your application Let your landlord know that you've applied. Your landlord will be asked to submit documents (W-9) during this application process. Payments will be made directly to the landlord/property management company and utility company Rent or utility assistance for [employer], [place], and [place] Apply at https://rb.gy/dvxbfv Questions? Call or email Renee at [phone] or [email or web address] .[facility] [address] [place], [place] [address] [phone] www.gha-[place].[facility] [address] Tawny Hopping [place], [place] [address] [phone] PhoneCaptions.ch **Programs include: Hospital doctor and Housing Counseling, Healthy Radiographer, therapeutic, Homeless Prevention and Housing Assistance  [facility] [address], [place], [place] [address] [phone] www.PaintingEmporium.co.za **housing applications/recertification; tax payment relief/exemption under specific qualifications  [facility] [address] [place], [place] [address] www.onlinegreensboro.com/~maryshouse **transitional housing for women in recovery who have minor children  or are pregnant  [facility] [address] [place], [place], [place] [address] https://johnson-smith.net/  **emergency shelter and support services for families facing homelessness  [employer] [address] [place], [place] [address] [phone] www.youthfocus.org **transitional housing to pregnant women; emergency housing for youth who have run away, are experiencing a family crisis, are victims of abuse or neglect, or are homeless  [facility] [address] [place], [place] [address] [phone] www.greensborourbanministry.org  **emergency and transitional housing, rent/mortgage assistance, utility assistance  [employer] [address], [place], [place] [address] [phone] PaidValue.com.cy **home buyer education courses, foreclosure prevention  [facility] [address], [place], [place] [address] [phone] DefMagazine.is **Environmental Exposure Assessment (investigation of homes where either children or pregnant women with a confirmed elevated blood lead level reside)  [employer] [address] [place], [place] [address] [phone] https://www.self-help.org/locations/[place]-branch **Offers credit-building and banking services to  people unable to use traditional banking

## 2022-07-16 ENCOUNTER — Telehealth: Payer: Self-pay | Admitting: *Deleted

## 2022-07-16 ENCOUNTER — Encounter: Payer: Self-pay | Admitting: Family Medicine

## 2022-07-16 ENCOUNTER — Encounter: Payer: Self-pay | Admitting: *Deleted

## 2022-07-16 DIAGNOSIS — O99210 Obesity complicating pregnancy, unspecified trimester: Secondary | ICD-10-CM | POA: Insufficient documentation

## 2022-07-16 NOTE — Progress Notes (Deleted)
PRENATAL VISIT NOTE  Subjective:  Courtney Holloway is a 21 y.o. G1P0 at [redacted]w[redacted]d being seen today for ongoing prenatal care.  She is currently monitored for the following issues for this {Blank single:19197::"high-risk","low-risk"} pregnancy and has GAD (generalized anxiety disorder); Asthma; Failed vision screen; Allergic rhinitis; Slow transit constipation; Hidradenitis axillaris; Dyslipidemia; Post-traumatic stress disorder; Menorrhagia with regular cycle; HSV-1 (herpes simplex virus 1) infection; MDD (major depressive disorder), recurrent severe, without psychosis (HCC); Attention deficit hyperactivity disorder (ADHD), combined type; Drug overdose, multiple drugs, undetermined intent, initial encounter; Anal fissure; Nightmares associated with chronic post-traumatic stress disorder; Food allergy; Prediabetes; Body mass index (BMI) of 39.0 to 39.9 in adult; Elevated blood pressure reading without diagnosis of hypertension; Enlarged tonsils; Supervision of other normal pregnancy, antepartum; and Maternal obesity, antepartum on their problem list.  Patient reports {sx:14538}.   .  .   . Denies leaking of fluid.   The following portions of the patient's history were reviewed and updated as appropriate: allergies, current medications, past family history, past medical history, past social history, past surgical history and problem list.   Objective:  There were no vitals filed for this visit.  Fetal Status:           General:  Alert, oriented and cooperative. Patient is in no acute distress.  Skin: Skin is warm and dry. No rash noted.   Cardiovascular: Normal heart rate noted  Respiratory: Normal respiratory effort, no problems with respiration noted  Abdomen: Soft, gravid, appropriate for gestational age.        Pelvic: {Blank single:19197::"Cervical exam performed in the presence of a chaperone","Cervical exam deferred"}        Extremities: Normal range of motion.     Mental Status: Normal  mood and affect. Normal behavior. Normal judgment and thought content.   Assessment and Plan:  Pregnancy: G1P0 at [redacted]w[redacted]d 1. Encounter for supervision of normal first pregnancy in second trimester   Centering Pregnancy, Session#1: Introduction to model of care. Group determined rules for self-governance and closing phrase. Oriented group to space and mother's notebook.   Facilitated discussion today:  common discomforts, When to call practice  Mindfulness activity completed as well as introduction to deep breathing for childbirth preparation- Centering 3 Breaths  Fundal height and FHR appropriate today unless noted otherwise in plan of care. Patient to continue group care.    2. Maternal obesity, antepartum ***  3. [redacted] weeks gestation of pregnancy ***  {Blank single:19197::"Term","Preterm"} labor symptoms and general obstetric precautions including but not limited to vaginal bleeding, contractions, leaking of fluid and fetal movement were reviewed in detail with the patient. Please refer to After Visit Summary for other counseling recommendations.   No follow-ups on file.  Future Appointments  Date Time Provider Department Center  07/16/2022  9:00 AM Federico Flake, MD [facility]  08/04/2022  3:45 PM [facility] HEALTH CLINICIAN [facility]  08/05/2022 10:00 AM Georganna Skeans, MD P[facility] None  08/13/2022  9:00 AM CENTERING PROVIDER [facility]  08/19/2022  9:30 AM [facility] NURSE [facility]  08/19/2022  9:45 AM [facility] US5 [facility]  09/10/2022  9:00 AM CENTERING PROVIDER [facility]  10/08/2022  9:00 AM CENTERING PROVIDER [facility]  10/22/2022  9:00 AM CENTERING PROVIDER [facility]  11/05/2022  9:00 AM CENTERING PROVIDER [facility]  11/19/2022  9:00 AM CENTERING PROVIDER [facility]  12/03/2022  9:00 AM CENTERING PROVIDER [facility]  12/17/2022  9:00 AM CENTERING PROVIDER [facility]  12/31/2022  9:00 AM CENTERING PROVIDER [facility]    Federico Flake, MD

## 2022-07-16 NOTE — Telephone Encounter (Signed)
Rhunette Croft CenteringPregnancy prenatal appointment today. Discussed with Dr. Alvester Morin and does not need to be rescheduled but call patient and review next appointment date/time.I called Gigi and left a message that she missed her appointment today and has another 08/13/22 9am and  to please attend. Also to call us if any questions. Will send mychart message also. Nancy Fetter

## 2022-07-22 NOTE — BH Specialist Note (Signed)
Integrated Behavioral Health via Telemedicine Visit  08/04/2022 Courtney Holloway  Number of Integrated Behavioral Health Clinician visits: 2- Second Visit  Session Start time: [phone]   Session End time: 1647  Total time in minutes: 40   Referring Provider: Corlis Hove, NP Patient/Family [facility] Provider location: Center for [facility] at [facility]  All persons participating in visit: Patient Courtney Holloway and Courtney Holloway   Types of Service: Individual psychotherapy and Video visit  I connected with Courtney Holloway and/or Courtney Holloway's  n/a  via  Telephone or Video Enabled Telemedicine Application  (Video is Caregility application) and verified that I am speaking with the correct person using two identifiers. Discussed confidentiality: Yes   I discussed the limitations of telemedicine and the availability of in person appointments.  Discussed there is a possibility of technology failure and discussed alternative modes of communication if that failure occurs.  I discussed that engaging in this telemedicine visit, they consent to the provision of behavioral healthcare and the services will be billed under their insurance.  Patient and/or legal guardian expressed understanding and consented to Telemedicine visit: Yes   Presenting Concerns: Patient and/or family reports the following symptoms/concerns: Feeling really sad, frustrated, worry about potential gestational diabetes, not happy with current classes, but needs to continue taking due to financial circumstances, hasn't heard back from therapist after seeing once at [facility] with no upcoming appointments; father of baby has been more supportive. Duration of problem: Current pregnancy ; Severity of problem:  moderately severe  Patient and/or Family's Strengths/Protective Factors: Social connections and Sense of purpose  Goals Addressed: Patient will:  Reduce  symptoms of: anxiety, depression, and stress   Demonstrate ability to: Increase healthy adjustment to current life circumstances  Progress towards Goals: Ongoing  Interventions: Interventions utilized:  Supportive Reflection Standardized Assessments completed: Not Needed  Patient and/or Family Response: Patient agrees with treatment plan.   Assessment: Patient currently experiencing Adjustment disorder with mixed anxious and depressed mood and Psychosocial stress .   Patient may benefit from continued therapeutic interventions .  Plan: Follow up with behavioral health clinician on : Two weeks Behavioral recommendations:  --Continue prioritizing healthy self-care (regular meals, adequate rest; allowing practical help from supportive friends and family) -Continue plan to attend upcoming appointments including glucose test  Referral(s): Integrated [employer] (In Clinic)  I discussed the assessment and treatment plan with the patient and/or parent/guardian. They were provided an opportunity to ask questions and all were answered. They agreed with the plan and demonstrated an understanding of the instructions.   They were advised to call back or seek an in-person evaluation if the symptoms worsen or if the condition fails to improve as anticipated.  Rae Lips, LCSW     08/04/2022    2:16 PM 07/02/2022    9:06 AM 01/13/2022   11:38 AM 11/14/2021    1:01 PM 10/08/2021   11:17 AM  Depression screen PHQ 2/9  Decreased Interest 1 3 2 2 2   Down, Depressed, Hopeless 1 3 3 2 2   PHQ - 2 Score 2 6 5 4 4   Altered sleeping 2 3 3 2 2   Tired, decreased energy 2 3 3 2 2   Change in appetite 2 3 3 2 1   Feeling bad or failure about yourself  2 3 3 2 2   Trouble concentrating 0 0 0 2 2  Moving slowly or fidgety/restless 0 0 0 0 0  Suicidal thoughts 0 0     PHQ-9 Score 10 18 17 14 13       08/04/2022    2:16 PM 07/02/2022    9:06 AM 01/13/2022   11:38 AM 11/14/2021     1:01 PM  GAD 7 : Generalized Anxiety Score  Nervous, Anxious, on Edge 2 2 2 2   Control/stop worrying 2 2 2 2   Worry too much - different things 2 3 2 2   Trouble relaxing 2 2 2 2   Restless 0 0 0 2  Easily annoyed or irritable 2 3 3 2   Afraid - awful might happen 2 2 3 2   Total GAD 7 Score 12 14 14  14

## 2022-08-04 ENCOUNTER — Ambulatory Visit (INDEPENDENT_AMBULATORY_CARE_PROVIDER_SITE_OTHER): Payer: Medicaid Other | Admitting: Clinical

## 2022-08-04 ENCOUNTER — Other Ambulatory Visit: Payer: Self-pay

## 2022-08-04 ENCOUNTER — Ambulatory Visit (INDEPENDENT_AMBULATORY_CARE_PROVIDER_SITE_OTHER): Payer: Medicaid Other | Admitting: Advanced Practice Midwife

## 2022-08-04 VITALS — BP 124/78 | HR 113 | Wt 235.3 lb

## 2022-08-04 DIAGNOSIS — O26892 Other specified pregnancy related conditions, second trimester: Secondary | ICD-10-CM

## 2022-08-04 DIAGNOSIS — R7303 Prediabetes: Secondary | ICD-10-CM

## 2022-08-04 DIAGNOSIS — Z3A17 17 weeks gestation of pregnancy: Secondary | ICD-10-CM

## 2022-08-04 DIAGNOSIS — F4323 Adjustment disorder with mixed anxiety and depressed mood: Secondary | ICD-10-CM | POA: Diagnosis not present

## 2022-08-04 DIAGNOSIS — R519 Headache, unspecified: Secondary | ICD-10-CM

## 2022-08-04 DIAGNOSIS — Z658 Other specified problems related to psychosocial circumstances: Secondary | ICD-10-CM

## 2022-08-04 DIAGNOSIS — Z6841 Body Mass Index (BMI) 40.0 and over, adult: Secondary | ICD-10-CM

## 2022-08-04 DIAGNOSIS — Z3402 Encounter for supervision of normal first pregnancy, second trimester: Secondary | ICD-10-CM

## 2022-08-04 MED ORDER — CYCLOBENZAPRINE HCL 10 MG PO TABS: 10.0000 mg | ORAL_TABLET | Freq: Three times a day (TID) | ORAL | 1 refills | Status: DC | PRN

## 2022-08-04 NOTE — Patient Instructions (Signed)
Center [facility] at [facility] [address] [place], [place] [address] [phone] (main office) [phone] (Careli Luzader's office)   

## 2022-08-05 ENCOUNTER — Ambulatory Visit: Payer: Medicaid Other | Admitting: Family

## 2022-08-05 ENCOUNTER — Ambulatory Visit: Payer: Medicaid Other | Admitting: Family Medicine

## 2022-08-05 NOTE — Progress Notes (Signed)
PRENATAL VISIT NOTE  Subjective:  Courtney Holloway is a 21 y.o. G1P0 at [redacted]w[redacted]d being seen today for ongoing prenatal care.  She is currently monitored for the following issues for this low-risk pregnancy and has GAD (generalized anxiety disorder); Asthma; Failed vision screen; Allergic rhinitis; Slow transit constipation; Hidradenitis axillaris; Dyslipidemia; Post-traumatic stress disorder; Menorrhagia with regular cycle; HSV-1 (herpes simplex virus 1) infection; MDD (major depressive disorder), recurrent severe, without psychosis (HCC); Attention deficit hyperactivity disorder (ADHD), combined type; Drug overdose, multiple drugs, undetermined intent, initial encounter; Anal fissure; Nightmares associated with chronic post-traumatic stress disorder; Food allergy; Prediabetes; Body mass index (BMI) of 39.0 to 39.9 in adult; Elevated blood pressure reading without diagnosis of hypertension; Enlarged tonsils; Supervision of other normal pregnancy, antepartum; and Maternal obesity, antepartum on their problem list.  Patient reports  ongoing difficulty with recurrent headaches . Tylenol is no longer helping. She is able to fall asleep and sleep through the night but wakes up again with a headache. Endorses headache 7 days per week.   Contractions: Not present. Vag. Bleeding: None.  Movement: Absent. Denies leaking of fluid.   Patient states she has nausea when she skips meals but is also struggling with a recurrence of food aversions. Typical diet includes apple slices, fruit cups, pasta, flavored water.   The following portions of the patient's history were reviewed and updated as appropriate: allergies, current medications, past family history, past medical history, past social history, past surgical history and problem list. Problem list updated.  Objective:   Vitals:   08/04/22 1402  BP: 124/78  Pulse: (!) 113  Weight: 235 lb 4.8 oz (106.7 kg)    Fetal Status: Fetal Heart Rate (bpm): 141    Movement: Absent     General:  Alert, oriented and cooperative. Patient is in no acute distress.  Skin: Skin is warm and dry. No rash noted.   Cardiovascular: Normal heart rate noted  Respiratory: Normal respiratory effort, no problems with respiration noted  Abdomen: Soft, gravid, appropriate for gestational age.  Pain/Pressure: Present     Pelvic: Cervical exam deferred        Extremities: Normal range of motion.  Edema: None  Mental Status: Normal mood and affect. Normal behavior. Normal judgment and thought content.   Assessment and Plan:  Pregnancy: G1P0 at [redacted]w[redacted]d  1. Supervision of normal first teen pregnancy in second trimester - Routine care - Ongoing abdominal pain consistent with round ligament pain  2. Prediabetes --Prediabetic per elevated HgbA1C of 5.7 on 07/02/2022 --Advised to please schedule early 2 hour GTT ASAP to assess for Gestational Diabetes  3. [redacted] weeks gestation of pregnancy - AFP, Serum, Open Spina Bifida  4. Adjustment disorder with mixed anxiety and depressed mood - Ambulatory referral to Integrated Behavioral Health  5. Headache in pregnancy, antepartum, second trimester - [facility] to [facility] at [facility] - Encouraged virtual appointment for detailed assessment and more aggressive management - cyclobenzaprine (FLEXERIL) 10 MG tablet; Take 1 tablet (10 mg total) by mouth every 8 (eight) hours as needed for muscle spasms.  Dispense: 30 tablet; Refill: 1  6. Class 3 severe obesity with body mass index (BMI) of 40.0 to 44.9 in adult, unspecified obesity type, unspecified whether serious comorbidity present (HCC)   Preterm labor symptoms and general obstetric precautions including but not limited to vaginal bleeding, contractions, leaking of fluid and fetal movement were reviewed in detail with the patient. Please refer to After Visit Summary for other counseling recommendations.  Return for Please schedule for Fasting 2 hour GTT ASAP.  Future  Appointments  Date Time Provider Department Center  08/08/2022  8:50 AM [facility] LAB [facility]  08/19/2022  9:30 AM W[facility]-M[facility] NURSE [facility]  08/19/2022  9:45 AM [facility]MFC US5 [facility]  08/19/2022  1:15 PM WMC-BEHAVIORAL HEALTH CLINICIAN [facility]  09/01/2022  4:15 PM Magnus Sinning, Dimas [place], PA-C [facility]    Calvert Cantor, CNM

## 2022-08-05 NOTE — BH Specialist Note (Signed)
Will call to reschedule.

## 2022-08-06 ENCOUNTER — Encounter: Payer: Self-pay | Admitting: Advanced Practice Midwife

## 2022-08-07 ENCOUNTER — Other Ambulatory Visit: Payer: Self-pay

## 2022-08-07 DIAGNOSIS — Z3480 Encounter for supervision of other normal pregnancy, unspecified trimester: Secondary | ICD-10-CM

## 2022-08-08 ENCOUNTER — Other Ambulatory Visit: Payer: Medicaid Other

## 2022-08-08 DIAGNOSIS — Z3480 Encounter for supervision of other normal pregnancy, unspecified trimester: Secondary | ICD-10-CM

## 2022-08-08 LAB — AFP, SERUM, OPEN SPINA BIFIDA
AFP MoM: 1.1
AFP Value: 37 ng/mL
Gest. Age on Collection Date: 17.1 weeks
Maternal Age At EDD: 19.3 yr
OSBR Risk 1 IN: 10000
Test Results:: NEGATIVE
Weight: 235 [lb_av]

## 2022-08-09 ENCOUNTER — Encounter: Payer: Self-pay | Admitting: Advanced Practice Midwife

## 2022-08-09 LAB — CBC
Hematocrit: 34.1 % (ref 34.0–46.6)
Hemoglobin: 11.2 g/dL (ref 11.1–15.9)
MCH: 27.4 pg (ref 26.6–33.0)
MCHC: 32.8 g/dL (ref 31.5–35.7)
MCV: 83 fL (ref 79–97)
Platelets: 401 10*3/uL (ref 150–450)
RBC: 4.09 x10E6/uL (ref 3.77–5.28)
RDW: 15.3 % (ref 11.7–15.4)
WBC: 11.5 10*3/uL — ABNORMAL HIGH (ref 3.4–10.8)

## 2022-08-09 LAB — HIV ANTIBODY (ROUTINE TESTING W REFLEX): HIV Screen 4th Generation wRfx: NONREACTIVE

## 2022-08-09 LAB — RPR: RPR Ser Ql: NONREACTIVE

## 2022-08-11 ENCOUNTER — Telehealth: Payer: Self-pay | Admitting: *Deleted

## 2022-08-11 DIAGNOSIS — Z3480 Encounter for supervision of other normal pregnancy, unspecified trimester: Secondary | ICD-10-CM

## 2022-08-11 NOTE — Telephone Encounter (Signed)
Called pt and discussed need for GTT. Since she was unable to tolerate the 2hr GTT w/glucola and also the jelly beans, Dr. Alysia Penna has approved for her to have the 1hr GTT. I explained that she should eat breakfast on Kaylon Hitz of test and also she should take phenergan prior to her appt. She voiced understanding and agreed to appt on 9/1 @ 0930.

## 2022-08-15 ENCOUNTER — Other Ambulatory Visit: Payer: Self-pay

## 2022-08-19 ENCOUNTER — Other Ambulatory Visit: Payer: Self-pay | Admitting: *Deleted

## 2022-08-19 ENCOUNTER — Ambulatory Visit: Payer: Medicaid Other | Admitting: *Deleted

## 2022-08-19 ENCOUNTER — Encounter: Payer: Self-pay | Admitting: *Deleted

## 2022-08-19 ENCOUNTER — Ambulatory Visit: Payer: Medicaid Other | Admitting: Clinical

## 2022-08-19 ENCOUNTER — Other Ambulatory Visit: Payer: Medicaid Other

## 2022-08-19 ENCOUNTER — Ambulatory Visit: Payer: Medicaid Other | Attending: Student

## 2022-08-19 VITALS — BP 109/70 | HR 111

## 2022-08-19 DIAGNOSIS — Z3480 Encounter for supervision of other normal pregnancy, unspecified trimester: Secondary | ICD-10-CM | POA: Diagnosis not present

## 2022-08-19 DIAGNOSIS — O99512 Diseases of the respiratory system complicating pregnancy, second trimester: Secondary | ICD-10-CM | POA: Insufficient documentation

## 2022-08-19 DIAGNOSIS — J45909 Unspecified asthma, uncomplicated: Secondary | ICD-10-CM | POA: Insufficient documentation

## 2022-08-19 DIAGNOSIS — O99212 Obesity complicating pregnancy, second trimester: Secondary | ICD-10-CM | POA: Diagnosis not present

## 2022-08-19 DIAGNOSIS — Z363 Encounter for antenatal screening for malformations: Secondary | ICD-10-CM | POA: Insufficient documentation

## 2022-08-19 DIAGNOSIS — Z3A19 19 weeks gestation of pregnancy: Secondary | ICD-10-CM | POA: Diagnosis not present

## 2022-08-19 DIAGNOSIS — O09892 Supervision of other high risk pregnancies, second trimester: Secondary | ICD-10-CM

## 2022-08-19 DIAGNOSIS — R7303 Prediabetes: Secondary | ICD-10-CM

## 2022-08-19 DIAGNOSIS — O99210 Obesity complicating pregnancy, unspecified trimester: Secondary | ICD-10-CM

## 2022-08-19 DIAGNOSIS — Z362 Encounter for other antenatal screening follow-up: Secondary | ICD-10-CM

## 2022-08-20 ENCOUNTER — Telehealth: Payer: Self-pay | Admitting: General Practice

## 2022-08-20 LAB — GLUCOSE TOLERANCE, 1 HOUR: Glucose, 1Hr PP: 194 mg/dL (ref 70–199)

## 2022-08-20 NOTE — Telephone Encounter (Signed)
Patient called into front office stating she saw her glucose results in mychart and is worried about having diabetes and the risks to the baby. Discussed with patient I didn't have a clear answer on her results right now and the provider hasn't reviewed her results yet. Told patient it is possible they will go ahead and say she has gestational diabetes but I couldn't say for certain. Discussed appt with diabetes educator and what insulin management could look like. Reassured patient our goal is for her to be healthy and safe and for baby to be healthy and safe. Discussed we will make recommendations on what we feel should happen in order to get to that point. Reassured patient to just take it one day at a time at this point and see what the doctor says about her results and go from there. Patient verbalized understanding & had no other questions at this time

## 2022-08-22 ENCOUNTER — Telehealth: Payer: Self-pay | Admitting: Clinical

## 2022-08-22 NOTE — Telephone Encounter (Signed)
Attempt to reschedule, as requested; Left HIPPA-compliant message to call back Asher Muir from [employer] for [employer] at [facility] for Women at  [phone] [facility] office).

## 2022-09-01 ENCOUNTER — Encounter: Payer: Self-pay | Admitting: Medical

## 2022-09-04 IMAGING — US US PELVIS COMPLETE
1 series · 13 of 25 positions shown · non-contrast
Comparison: None.

CLINICAL DATA: Vaginal bleeding abdominal pain.

EXAM:
TRANSABDOMINAL AND TRANSVAGINAL ULTRASOUND OF PELVIS
DOPPLER ULTRASOUND OF OVARIES
TECHNIQUE: Both transabdominal and transvaginal ultrasound examinations of the
pelvis were performed. Transabdominal technique was performed for
global imaging of the pelvis including uterus, ovaries, adnexal
regions, and pelvic cul-de-sac.
It was necessary to proceed with endovaginal exam following the
transabdominal exam to visualize the uterus, ovaries and adnexa.
Color and duplex Doppler ultrasound was utilized to evaluate blood
flow to the ovaries.

[Series 1: us pelvis (transabdominal only) · 13 of 60 slices shown]
[im 1/60]
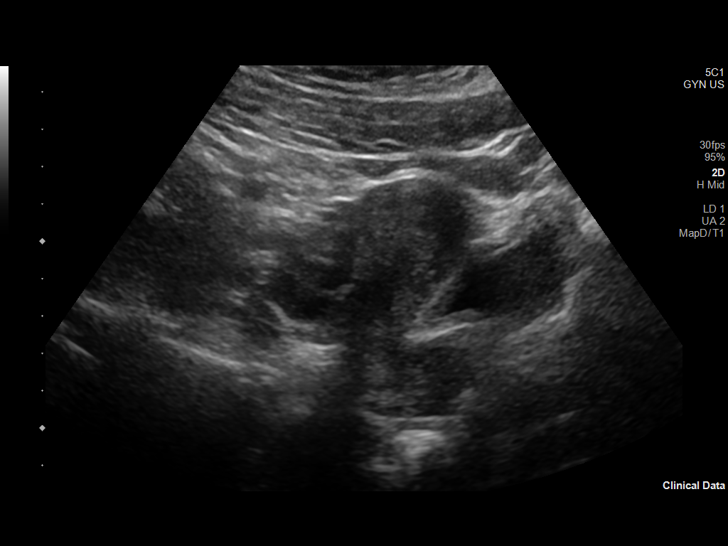
[im 5/60]
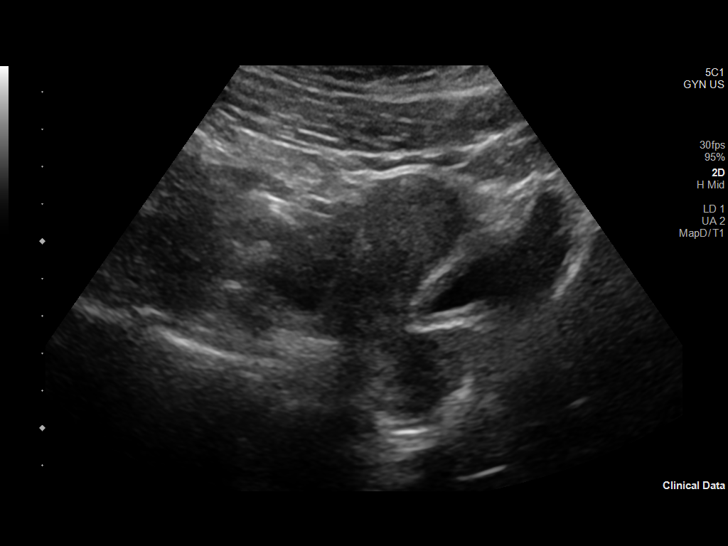
[im 10/60]
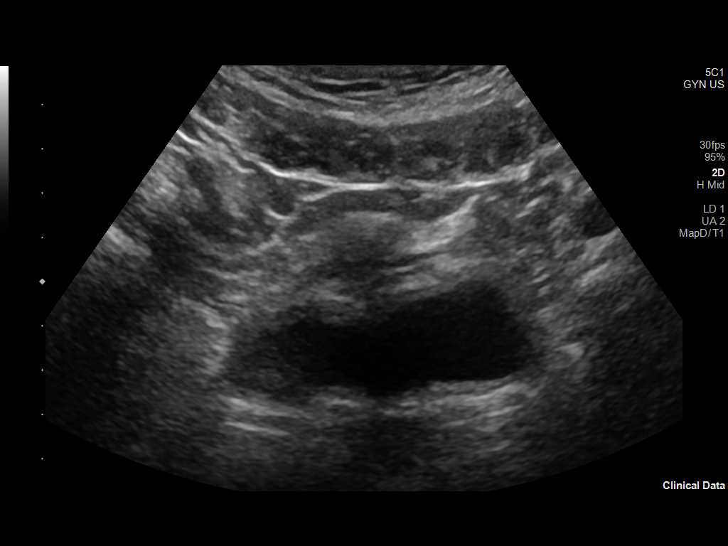
[im 15/60]
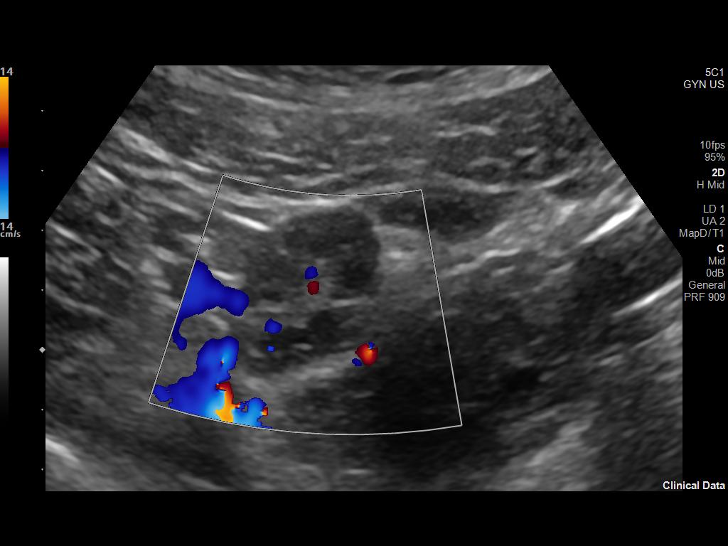
[im 20/60]
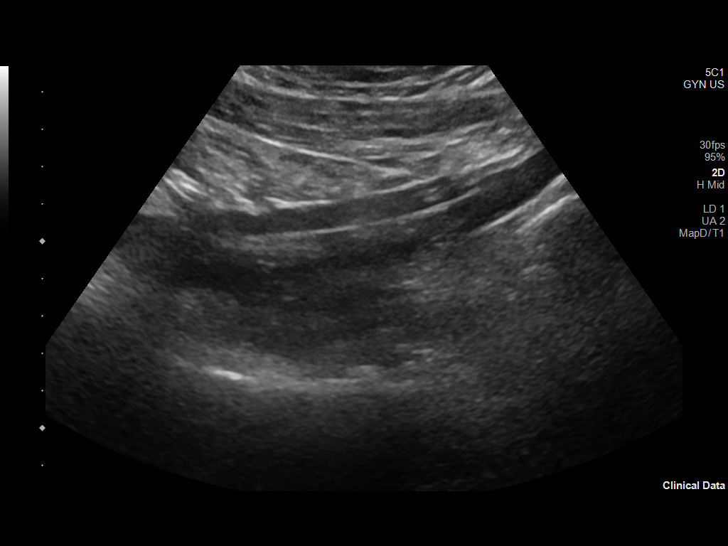
[im 25/60]
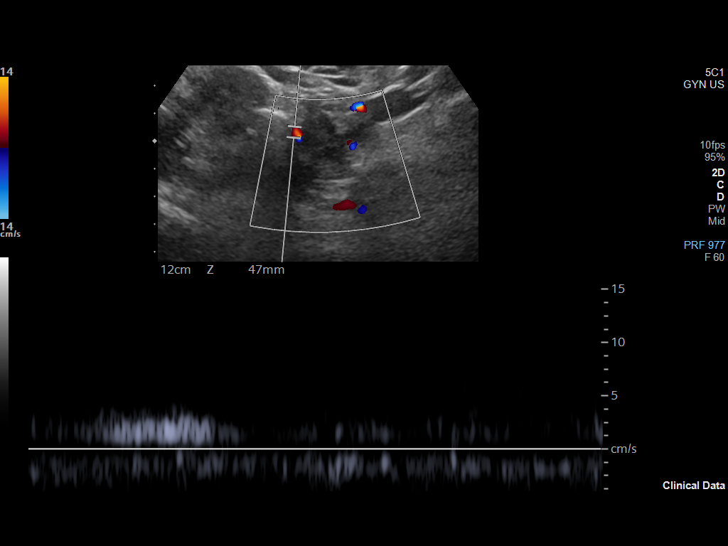
[im 30/60]
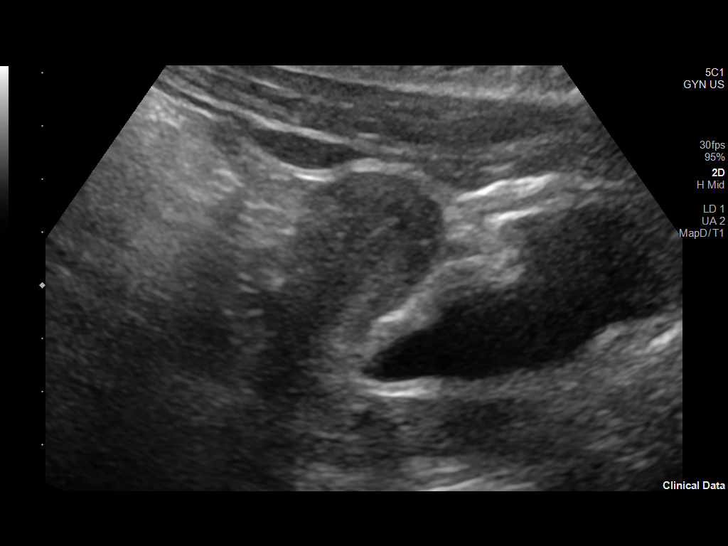
[im 35/60]
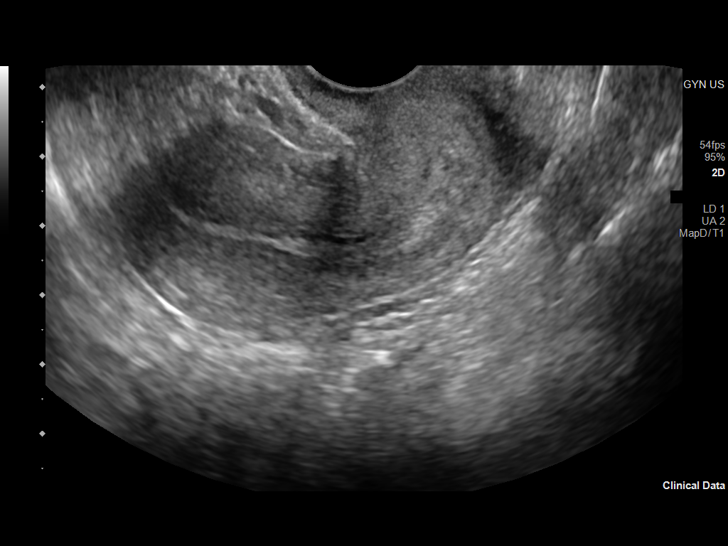
[im 40/60]
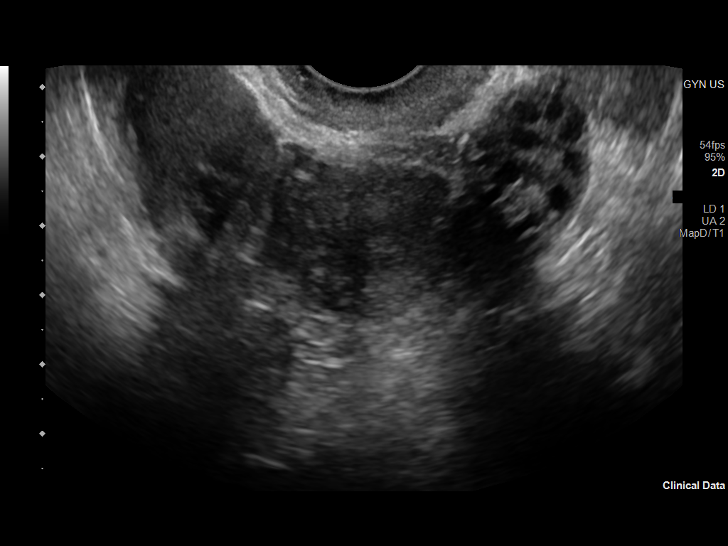
[im 45/60]
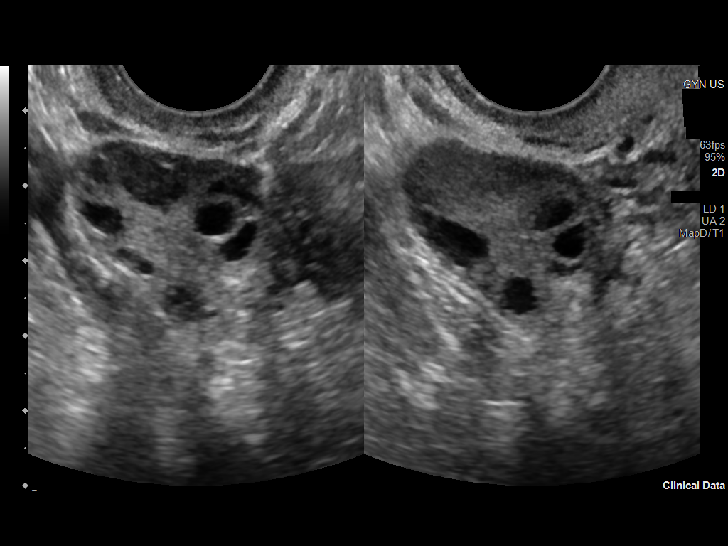
[im 50/60]
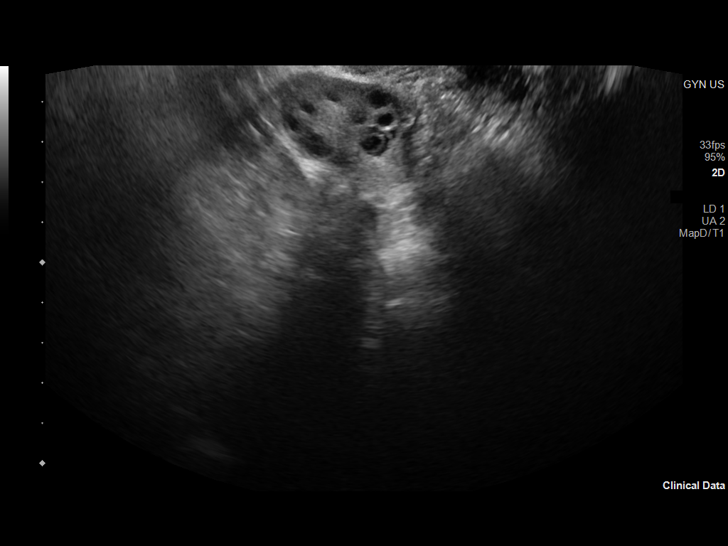
[im 55/60]
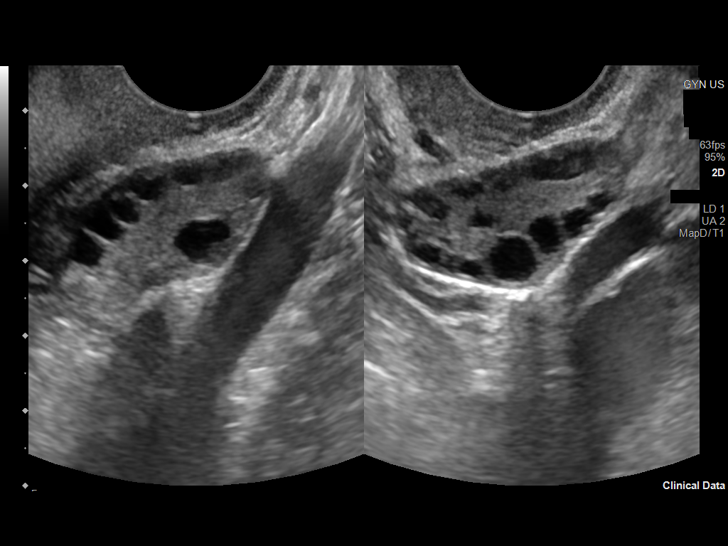
[im 60/60]
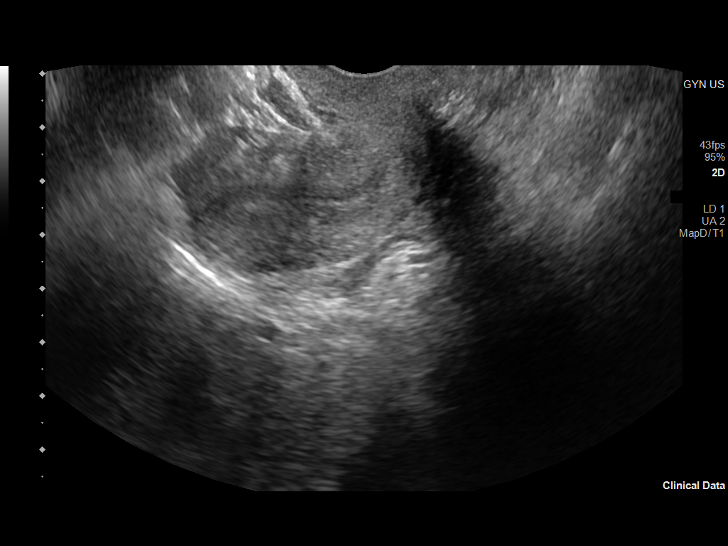

[13 of 25 positions shown; findings below may reference images not displayed]

FINDINGS: Uterus

Measurements: 6.5 x 3.7 x 2.8 cm = volume: 35 mL. No fibroids or
other mass visualized.

Endometrium

Thickness: 7 mm.  No focal abnormality visualized.

Right ovary

Measurements: 3.4 x 2.8 x 2.3 cm = volume: 11 mL. Normal
appearance/no adnexal mass.

Left ovary

Measurements: 3.2 x 2.9 x 2.4 cm = volume: 11 mL. Normal
appearance/no adnexal mass.

Pulsed Doppler evaluation of both ovaries demonstrates normal
low-resistance arterial and venous waveforms.

Other findings

No abnormal free fluid.
IMPRESSION: No acute abnormality. No evidence for ovarian torsion.

## 2022-09-15 ENCOUNTER — Encounter: Payer: Self-pay | Admitting: Obstetrics & Gynecology

## 2022-09-15 ENCOUNTER — Telehealth (INDEPENDENT_AMBULATORY_CARE_PROVIDER_SITE_OTHER): Payer: Medicaid Other | Admitting: Obstetrics & Gynecology

## 2022-09-15 DIAGNOSIS — O24912 Unspecified diabetes mellitus in pregnancy, second trimester: Secondary | ICD-10-CM

## 2022-09-15 DIAGNOSIS — O24913 Unspecified diabetes mellitus in pregnancy, third trimester: Secondary | ICD-10-CM | POA: Insufficient documentation

## 2022-09-15 DIAGNOSIS — Z3A23 23 weeks gestation of pregnancy: Secondary | ICD-10-CM

## 2022-09-15 DIAGNOSIS — O0992 Supervision of high risk pregnancy, unspecified, second trimester: Secondary | ICD-10-CM

## 2022-09-15 HISTORY — DX: Unspecified diabetes mellitus in pregnancy, second trimester: O24.912

## 2022-09-15 MED ORDER — ACCU-CHEK SOFTCLIX LANCETS MISC: 12 refills | Status: DC

## 2022-09-15 MED ORDER — ACCU-CHEK GUIDE VI STRP: ORAL_STRIP | 12 refills | Status: DC

## 2022-09-15 MED ORDER — ACCU-CHEK GUIDE W/DEVICE KIT: 1.0000 | PACK | Freq: Four times a day (QID) | 0 refills | Status: DC

## 2022-09-15 NOTE — Progress Notes (Signed)
OBSTETRICS PRENATAL VIRTUAL VISIT ENCOUNTER NOTE  Provider location: [facility] [address] [facility]   Patient location: Home  I connected with Carleene Overlie on 09/15/22 at  4:15 PM EDT by MyChart Video Encounter and verified that I am speaking with the correct person using two identifiers. I discussed the limitations, risks, security and privacy concerns of performing an evaluation and management service virtually and the availability of in person appointments. I also discussed with the patient that there may be a patient responsible charge related to this service. The patient expressed understanding and agreed to proceed. Subjective:  Courtney Holloway is a 21 y.o. G1P0 at [redacted]w[redacted]d being seen today for ongoing prenatal care.  She is currently monitored for the following issues for this high-risk pregnancy and has GAD (generalized anxiety disorder); Asthma; Failed vision screen; Allergic rhinitis; Slow transit constipation; Hidradenitis axillaris; Dyslipidemia; Post-traumatic stress disorder; Menorrhagia with regular cycle; HSV-1 (herpes simplex virus 1) infection; MDD (major depressive disorder), recurrent severe, without psychosis ([place]); Attention deficit hyperactivity disorder (ADHD), combined type; Drug overdose, multiple drugs, undetermined intent, initial encounter; Anal fissure; Nightmares associated with chronic post-traumatic stress disorder; Food allergy; Prediabetes; Body mass index (BMI) of 39.0 to 39.9 in adult; Elevated blood pressure reading without diagnosis of hypertension; Enlarged tonsils; Supervision of high-risk pregnancy; Maternal morbid obesity, antepartum ([place]); and Diabetes mellitus during pregnancy in second trimester on their problem list.  Patient reports no complaints.  Contractions: Not present. Vag. Bleeding: None.  Movement: Present. Denies any leaking of fluid.   The following portions of the patient's history were reviewed and updated as  appropriate: allergies, current medications, past family history, past medical history, past social history, past surgical history and problem list.   Objective:  There were no vitals filed for this visit.  Fetal Status:     Movement: Present     General:  Alert, oriented and cooperative. Patient is in no acute distress.  Respiratory: Normal respiratory effort, no problems with respiration noted  Mental Status: Normal mood and affect. Normal behavior. Normal judgment and thought content.  Rest of physical exam deferred due to type of encounter  Imaging: [place] MFM OB DETAIL +14 WK  Result Date: 08/19/2022 ----------------------------------------------------------------------  OBSTETRICS REPORT                       (Signed Final 08/19/2022 11:20 am) ---------------------------------------------------------------------- Patient Info  ID #:       [id number]                          D.O.B.:  05-Jul-2001 (21 yrs)  Name:       Courtney Holloway [facility]                Visit Date: 08/19/2022 10:45 am ---------------------------------------------------------------------- Performed By  Attending:        Valeda Malm DO       Ref. Address:     [address]                                                             [place], [place]  [address]  Performed By:     Cyndie Mull          [facility]  Referred By:      [facility] ---------------------------------------------------------------------- Orders  #  Description                           Code        Ordered By  1  [place] MFM OB DETAIL +14 WK               76811.01    Corlis Hove ----------------------------------------------------------------------  #   Order #                     Accession #                Episode #  1  [id number]                   [id number]                 [id number] ---------------------------------------------------------------------- Indications  Obesity complicating pregnancy, second         O99.212  trimester (BMI 41.59)  Encounter for antenatal screening for          Z36.3  malformations  Medical complication of pregnancy (pre-        O26.90  diabetes)  Teen pregnancy                                 O75.89  [redacted] weeks gestation of pregnancy                Z3A.19  Asthma                                         O99.89 j45.909  LR NIPS/Neg [place]/Neg AFP ---------------------------------------------------------------------- Vital Signs  BP:          109/70 ---------------------------------------------------------------------- Fetal Evaluation  Num Of Fetuses:         1  Fetal Heart Rate(bpm):  158  Cardiac Activity:       Observed  Presentation:           Cephalic  Placenta:               Posterior  P. Cord Insertion:      Visualized, central  Amniotic Fluid  AFI FV:      Within normal limits                              Largest Pocket(cm)                              3.22 ---------------------------------------------------------------------- Biometry  BPD:     45.37  mm     G. Age:  19w 5d         69  %    CI:        76.45   %    70 - 86                                                          FL/HC:      18.1   %    16.1 - 18.3  HC:    164.41   mm     G. Age:  19w 1d         37  %    HC/AC:      1.23        1.09 - 1.39  AC:    133.73   mm     G. Age:  18w 6d         31  %    FL/BPD:     65.5   %  FL:       29.7  mm     G. Age:  19w 1d         38  %    FL/AC:      22.2   %    20 - 24  CER:      19.9  mm     G. Age:  19w 2d         47  %  NFT:       2.5  mm  LV:        7.4  mm  CM:        6.4  mm  Est. FW:     271  gm    0 lb 10 oz      32  % ---------------------------------------------------------------------- OB History  Gravidity:    1  ---------------------------------------------------------------------- Gestational Age  LMP:           20w 0d        Date:  04/01/22                  EDD:   01/06/23  U/S Today:     19w 2d                                        EDD:   01/11/23  Best:  19w 2d     Det. ByLoman Chroman         EDD:   01/11/23                                      (05/26/22) ---------------------------------------------------------------------- Anatomy  Cranium:               Appears normal         LVOT:                   Appears normal  Cavum:                 Appears normal         Aortic Arch:            Not well visualized  Ventricles:            Appears normal         Ductal Arch:            Not well visualized  Choroid Plexus:        Appears normal         Diaphragm:              Appears normal  Cerebellum:            Appears normal         Stomach:                Appears normal, left                                                                        sided  Posterior Fossa:       Appears normal         Abdomen:                Appears normal  Nuchal Fold:           Appears normal         Abdominal Wall:         Appears nml (cord                                                                        insert, abd wall)  Face:                  Orbits nl; profile not Cord Vessels:           Not well visualized                         well visualized  Lips:                  Appears normal         Kidneys:                Appear  normal  Palate:                Not well visualized    Bladder:                Appears normal  Thoracic:              Appears normal         Spine:                  Ltd views no                                                                        intracranial signs of                                                                        NTD  Heart:                 Not well visualized    Upper Extremities:      Appears normal  RVOT:                  Appears normal         Lower Extremities:       Appears normal  Other:  Fetus appears to be a female. Lenses visualized. VC, 3VV and 3VTV          visualized. Technically difficult due to maternal habitus and fetal          position. ---------------------------------------------------------------------- Cervix Uterus Adnexa  Cervix  Length:            3.2  cm.  Closed Normal appearance by transabdominal scan.  Adnexa  No abnormality visualized. ---------------------------------------------------------------------- Comments  Ms. Sharpnack is a G1 at 19w 2d with EDD of 01/11/2023  dated by Early Ultrasound  (05/26/22) here for a detailed  anatomic survey for obesity, teen pregnancy and  prediabetes. She had low risk NIPS and normal AFP. GTT  was collected today but she had difficulty tolerating it and had  an episode of emesis so the results may be spurious if she  passes.  Sonographic findings  Single intrauterine pregnancy.  Observed fetal cardiac activity.  Cephalic presentation.  Fetal anatomy that was well seen appears normal without  evidence of soft markers. Not all fetal structures were well  seen due to a technically diffcult exam and the anatomic  survey remains incomplete with limited views of the arches, 3  vessel cord, spine and profile.  Fetal biometry shows the estimated fetal weight at the 32  percentile.  Amniotic fluid volume: Within normal limits.  Placenta: Posterior.  Cervix: Closed Normal appearance by transabdominal scan  with a cervical length of 3.2 cm.  Adnexa: No abnormality visualized.  Recommendations  1. Serial growth ultrasounds every 4 weeks until delivery  2. Antenatal testing to start around 32 weeks (either weekly  BPP or twice weekly NST with weekly AFI)  3. Consider checking  blood sugars for 1-2 weeks given her  inability to complete her GTT.  I discussed the limitations of prenatal ultrasound with the  patient including inability to detect certain abnormalities and  poor visualization of certain anatomic structures due to fetal   position, gestational age and maternal body habitus.  The  patient verbalized understanding and had time to ask  questions that were answered to her satisfaction. ----------------------------------------------------------------------                 Valeda Malm, DO Electronically Signed Final Report   08/19/2022 11:20 am ----------------------------------------------------------------------   Assessment and Plan:  Pregnancy: G1P0 at [redacted]w[redacted]d 1. Diabetes mellitus during pregnancy in second trimester, unspecified diabetes mellitus type 2. [redacted] weeks gestation of pregnancy 3. Supervision of high risk pregnancy in second trimester Discussed diagnosis of diabetes in pregnancy given 1 hr GTT 194 at 19 weeks. Discussed implications of DM in pregnancy, need for optimizing glycemic control to decrease DM associated maternal-fetal morbidity and mortality, need for antenatal testing and frequent ultrasounds/prenatal visits. Ordered DM education and DM testing supplies today. - Amb Referral to Nutrition and Diabetic Education - Blood Glucose Monitoring Suppl (ACCU-CHEK GUIDE) w/Device KIT; 1 Device by Does not apply route 4 (four) times daily.  Dispense: 1 kit; Refill: 0 - glucose blood (ACCU-CHEK GUIDE) test strip; Use to check blood sugars four times a day was instructed  Dispense: 50 each; Refill: 12 - Accu-Chek Softclix Lancets lancets; Use as instructed  Dispense: 100 each; Refill: 12 - US Fetal Echocardiography; Future  Preterm labor symptoms and general obstetric precautions including but not limited to vaginal bleeding, contractions, leaking of fluid and fetal movement were reviewed in detail with the patient. I discussed the assessment and treatment plan with the patient. The patient was provided an opportunity to ask questions and all were answered. The patient agreed with the plan and demonstrated an understanding of the instructions. The patient was advised to call back or seek an in-person office  evaluation/go to MAU at [facility] for any urgent or concerning symptoms. Please refer to After Visit Summary for other counseling recommendations.   I provided 20 minutes of face-to-face time during this encounter.  Return in about 2 weeks (around 09/29/2022) for OFFICE OB VISIT (MD only).  Future Appointments  Date Time Provider [place]  09/17/2022 11:15 AM [facility] NURSE [facility]  09/17/2022 11:30 AM [facility] US3 [facility] [place]    Verita Schneiders, MD [facility] for [employer], [place]

## 2022-09-17 ENCOUNTER — Other Ambulatory Visit: Payer: Self-pay | Admitting: *Deleted

## 2022-09-17 ENCOUNTER — Ambulatory Visit: Payer: Medicaid Other | Admitting: *Deleted

## 2022-09-17 ENCOUNTER — Ambulatory Visit: Payer: Medicaid Other | Attending: Maternal & Fetal Medicine

## 2022-09-17 VITALS — BP 120/78 | HR 117

## 2022-09-17 DIAGNOSIS — Z362 Encounter for other antenatal screening follow-up: Secondary | ICD-10-CM | POA: Diagnosis present

## 2022-09-17 DIAGNOSIS — O0992 Supervision of high risk pregnancy, unspecified, second trimester: Secondary | ICD-10-CM | POA: Diagnosis present

## 2022-09-17 DIAGNOSIS — O09892 Supervision of other high risk pregnancies, second trimester: Secondary | ICD-10-CM

## 2022-09-17 DIAGNOSIS — R7303 Prediabetes: Secondary | ICD-10-CM | POA: Diagnosis present

## 2022-09-17 DIAGNOSIS — O99212 Obesity complicating pregnancy, second trimester: Secondary | ICD-10-CM

## 2022-09-17 DIAGNOSIS — O99210 Obesity complicating pregnancy, unspecified trimester: Secondary | ICD-10-CM | POA: Insufficient documentation

## 2022-09-25 ENCOUNTER — Ambulatory Visit (INDEPENDENT_AMBULATORY_CARE_PROVIDER_SITE_OTHER): Payer: Medicaid Other | Admitting: Registered"

## 2022-09-25 ENCOUNTER — Encounter: Payer: Medicaid Other | Attending: Obstetrics & Gynecology | Admitting: Registered"

## 2022-09-25 DIAGNOSIS — O0992 Supervision of high risk pregnancy, unspecified, second trimester: Secondary | ICD-10-CM | POA: Diagnosis not present

## 2022-09-25 DIAGNOSIS — O24912 Unspecified diabetes mellitus in pregnancy, second trimester: Secondary | ICD-10-CM

## 2022-09-25 DIAGNOSIS — O24112 Pre-existing diabetes mellitus, type 2, in pregnancy, second trimester: Secondary | ICD-10-CM | POA: Insufficient documentation

## 2022-09-25 DIAGNOSIS — Z3A24 24 weeks gestation of pregnancy: Secondary | ICD-10-CM

## 2022-09-25 DIAGNOSIS — Z713 Dietary counseling and surveillance: Secondary | ICD-10-CM | POA: Diagnosis not present

## 2022-09-25 NOTE — Progress Notes (Signed)
Virtual Visit via Video Note  I connected with Courtney Holloway on 09/25/22 at 10:15 AM EDT by a video enabled telemedicine application and verified that I am speaking with the correct person using two identifiers.  Location: Patient: home Provider: [facility],    I discussed the limitations of evaluation and management by telemedicine and the availability of in person appointments. The patient expressed understanding and agreed to proceed.  Patient was seen for Diabetes during pregnancy self-management  Start time 1015 and End time 1115   Estimated due date: 01/11/2023; [redacted]w[redacted]d  History of Present Illness: Pre-DM x/2 yrs, pt reports used insulin x2 months at first and was followed by MD every 2 weeks until BG brought down. Pt states she lost a lot of weight, changed diet including more fruits and vegetables to avoid developing diabetes. Pt states she has h/o of depression and is feeling depressed about gaining back the weight she lost.  Pt reports family h/o T2D.   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 and recently had an assignment to complete a detailed MyPlate analysis her 3-day food intake.  Pt states her blood sugar has been pretty good since she started checking, only high a few times, 1x due to orange juice.  Pt may not be getting enough calcium/vit D, states milk makes her throw up. Pt reports no issue with milk prior to pregnancy. Pt states sometimes drinks almond milk.  Encouraged patient to try [place] yogurt for both getting more calcium and protein in diet.  Pt states she doesn't always eat 3x/day and does not eat snacks. Pt states she sleeps through lunchtime.   Pt states at [facility] tested her hemoglobin and she was told she has low iron. Pt reports sxs including pica, ice  cravings, always cold, sleepiness.  SMBG x/5 days, since Oct 8. FBS always 90 or less PPBG:  82-132-117  Pt reports 180 after juice, normally drinks diet tea  24 hr dietary assessment:  Physical Activity: plans to start walking daiy Stress: not assessed Sleep: not assessed  24 hr Recall:  B: eggs, sausage 93 mg/dL S: L: none S: sliced apples or fruit cups D: Ranch chicken wrap S: Beverages:   NUTRITION INTERVENTION  Nutrition education (E-1) on the following topics:   Initial Follow-up  []  []  Definition of Gestational Diabetes []  []  Why dietary management is important in controlling blood glucose [x]  []  Effects each nutrient has on blood glucose levels [x]  []  Simple carbohydrates vs complex carbohydrates []  []  Fluid intake [x]  []  Creating a balanced meal plan [x]  []  Carbohydrate counting  [x]  []  When to check blood glucose levels [x]  []  Proper blood glucose monitoring techniques []  []  Effect of stress and stress reduction techniques  [x]  []  Exercise effect on blood glucose levels, appropriate exercise during pregnancy [x]  []  Importance of limiting caffeine and abstaining from alcohol and smoking []  []  Medications used for blood sugar control during pregnancy []  []  Hypoglycemia and rule of 15 [x]  []  Postpartum self care  Patient has a meter prior to visit.   Patient instructed to monitor glucose levels: FBS: 60 - ? 95 mg/dL  1 hour: ? mg/dL 2 hour: ? mg/dL  Patient received handouts: (reviewed via MyChart, will send email) Nutrition Diabetes and Pregnancy Carbohydrate Counting List  Patient will be  seen for follow-up as needed.     I discussed the assessment and treatment plan with the patient. The patient was provided an opportunity to ask questions and all were answered. The patient agreed with the plan and demonstrated an understanding of the instructions.   The patient was advised to call back or seek an in-person evaluation if the symptoms worsen  or if the condition fails to improve as anticipated.  I provided 60 minutes of non-face-to-face time during this encounter.   Levie Heritage, RD, LDN, CDCES

## 2022-10-02 ENCOUNTER — Encounter: Payer: Self-pay | Admitting: Obstetrics & Gynecology

## 2022-10-02 DIAGNOSIS — O0992 Supervision of high risk pregnancy, unspecified, second trimester: Secondary | ICD-10-CM

## 2022-10-03 MED ORDER — BLOOD PRESSURE KIT DEVI: 1.0000 | Freq: Once | 0 refills | Status: AC

## 2022-10-06 ENCOUNTER — Other Ambulatory Visit: Payer: Self-pay

## 2022-10-06 ENCOUNTER — Encounter: Payer: Self-pay | Admitting: Obstetrics and Gynecology

## 2022-10-06 ENCOUNTER — Ambulatory Visit (INDEPENDENT_AMBULATORY_CARE_PROVIDER_SITE_OTHER): Payer: Medicaid Other | Admitting: Obstetrics and Gynecology

## 2022-10-06 VITALS — BP 125/85 | HR 135 | Wt 244.3 lb

## 2022-10-06 DIAGNOSIS — O0992 Supervision of high risk pregnancy, unspecified, second trimester: Secondary | ICD-10-CM

## 2022-10-06 DIAGNOSIS — Z3A26 26 weeks gestation of pregnancy: Secondary | ICD-10-CM

## 2022-10-06 DIAGNOSIS — R8271 Bacteriuria: Secondary | ICD-10-CM | POA: Insufficient documentation

## 2022-10-06 DIAGNOSIS — O99212 Obesity complicating pregnancy, second trimester: Secondary | ICD-10-CM

## 2022-10-06 DIAGNOSIS — Z23 Encounter for immunization: Secondary | ICD-10-CM

## 2022-10-06 DIAGNOSIS — O24112 Pre-existing diabetes mellitus, type 2, in pregnancy, second trimester: Secondary | ICD-10-CM

## 2022-10-06 MED ORDER — PREPLUS 27-1 MG PO TABS: 1.0000 | ORAL_TABLET | Freq: Every day | ORAL | 13 refills | Status: DC

## 2022-10-06 NOTE — Patient Instructions (Signed)

## 2022-10-06 NOTE — Progress Notes (Signed)
Subjective:  Courtney Holloway is a 21 y.o. G1P0 at [redacted]w[redacted]d being seen today for ongoing prenatal care.  She is currently monitored for the following issues for this high-risk pregnancy and has GAD (generalized anxiety disorder); Asthma; Failed vision screen; Allergic rhinitis; Slow transit constipation; Hidradenitis axillaris; Dyslipidemia; Post-traumatic stress disorder; MDD (major depressive disorder), recurrent severe, without psychosis ([place]); Attention deficit hyperactivity disorder (ADHD), combined type; Drug overdose, multiple drugs, undetermined intent, initial encounter; Anal fissure; Nightmares associated with chronic post-traumatic stress disorder; Food allergy; Prediabetes; Body mass index (BMI) of 39.0 to 39.9 in adult; Elevated blood pressure reading without diagnosis of hypertension; Enlarged tonsils; Supervision of high-risk pregnancy; Maternal morbid obesity, antepartum ([place]); Diabetes mellitus during pregnancy in second trimester; and GBS bacteriuria on their problem list.  Patient reports general discomforts of pregnancy.  Contractions: Not present. Vag. Bleeding: None.  Movement: Present. Denies leaking of fluid.   The following portions of the patient's history were reviewed and updated as appropriate: allergies, current medications, past family history, past medical history, past social history, past surgical history and problem list. Problem list updated.  Objective:   Vitals:   10/06/22 1041  BP: 125/85  Pulse: (!) 135  Weight: 244 lb 4.8 oz (110.8 kg)    Fetal Status: Fetal Heart Rate (bpm): 145   Movement: Present     General:  Alert, oriented and cooperative. Patient is in no acute distress.  Skin: Skin is warm and dry. No rash noted.   Cardiovascular: Normal heart rate noted  Respiratory: Normal respiratory effort, no problems with respiration noted  Abdomen: Soft, gravid, appropriate for gestational age. Pain/Pressure: Present     Pelvic:  Cervical exam deferred         Extremities: Normal range of motion.  Edema: None  Mental Status: Normal mood and affect. Normal behavior. Normal judgment and thought content.   Urinalysis:      Assessment and Plan:  Pregnancy: G1P0 at [redacted]w[redacted]d  1. Supervision of high risk pregnancy in second trimester Stable - CBC - HIV antibody (with reflex) - RPR  2. Pre-existing type 2 diabetes mellitus during pregnancy in second trimester CBG's not in goal range Pt reports following diet but does not eat meals at regular times Can not tolerate Metformin Has used insulin in the past Discussed importance of glycemic control for risk reduction of pregnancy and DM Will refer to [facility] for Dexacom, per pt request Serial growth scans and antenatal testing as per protocol.  3. Maternal morbid obesity, antepartum ([place]) Serial growth scans as per MFM  4. GBS bacteriuria Tx while in labor  5. Need for immunization against influenza  - Flu Vaccine QUAD 67mo+IM (Fluarix, Fluzone & Alfiuria Quad PF)  Preterm labor symptoms and general obstetric precautions including but not limited to vaginal bleeding, contractions, leaking of fluid and fetal movement were reviewed in detail with the patient. Please refer to After Visit Summary for other counseling recommendations.  No follow-ups on file.   Chancy Milroy, MD

## 2022-10-06 NOTE — Addendum Note (Signed)
Addended by: Donell Beers T on: 10/06/2022 11:42 AM   Modules accepted: Orders

## 2022-10-07 LAB — CBC
Hematocrit: 32.4 % — ABNORMAL LOW (ref 34.0–46.6)
Hemoglobin: 10.8 g/dL — ABNORMAL LOW (ref 11.1–15.9)
MCH: 26.8 pg (ref 26.6–33.0)
MCHC: 33.3 g/dL (ref 31.5–35.7)
MCV: 80 fL (ref 79–97)
Platelets: 446 10*3/uL (ref 150–450)
RBC: 4.03 x10E6/uL (ref 3.77–5.28)
RDW: 13.6 % (ref 11.7–15.4)
WBC: 10.1 10*3/uL (ref 3.4–10.8)

## 2022-10-07 LAB — RPR: RPR Ser Ql: NONREACTIVE

## 2022-10-07 LAB — HIV ANTIBODY (ROUTINE TESTING W REFLEX): HIV Screen 4th Generation wRfx: NONREACTIVE

## 2022-10-09 ENCOUNTER — Encounter: Payer: Self-pay | Admitting: Advanced Practice Midwife

## 2022-10-09 DIAGNOSIS — O219 Vomiting of pregnancy, unspecified: Secondary | ICD-10-CM

## 2022-10-10 MED ORDER — PROMETHAZINE HCL 25 MG PO TABS: 25.0000 mg | ORAL_TABLET | Freq: Four times a day (QID) | ORAL | 0 refills | Status: DC | PRN

## 2022-10-15 ENCOUNTER — Encounter (HOSPITAL_COMMUNITY): Payer: Self-pay | Admitting: Obstetrics & Gynecology

## 2022-10-15 ENCOUNTER — Inpatient Hospital Stay (HOSPITAL_COMMUNITY)
Admission: AD | Admit: 2022-10-15 | Discharge: 2022-10-15 | Disposition: A | Discharge status: Home / Self Care | Payer: Medicaid Other | Attending: Obstetrics & Gynecology | Admitting: Obstetrics & Gynecology

## 2022-10-15 ENCOUNTER — Ambulatory Visit (INDEPENDENT_AMBULATORY_CARE_PROVIDER_SITE_OTHER): Payer: Medicaid Other | Admitting: General Practice

## 2022-10-15 VITALS — BP 111/63 | HR 131

## 2022-10-15 DIAGNOSIS — M549 Dorsalgia, unspecified: Secondary | ICD-10-CM

## 2022-10-15 DIAGNOSIS — O99891 Other specified diseases and conditions complicating pregnancy: Secondary | ICD-10-CM | POA: Diagnosis not present

## 2022-10-15 DIAGNOSIS — O26892 Other specified pregnancy related conditions, second trimester: Secondary | ICD-10-CM | POA: Diagnosis not present

## 2022-10-15 DIAGNOSIS — B999 Unspecified infectious disease: Secondary | ICD-10-CM | POA: Insufficient documentation

## 2022-10-15 DIAGNOSIS — O368130 Decreased fetal movements, third trimester, not applicable or unspecified: Secondary | ICD-10-CM | POA: Diagnosis not present

## 2022-10-15 DIAGNOSIS — M545 Low back pain, unspecified: Secondary | ICD-10-CM | POA: Diagnosis not present

## 2022-10-15 DIAGNOSIS — Z3A27 27 weeks gestation of pregnancy: Secondary | ICD-10-CM | POA: Insufficient documentation

## 2022-10-15 DIAGNOSIS — K5900 Constipation, unspecified: Secondary | ICD-10-CM | POA: Diagnosis not present

## 2022-10-15 DIAGNOSIS — O23592 Infection of other part of genital tract in pregnancy, second trimester: Secondary | ICD-10-CM | POA: Diagnosis not present

## 2022-10-15 DIAGNOSIS — H539 Unspecified visual disturbance: Secondary | ICD-10-CM | POA: Insufficient documentation

## 2022-10-15 DIAGNOSIS — B3731 Acute candidiasis of vulva and vagina: Secondary | ICD-10-CM | POA: Diagnosis not present

## 2022-10-15 DIAGNOSIS — O98812 Other maternal infectious and parasitic diseases complicating pregnancy, second trimester: Secondary | ICD-10-CM | POA: Diagnosis not present

## 2022-10-15 DIAGNOSIS — O99612 Diseases of the digestive system complicating pregnancy, second trimester: Secondary | ICD-10-CM | POA: Diagnosis not present

## 2022-10-15 DIAGNOSIS — R519 Headache, unspecified: Secondary | ICD-10-CM

## 2022-10-15 LAB — URINALYSIS, ROUTINE W REFLEX MICROSCOPIC
Bilirubin Urine: NEGATIVE
Glucose, UA: NEGATIVE mg/dL
Hgb urine dipstick: NEGATIVE
Ketones, ur: 5 mg/dL — AB
Nitrite: NEGATIVE
Protein, ur: 30 mg/dL — AB
Specific Gravity, Urine: 1.029 (ref 1.005–1.030)
pH: 5 (ref 5.0–8.0)

## 2022-10-15 LAB — WET PREP, GENITAL
Clue Cells Wet Prep HPF POC: NONE SEEN
Sperm: NONE SEEN
Trich, Wet Prep: NONE SEEN
WBC, Wet Prep HPF POC: >=10 — AB (ref <10)

## 2022-10-15 MED ORDER — CYCLOBENZAPRINE HCL 10 MG PO TABS: 10.0000 mg | ORAL_TABLET | Freq: Three times a day (TID) | ORAL | 0 refills | Status: DC | PRN

## 2022-10-15 MED ORDER — TERCONAZOLE 0.4 % VA CREA: 1.0000 | TOPICAL_CREAM | Freq: Every day | VAGINAL | 0 refills | Status: DC

## 2022-10-15 MED ORDER — ACETAMINOPHEN 500 MG PO TABS
1000.0000 mg | ORAL_TABLET | Freq: Once | ORAL | Status: AC
  Administered 2022-10-15: 1000 mg via ORAL
  Filled 2022-10-15: qty 2

## 2022-10-15 MED ORDER — CYCLOBENZAPRINE HCL 5 MG PO TABS
10.0000 mg | ORAL_TABLET | Freq: Once | ORAL | Status: AC
  Administered 2022-10-15: 10 mg via ORAL
  Filled 2022-10-15: qty 2

## 2022-10-15 NOTE — MAU Note (Signed)
Courtney Holloway is a 21 y.o. at [redacted]w[redacted]d here in MAU reporting: has had a HA constantly, started 3 days ago, is so bad, she is dizzy and seeing spots.  Feeling pressure in lower belly.  Sent over from DR, she is worried, says it is not normal.  Low back hurts really bad. BP has been up- 140's, in office was ok.  Pain isn't stopping, HA isn't stopping, even with Tylenol. Last taken at 0800 (2 ES), has  a migraine right now, head hurts so bad.  Denies epigastric pain, no increase in swelling. Has been constipated,  had BM today, is going, but it just hurts.  Hasn't felt the baby move as much. Was told migraines are normal   Onset of complaint: 3/4days ago Pain score: abd pain 8, back 8, HA 9 Vitals:   10/15/22 1255  BP: 136/81  Pulse: (!) 126  Resp: 20  Temp: 98.2 F (36.8 C)  SpO2: 100%     FHT:152 Lab orders placed from triage:  urine  Is concerned about weight gain. Was told to walk, that it would help with back pain, it's not helping.

## 2022-10-15 NOTE — Progress Notes (Signed)
Patient presents to office today reporting decreased fetal movement, pelvic/vaginal pain near constant currently rated at an 8, increased vaginal discharge, headaches, & elevated BP readings at home for the past 2 days. Sees spots when she has a headache. Tried tylenol without relief. NST reactive. Patient reports starting to feel some fetal movement. Discussed headaches and pain with Dr Dione Plover who recommends patient go to MAU for further evaluation. Discussed with patient. MAU called & notified.   Koren Bound RN BSN 10/15/22

## 2022-10-15 NOTE — MAU Provider Note (Cosign Needed Addendum)
History    CSN: [id number]  Arrival date and time: 10/15/22 1234   Event Date/Time   First Provider Initiated Contact with Patient 10/15/22 1406      Chief Complaint  Patient presents with   Abdominal Pain   Back Pain   Headache   Constipation   HPI Courtney Holloway is a 21 y.o. G1P0 [redacted]w[redacted]d who presents with primary complaints of headache and low back pain. She states that she has been having headaches throughout pregnancy but has a worsened "migraine" headache the last 3 days that she rates an 8/10. She said she also has visual disturbances associated with the headache. She states that tylenol has not improved symptoms. She states she has had elevated systolic blood pressures at home in the low 140s. Denies increased lower extremity edema. She states back pain and has been ongoing during her pregnancy but has worsened the last 2 days. She states it is an 8/10 and tylenol has not alleviated the pain. Denies abdominal trauma, vaginal bleeding.  She also states that she has had some thick, clear vaginal discharge but denies itching, fever, foul smelling discharge.   OB History     Gravida  1   Para      Term      Preterm      AB      Living         SAB      IAB      Ectopic      Multiple      Live Births              Past Medical History:  Diagnosis Date   Anxiety    Asthma    Depression    Diabetes mellitus during pregnancy in second trimester 09/15/2022   DMDD (disruptive mood dysregulation disorder) ([place]) 12/28/2015   Failed vision screen 11/21/2014   GAD (generalized anxiety disorder)    Headache(784.0)    HSV-1 (herpes simplex virus 1) infection 08/02/2020   Obesity 11/21/2014   Vision abnormalities    wears glasses    Past Surgical History:  Procedure Laterality Date   NO PAST SURGERIES      Family History  Problem Relation Age of Onset   Diabetes Mother    Bipolar disorder Mother    Schizophrenia Mother    Multiple sclerosis Mother     Diabetes Father    Bipolar disorder Maternal Grandmother    Alcohol abuse Maternal Grandmother     Social History   Tobacco Use   Smoking status: Former    Types: E-cigarettes    Quit date: 04/13/2022    Years since quitting: 0.5   Smokeless tobacco: Never  Vaping Use   Vaping Use: Former  Substance Use Topics   Alcohol use: Not Currently   Drug use: Not Currently    Types: Marijuana    Comment: 2021    Allergies:  Allergies  Allergen Reactions   Other Anaphylaxis and Itching    Seasonal allergies, dog fur, certain detergents  Pt. Reports mother has used "epi-pen" for past allergies.    Pineapple Anaphylaxis   Fish-Derived Products Diarrhea, Hives and Itching   Zofran [Ondansetron Hcl] Nausea And Vomiting    No medications prior to admission.    Review of Systems  Constitutional:  Negative for fever.  Cardiovascular:  Negative for leg swelling.  Gastrointestinal:  Positive for abdominal pain and constipation. Negative for diarrhea, nausea and vomiting.  Genitourinary:  Positive for frequency and  vaginal discharge. Negative for difficulty urinating, dysuria and vaginal bleeding.  Musculoskeletal:  Positive for back pain.  Neurological:  Positive for headaches.   Physical Exam   Blood pressure 100/67, pulse (!) 131, temperature 97.7 F (36.5 C), temperature source Oral, resp. rate 20, height 5\' 1"  (1.549 m), weight 113.1 kg, last menstrual period 04/01/2022, SpO2 98 %.  Physical Exam Exam conducted with a chaperone present.  Constitutional:      Appearance: She is well-developed. She is obese.  HENT:     Head: Normocephalic and atraumatic.  Cardiovascular:     Rate and Rhythm: Normal rate and regular rhythm.     Heart sounds: Normal heart sounds.  Pulmonary:     Effort: Pulmonary effort is normal.     Breath sounds: Normal breath sounds.  Abdominal:     General: Abdomen is flat. Bowel sounds are normal.     Palpations: Abdomen is soft.     Tenderness: There  is no abdominal tenderness.  Genitourinary:    Vagina: Vaginal discharge (thick white) present.     Comments: SVE: 0/thick/-3 Exam done by 04/03/2022  Musculoskeletal:        General: Tenderness (Low back) present.  Skin:    General: Skin is warm and dry.  Neurological:     General: No focal deficit present.     Mental Status: She is alert and oriented to person, place, and time. Mental status is at baseline.  Psychiatric:        Mood and Affect: Mood normal.        Behavior: Behavior normal.        Thought Content: Thought content normal.        Judgment: Judgment normal.    MAU Course  Procedures  Orders Placed This Encounter  Procedures   Culture, OB Urine    Standing Status:   Standing    Number of Occurrences:   1    Order Specific Question:   Patient immune status    Answer:   Normal   Wet prep, genital    Standing Status:   Standing    Number of Occurrences:   1    Order Specific Question:   Patient immune status    Answer:   Normal   Urinalysis, Routine w reflex microscopic Urine, Clean Catch    Standing Status:   Standing    Number of Occurrences:   1   Discharge patient    Order Specific Question:   Discharge disposition    Answer:   01-Home or Self Care [1]    Order Specific Question:   Discharge patient date    Answer:   10/15/2022   Results for orders placed or performed during the hospital encounter of 10/15/22 (from the past 24 hour(s))  Urinalysis, Routine w reflex microscopic Urine, Clean Catch     Status: Abnormal   Collection Time: 10/15/22  1:05 PM  Result Value Ref Range   Color, Urine YELLOW YELLOW   APPearance HAZY (A) CLEAR   Specific Gravity, Urine 1.029 1.005 - 1.030   pH 5.0 5.0 - 8.0   Glucose, UA NEGATIVE NEGATIVE mg/dL   Hgb urine dipstick NEGATIVE NEGATIVE   Bilirubin Urine NEGATIVE NEGATIVE   Ketones, ur 5 (A) NEGATIVE mg/dL   Protein, ur 30 (A) NEGATIVE mg/dL   Nitrite NEGATIVE NEGATIVE   Leukocytes,Ua LARGE (A) NEGATIVE    RBC / HPF 6-10 0 - 5 RBC/hpf   WBC, UA 11-20 0 -  5 WBC/hpf   Bacteria, UA RARE (A) NONE SEEN   Squamous Epithelial / LPF 0-5 0 - 5   Mucus PRESENT   Wet prep, genital     Status: Abnormal   Collection Time: 10/15/22  2:10 PM  Result Value Ref Range   Yeast Wet Prep HPF POC PRESENT (A) NONE SEEN   Trich, Wet Prep NONE SEEN NONE SEEN   Clue Cells Wet Prep HPF POC NONE SEEN NONE SEEN   WBC, Wet Prep HPF POC >=10 (A) <10   Sperm NONE SEEN      Meds ordered this encounter  Medications   acetaminophen (TYLENOL) tablet 1,000 mg   cyclobenzaprine (FLEXERIL) tablet 10 mg   terconazole (TERAZOL 7) 0.4 % vaginal cream    Sig: Place 1 applicator vaginally at bedtime. Use for seven days    Dispense:  45 g    Refill:  0    Order Specific Question:   Supervising Provider    Answer:   Alysia Penna, MICHAEL L [[id number]]   cyclobenzaprine (FLEXERIL) 10 MG tablet    Sig: Take 1 tablet (10 mg total) by mouth 3 (three) times daily as needed for muscle spasms (or headache).    Dispense:  30 tablet    Refill:  0    Order Specific Question:   Supervising Provider    Answer:   Alysia Penna, MICHAEL L [[id number]]   MDM Headache -Pt has had recurrent headaches during and prior to pregnancy. FH of migraine. Her headache was completely resolved with flexeril/tylenol.  -blood pressures have been normal in the office and here today; only elevated BP have been at home; low suspicion for pre-eclampsia Low Back Pain/Abdominal Pain -Back pain was not changed after tylenol and flexeril. Pain likely due to normal pregnancy discomfort. Pt denied vaginal bleeding, abdominal trauma. Cervical exam revealed a closed, thick cervix. Normal BP in office and here in MAU; low suspicion for abruption; low suspicion for preterm labor -pt to take tylenol and flexeril at home. Pt also to use maternity belt  Vaginal discharge -Wet prep was positive for yeast; pt stated she has had yeast infections in the past. She did not use medication in the past.  She washed the area more frequently and she stated that it went away on its own. She is open to using medication to treat it today.   Assessment and Plan  G1P0; [redacted]w[redacted]d Pt is stable for discharge Headache  -felxeril 10 mg TID PRN and tylenol PRN 2.   Low back pain/abdominal pain  -flexoril/tylenol PRN  -Pt to get maternity belt  3.   Fungal vaginitis  -Terazol 7 cream every night for 7 days  Justice Deeds PA-S 10/15/2022, 4:27 PM    Attestation of Supervision of Student:  I confirm that I have verified the information documented in the physician assistant student's note and that I have also personally performed the history, physical exam and all medical decision making activities.  I have verified that all services and findings are accurately documented in this student's note; and I agree with management and plan as outlined in the documentation. I have also made any necessary editorial changes.  History Courtney Holloway is a 21 y.o. G1P0 at [redacted]w[redacted]d who presents with headache, back pain, pelvic pain, and vaginal discharge.  Reports headache that started 3 days ago. Has history of headaches but this is worse. States pain is all over her head. Nothing makes better or worse. Took 2 ES tylenol this morning without  relief. Had some visual disturbance yesterday that resoled. Reports elevated BP at home yesterday.  Reports constant low back pain. Improved with lying down. Denies flank pain, fever, dysuria, or hematuria.  Pelvic pain started yesterday. Describes as intermittent pain but can't tell how frequently it occurs.  Reports good fetal movement.    Physical exam Patient Vitals for the past 24 hrs:  BP Temp Temp src Pulse Resp SpO2 Height Weight  10/15/22 1641 100/67 97.7 F (36.5 C) Oral (!) 131 -- -- -- --  10/15/22 1410 109/79 -- -- (!) 117 -- 98 % -- --  10/15/22 1345 138/70 -- -- (!) 117 -- 98 % -- --  10/15/22 1330 -- -- -- -- -- 96 % -- --  10/15/22 1315 132/67 -- -- (!) 128 -- 98 %  -- --  10/15/22 1255 136/81 98.2 F (36.8 C) Oral (!) 126 20 100 % 5\' 1"  (1.549 m) 113.1 kg    Physical Examination: General appearance - alert, well appearing, and in no distress Mental status - normal mood, behavior, speech, dress, motor activity, and thought processes Eyes - pupils equal and reactive, extraocular eye movements intact, sclera anicteric Chest - clear to auscultation, no wheezes, rales or rhonchi, symmetric air entry Pelvic - moderate amount of clumpy white discharge. Cervix closed/thick  NST:  Baseline: 145 bpm, Variability: Good {> 6 bpm), Accelerations: Non-reactive but appropriate for gestational age, and Decelerations: Absent   Assessment/Plan 1. Pregnancy headache in second trimester  -Headache resolved with meds given in MAU -Rx flexeril to take with tylenol for future headaches. Also given information regarding management of headaches at home  2. Vaginal yeast infection  -Rx terazol -GC/CT pending  3. Back pain affecting pregnancy in second trimester  -U/a with mod leuks. Patient denies urinary complaints, is afebrile, and has no CVA tenderness. Suspect pain is MSK in nature but will send urine for culture. Encouraged patient to start using maternity support belt.   4. [redacted] weeks gestation of pregnancy     , NP [facility] for [name], [facility] 10/15/2022 6:04 PM

## 2022-10-15 NOTE — Discharge Instructions (Signed)
For prevention of migraines in pregnancy: -Magnesium, 400mg  by mouth, once daily -Vitamin B2, 400mg  by mouth, once daily  For treatment of migraines in pregnancy: -take medication at the first sign of the pain of a headache, or the first sign of your aura -start with 1000mg  Tylenol (or excedrin tension headache with acetaminophen & caffeine only, NOT aspirin) with or without flexeril 10mg  -Do not take more than 4000 mg of tylenol (acetaminophen) per day      PREGNANCY SUPPORT BELT: You are not alone, Seventy-five percent of women have some sort of abdominal or back pain at some point in their pregnancy. Your baby is growing at a fast pace, which means that your whole body is rapidly trying to adjust to the changes. As your uterus grows, your back may start feeling a bit under stress and this can result in back or abdominal pain that can go from mild, and therefore bearable, to severe pains that will not allow you to sit or lay down comfortably, When it comes to dealing with pregnancy-related pains and cramps, some pregnant women usually prefer natural remedies, which the market is filled with nowadays. For example, wearing a pregnancy support belt can help ease and lessen your discomfort and pain. WHAT ARE THE BENEFITS OF WEARING A PREGNANCY SUPPORT BELT? A pregnancy support belt provides support to the lower portion of the belly taking some of the weight of the growing uterus and distributing to the other parts of your body. It is designed make you comfortable and gives you extra support. Over the years, the pregnancy apparel market has been studying the needs and wants of pregnant women and they have come up with the most comfortable pregnancy support belts that woman could ever ask for. In fact, you will no longer have to wear a stretched-out or bulky pregnancy belt that is visible underneath your clothes and makes you feel even more uncomfortable. Nowadays, a pregnancy support belt is made of  comfortable and stretchy materials that will not irritate your skin but will actually make you feel at ease and you will not even notice you are wearing it. They are easy to put on and adjust during the day and can be worn at night for additional support.  BENEFITS: Relives Back pain Relieves Abdominal Muscle and Leg Pain Stabilizes the Pelvic Ring Offers a Cushioned Abdominal Lift Pad Relieves pressure on the Sciatic Nerve Within Minutes    Locations that Carry Maternity/Pregnancy Support Belts  You can find belts on [email or web address] starting at $10-$15.  2.  [employer] carries belts for $10-$15.        Address/Phone: [address], [place],  [address], [phone]  3.  You may be able to file your insurance with [email or web address] and have a belt mailed to you.  If you have any problems getting the belt, let your [facility] office know.

## 2022-10-16 ENCOUNTER — Telehealth (INDEPENDENT_AMBULATORY_CARE_PROVIDER_SITE_OTHER): Payer: Medicaid Other | Admitting: Registered"

## 2022-10-16 ENCOUNTER — Encounter: Payer: Medicaid Other | Attending: Obstetrics & Gynecology | Admitting: Registered"

## 2022-10-16 DIAGNOSIS — O24112 Pre-existing diabetes mellitus, type 2, in pregnancy, second trimester: Secondary | ICD-10-CM

## 2022-10-16 DIAGNOSIS — O24312 Unspecified pre-existing diabetes mellitus in pregnancy, second trimester: Secondary | ICD-10-CM | POA: Insufficient documentation

## 2022-10-16 DIAGNOSIS — O0992 Supervision of high risk pregnancy, unspecified, second trimester: Secondary | ICD-10-CM | POA: Insufficient documentation

## 2022-10-16 DIAGNOSIS — O24912 Unspecified diabetes mellitus in pregnancy, second trimester: Secondary | ICD-10-CM | POA: Diagnosis present

## 2022-10-16 DIAGNOSIS — E119 Type 2 diabetes mellitus without complications: Secondary | ICD-10-CM | POA: Diagnosis not present

## 2022-10-16 LAB — CULTURE, OB URINE: Special Requests: NORMAL

## 2022-10-16 LAB — GC/CHLAMYDIA PROBE AMP (~~LOC~~) NOT AT ARMC
Chlamydia: NEGATIVE
Comment: NEGATIVE
Comment: NORMAL
Neisseria Gonorrhea: NEGATIVE

## 2022-10-16 NOTE — Progress Notes (Signed)
Virtual Visit via Video Note  I connected with Courtney Holloway on 10/16/22 at 1537 EDT by a video enabled telemedicine application and verified that I am speaking with the correct person using two identifiers.  Location: Patient: home Provider: Pharmacist, [facility], [place]   I discussed the limitations of evaluation and management by telemedicine and the availability of in person appointments. The patient expressed understanding and agreed to proceed.  Patient was seen for Diabetes during pregnancy self-management   Start time 1538 and End time 1604   Estimated due date: 01/11/2023; [redacted]w[redacted]d  History of Present Illness: (From initial visit) Pre-DM x/2 yrs, pt reports used insulin x2 months at first and was followed by MD every 2 weeks until BG brought down. Pt states she lost a lot of weight, changed diet including more fruits and vegetables to avoid developing diabetes. Pt states she has h/o of depression and is feeling depressed about gaining back the weight she lost.    Clinical: Medications: reviewed Medical History: prediabetes, food allergy (fish) Labs: 1-hr GTT 194 mg/dL @ 19w, A1c 5.7- 5.9% over last 2 yrs  Dietary and Lifestyle History: Pt states she is serious about taking care of her health. Pt states one of her barriers to healthcare is transportation. Pt states she has a number to call for rides, but no one answers.   Pt states she is upset that she has gained 7 lbs. Pt states she has been having increased nausea again and having a hard time eating enough and feels like starving baby.  Pt reports hospital visit yesterday due to severe back pain and when she checked blood sugar was in 200s.   Plan: Patient will come to appointment in person to start sample Dexcom to get a better idea of her blood sugar control.  Medicaid transportation phone numbers provided via mychart   I discussed the assessment and treatment plan with the patient. The patient was provided an  opportunity to ask questions and all were answered. The patient agreed with the plan and demonstrated an understanding of the instructions.   The patient was advised to call back or seek an in-person evaluation if the symptoms worsen or if the condition fails to improve as anticipated.  I provided 26 minutes of non-face-to-face time during this encounter.   Levie Heritage, RD, LDN, CDCES

## 2022-10-19 ENCOUNTER — Telehealth: Payer: Self-pay | Admitting: Obstetrics & Gynecology

## 2022-10-19 NOTE — Telephone Encounter (Signed)
Called patient to review BP.  She already came to MAU and was seen.  Per pt she was told it was not preE but rather anxiety.  She was given Flexeril, which she reports makes her very sleepy.  She notes considerable back pain.  Advised heating pack and feet up when possible.  Questions and concerns were addressed.  Reviewed preeclampsia precautions and has appt tomorrow.  Janyth Pupa, DO Attending [place], [facility] for [employer], [place]

## 2022-10-20 ENCOUNTER — Encounter: Payer: Medicaid Other | Attending: Obstetrics & Gynecology | Admitting: Registered"

## 2022-10-20 ENCOUNTER — Ambulatory Visit (INDEPENDENT_AMBULATORY_CARE_PROVIDER_SITE_OTHER): Payer: Medicaid Other | Admitting: Obstetrics & Gynecology

## 2022-10-20 ENCOUNTER — Other Ambulatory Visit: Payer: Self-pay

## 2022-10-20 VITALS — BP 115/73 | HR 132 | Wt 249.2 lb

## 2022-10-20 DIAGNOSIS — O0993 Supervision of high risk pregnancy, unspecified, third trimester: Secondary | ICD-10-CM

## 2022-10-20 DIAGNOSIS — O24113 Pre-existing diabetes mellitus, type 2, in pregnancy, third trimester: Secondary | ICD-10-CM

## 2022-10-20 DIAGNOSIS — Z3A28 28 weeks gestation of pregnancy: Secondary | ICD-10-CM

## 2022-10-20 DIAGNOSIS — O99213 Obesity complicating pregnancy, third trimester: Secondary | ICD-10-CM

## 2022-10-20 NOTE — Progress Notes (Signed)
PRENATAL VISIT NOTE  Subjective:  Courtney Holloway is a 21 y.o. G1P0 at [redacted]w[redacted]d being seen today for ongoing prenatal care.  She is currently monitored for the following issues for this high-risk pregnancy and has GAD (generalized anxiety disorder); Asthma; Slow transit constipation; Hidradenitis axillaris; Dyslipidemia; Post-traumatic stress disorder; MDD (major depressive disorder), recurrent severe, without psychosis ([place]); Attention deficit hyperactivity disorder (ADHD), combined type; Drug overdose, multiple drugs, undetermined intent, initial encounter; Anal fissure; Nightmares associated with chronic post-traumatic stress disorder; Prediabetes; Body mass index (BMI) of 39.0 to 39.9 in adult; Elevated blood pressure reading without diagnosis of hypertension; Enlarged tonsils; Supervision of high-risk pregnancy; Maternal morbid obesity, antepartum ([place]); Diabetes mellitus during pregnancy in third trimester; and GBS bacteriuria on their problem list.  Patient reports  occasional SOB and palpitations, feels lightheaded sometimes.  Does not want to go to hospital for evaluation .  Contractions: Not present. Vag. Bleeding: None.  Movement: Present. Denies leaking of fluid.   The following portions of the patient's history were reviewed and updated as appropriate: allergies, current medications, past family history, past medical history, past social history, past surgical history and problem list.   Objective:   Vitals:   10/20/22 1443 10/20/22 1451  BP: 115/73   Pulse: (!) 127 (!) 132  Weight: 249 lb 3.2 oz (113 kg)     Fetal Status: Fetal Heart Rate (bpm): 147   Movement: Present     General:  Alert, oriented and cooperative. Patient is in no acute distress.  Skin: Skin is warm and dry. No rash noted.   Cardiovascular: Normal heart rate noted  Respiratory: Normal respiratory effort, no problems with respiration noted  Abdomen: Soft, gravid, appropriate for gestational age.   Pain/Pressure: Present     Pelvic: Cervical exam deferred        Extremities: Normal range of motion.     Mental Status: Normal mood and affect. Normal behavior. Normal judgment and thought content.   No results found.  Assessment and Plan:  Pregnancy: G1P0 at [redacted]w[redacted]d 1. Pre-existing type 2 diabetes mellitus during pregnancy in third trimester Did not bring CBGs, followed by Levie Heritage, has appointment after this.  She will send her CBGs via MyChart, reports fastings in 70s, PPs in 130s-150s, some up to 190s. Will get Dexcom soon.  Once I review CBGs, will likely start Lantus or Levemir 10 U [place] qd or bid as needed initially and titrate as needed.  Already scheduled for growth scans, will need antenatal testing starting at 32 weeks.  2. Maternal morbid obesity, antepartum ([place]) Scans and antenatal testing as per MFM.  3. [redacted] weeks gestation of pregnancy 4. Supervision of high risk pregnancy in third trimester Patient was told to go to MAU for worsening SOB, palpitations.  Preterm labor symptoms and general obstetric precautions including but not limited to vaginal bleeding, contractions, leaking of fluid and fetal movement were reviewed in detail with the patient. Please refer to After Visit Summary for other counseling recommendations.   Return in about 2 weeks (around 11/03/2022) for OFFICE OB VISIT (MD only).  Future Appointments  Date Time Provider [place]  10/20/2022  4:15 PM Christella Hartigan, RD [place] [facility]  10/22/2022  3:15 PM [facility] NURSE [facility]  10/22/2022  3:30 PM [facility] US3 [facility]  11/03/2022  1:35 PM Chancy Milroy, MD [facility]  11/17/2022  1:15 PM Chancy Milroy, MD [facility]  11/19/2022 10:30 AM [facility] NURSE [facility]  11/19/2022 10:45  AM [facility] US5 [facility]  12/01/2022  1:55 PM Chancy Milroy, MD [facility]    Verita Schneiders, MD

## 2022-10-20 NOTE — Progress Notes (Unsigned)
CGM Training - Dexcom G6 Lot # [id number] Exp# 2023-07-08  Start time:0414    End time: 3299 Total time: 60  [x]   Download Dexcom G6 and Clarity apps to phone [x]   Accept invite to share data through Clarity [x]   Getting to know device: Sensor:  2 hour warmup for new sensor/transmitter Waterproof Receiver: Phone vs reader. Turn on Location and bluetooth [x]   Setting up device (high alert  200 m/dL, low alert 70 mg/dL) []   Rise alert set at   off mg/dL  []   Fall alert set at    off  mg/dL  [x]   Setting alert profile: alarms are very loud. Also, not to panic if getting message that blood sugar is dropping []   Inserting sensor: 3" away from insulin pump - in line of site if using automated mode After application it takes 2 hr initial warm up before first reading.  [x]   Calibrating  [x]   Ending sensor session [x]   Lag time [x]   Trend arrows [x]   Going through security (don't put sensors in bags that go through xray) / MRI / CT scans [x]   Trouble shooting:   1000mg  acetaminophen every 6 hrs [x]   Tape guide:  [x]   Insulin dosing from CGM readings.   Pt states today MD said that will likely start long acting insulin. Pt states she had used Victoza before. CDCES reviewed how to use insulin pen and patient states she would be comfortable using insulin pen.  Patient has Dexcom tech support and my contact information.

## 2022-10-20 NOTE — Progress Notes (Addendum)
Pulse ox 99

## 2022-10-21 ENCOUNTER — Telehealth: Payer: Medicaid Other | Admitting: Physician Assistant

## 2022-10-21 ENCOUNTER — Encounter: Payer: Self-pay | Admitting: Obstetrics & Gynecology

## 2022-10-21 DIAGNOSIS — J029 Acute pharyngitis, unspecified: Secondary | ICD-10-CM

## 2022-10-21 NOTE — Progress Notes (Signed)
Because of a swollen uvula which if very inflamed and effecting swallowing and breathing, you may need steroid or similar medication, and giving pregnancy status and need to be cautious with what is given, I feel your condition warrants further evaluation and I recommend that you be seen in a face to face visit.   NOTE: There will be NO CHARGE for this eVisit   If you are having a true medical emergency please call 911.      For an urgent face to face visit, [place] has seven urgent care centers for your convenience:     [facility] [place] [facility] [place] Get Driving Directions [phone] [place], [place] [address]    [place] [facility] [place]) Get Driving Directions [phone] [place], [place] [address]  [facility] [place] ([place]) Get Driving Directions [phone] [address] [place],  [place]  [address]  [place] [facility] [place] [facility] - at [address] Get Driving Directions  [phone] [place],  [place] [address]   [facility]  Get Driving Directions [phone] [place], [place], [place] [address]   [facility] [place] Get Driving Directions  [phone] [address] [place], [place] [address]   [facility] [place] Get Driving Directions [phone] [address], [place], [place] [address]  Your MyChart E-visit questionnaire answers were reviewed by a board certified advanced clinical practitioner to complete your personal care plan based on your specific symptoms.  Thank you for using e-Visits.

## 2022-10-22 ENCOUNTER — Ambulatory Visit: Payer: Medicaid Other | Admitting: *Deleted

## 2022-10-22 ENCOUNTER — Other Ambulatory Visit: Payer: Self-pay | Admitting: *Deleted

## 2022-10-22 ENCOUNTER — Encounter: Payer: Self-pay | Admitting: Obstetrics & Gynecology

## 2022-10-22 ENCOUNTER — Encounter: Payer: Self-pay | Admitting: Registered"

## 2022-10-22 ENCOUNTER — Ambulatory Visit: Payer: Medicaid Other | Attending: Maternal & Fetal Medicine

## 2022-10-22 VITALS — BP 113/68 | HR 122

## 2022-10-22 DIAGNOSIS — O0993 Supervision of high risk pregnancy, unspecified, third trimester: Secondary | ICD-10-CM | POA: Diagnosis present

## 2022-10-22 DIAGNOSIS — J45909 Unspecified asthma, uncomplicated: Secondary | ICD-10-CM | POA: Diagnosis not present

## 2022-10-22 DIAGNOSIS — O99213 Obesity complicating pregnancy, third trimester: Secondary | ICD-10-CM

## 2022-10-22 DIAGNOSIS — O24414 Gestational diabetes mellitus in pregnancy, insulin controlled: Secondary | ICD-10-CM

## 2022-10-22 DIAGNOSIS — E669 Obesity, unspecified: Secondary | ICD-10-CM

## 2022-10-22 DIAGNOSIS — O99893 Other specified diseases and conditions complicating puerperium: Secondary | ICD-10-CM

## 2022-10-22 DIAGNOSIS — R7303 Prediabetes: Secondary | ICD-10-CM | POA: Insufficient documentation

## 2022-10-22 DIAGNOSIS — O99210 Obesity complicating pregnancy, unspecified trimester: Secondary | ICD-10-CM | POA: Insufficient documentation

## 2022-10-22 DIAGNOSIS — O99212 Obesity complicating pregnancy, second trimester: Secondary | ICD-10-CM | POA: Insufficient documentation

## 2022-10-22 DIAGNOSIS — O24113 Pre-existing diabetes mellitus, type 2, in pregnancy, third trimester: Secondary | ICD-10-CM

## 2022-10-22 DIAGNOSIS — Z3A28 28 weeks gestation of pregnancy: Secondary | ICD-10-CM

## 2022-10-22 DIAGNOSIS — O24913 Unspecified diabetes mellitus in pregnancy, third trimester: Secondary | ICD-10-CM

## 2022-10-22 MED ORDER — DEXCOM G6 SENSOR MISC: 12 refills | Status: DC

## 2022-10-22 MED ORDER — INSULIN GLARGINE 100 UNIT/ML SOLOSTAR PEN: 10.0000 [IU] | PEN_INJECTOR | Freq: Every day | SUBCUTANEOUS | 11 refills | Status: DC

## 2022-10-22 MED ORDER — "PEN NEEDLES 3/16"" 31G X 5 MM MISC": 2 refills | Status: DC

## 2022-10-22 NOTE — Progress Notes (Signed)
Patient was started on a sample Dexcom CGM. Here are readings Nov 7-8

## 2022-10-23 ENCOUNTER — Other Ambulatory Visit: Payer: Self-pay | Admitting: *Deleted

## 2022-10-23 DIAGNOSIS — O24419 Gestational diabetes mellitus in pregnancy, unspecified control: Secondary | ICD-10-CM

## 2022-10-25 ENCOUNTER — Other Ambulatory Visit: Payer: Self-pay | Admitting: Obstetrics & Gynecology

## 2022-10-25 DIAGNOSIS — O24113 Pre-existing diabetes mellitus, type 2, in pregnancy, third trimester: Secondary | ICD-10-CM

## 2022-10-27 ENCOUNTER — Encounter: Payer: Self-pay | Admitting: Obstetrics & Gynecology

## 2022-10-27 ENCOUNTER — Telehealth: Payer: Medicaid Other | Admitting: Registered"

## 2022-10-27 ENCOUNTER — Telehealth: Payer: Self-pay | Admitting: Registered"

## 2022-10-27 DIAGNOSIS — Z658 Other specified problems related to psychosocial circumstances: Secondary | ICD-10-CM

## 2022-10-27 NOTE — Progress Notes (Deleted)
Community Health Assessment    Courtney Holloway
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
## 2022-10-29 NOTE — BH Specialist Note (Deleted)
Integrated Behavioral Health via Telemedicine Visit  10/29/2022 MAKITA BLOW [id number]  Number of Integrated Behavioral Health Clinician visits: 2- Second Visit  Session Start time: 1607   Session End time: 1647  Total time in minutes: 40   Referring Provider: *** Patient/Family location: Home*** [facility] location: [facility]  All persons participating in visit: Patient Courtney Holloway and [facility] Courtney Holloway ***  Types of Service: {CHL AMB TYPE OF SERVICE:[id number]}  I connected with Courtney Holloway and/or Courtney Holloway's {family members:20773} via  Telephone or Video Enabled Telemedicine Application  (Video is Caregility application) and verified that I am speaking with the correct person using two identifiers. Discussed confidentiality: Yes   I discussed the limitations of telemedicine and the availability of in person appointments.  Discussed there is a possibility of technology failure and discussed alternative modes of communication if that failure occurs.  I discussed that engaging in this telemedicine visit, they consent to the provision of behavioral healthcare and the services will be billed under their insurance.  Patient and/or legal guardian expressed understanding and consented to Telemedicine visit: Yes   Presenting Concerns: Patient and/or family reports the following symptoms/concerns: *** Duration of problem: ***; Severity of problem: {Mild/Moderate/Severe:20260}  Patient and/or Family's Strengths/Protective Factors: {CHL AMB BH PROTECTIVE [phone]}  Goals Addressed: Patient will:  Reduce symptoms of: {IBH Symptoms:21014056}   Increase knowledge and/or ability of: {IBH Patient Tools:21014057}   Demonstrate ability to: {IBH Goals:21014053}  Progress towards Goals: Ongoing  Interventions: Interventions utilized:  {IBH Interventions:21014054} Standardized Assessments  completed: {IBH Screening Tools:21014051}  Patient and/or Family Response: Patient agrees with treatment plan. ***  Assessment: Patient currently experiencing ***.   Patient may benefit from continued therapeutic interventions***.  Plan: Follow up with behavioral health clinician on : *** Behavioral recommendations:  -*** -*** Referral(s): {IBH Referrals:21014055}  I discussed the assessment and treatment plan with the patient and/or parent/guardian. They were provided an opportunity to ask questions and all were answered. They agreed with the plan and demonstrated an understanding of the instructions.   They were advised to call back or seek an in-person evaluation if the symptoms worsen or if the condition fails to improve as anticipated.  Rae Lips, LCSW     10/06/2022   11:43 AM 08/04/2022    2:16 PM 07/02/2022    9:06 AM 01/13/2022   11:38 AM 11/14/2021    1:01 PM  Depression screen PHQ 2/9  Decreased Interest 1 1 3 2 2   Down, Depressed, Hopeless 1 1 3 3 2   PHQ - 2 Score 2 2 6 5 4   Altered sleeping 1 2 3 3 2   Tired, decreased energy 1 2 3 3 2   Change in appetite 1 2 3 3 2   Feeling bad or failure about yourself  1 2 3 3 2   Trouble concentrating 0 0 0 0 2  Moving slowly or fidgety/restless 0 0 0 0 0  Suicidal thoughts 0 0 0    PHQ-9 Score 6 10 18 17 14       10/06/2022   11:43 AM 08/04/2022    2:16 PM 07/02/2022    9:06 AM 01/13/2022   11:38 AM  GAD 7 : Generalized Anxiety Score  Nervous, Anxious, on Edge 1 2 2 2   Control/stop worrying 1 2 2 2   Worry too much - different things 1 2 3 2   Trouble relaxing 1 2 2 2   Restless 0 0 0  0  Easily annoyed or irritable 1 2 3 3   Afraid - awful might happen 1 2 2 3   Total GAD 7 Score 6 12 14  14

## 2022-10-31 ENCOUNTER — Encounter (HOSPITAL_COMMUNITY): Payer: Self-pay | Admitting: Obstetrics and Gynecology

## 2022-10-31 ENCOUNTER — Observation Stay (HOSPITAL_COMMUNITY)
Admission: AD | Admit: 2022-10-31 | Discharge: 2022-11-02 | Disposition: A | Discharge status: Other Acute Inpatient Hospital | Payer: Medicaid Other | Attending: Obstetrics and Gynecology | Admitting: Obstetrics and Gynecology

## 2022-10-31 DIAGNOSIS — R102 Pelvic and perineal pain: Secondary | ICD-10-CM | POA: Diagnosis not present

## 2022-10-31 DIAGNOSIS — R45851 Suicidal ideations: Secondary | ICD-10-CM | POA: Diagnosis not present

## 2022-10-31 DIAGNOSIS — O99513 Diseases of the respiratory system complicating pregnancy, third trimester: Secondary | ICD-10-CM | POA: Insufficient documentation

## 2022-10-31 DIAGNOSIS — Z79899 Other long term (current) drug therapy: Secondary | ICD-10-CM | POA: Diagnosis not present

## 2022-10-31 DIAGNOSIS — O99891 Other specified diseases and conditions complicating pregnancy: Secondary | ICD-10-CM | POA: Diagnosis not present

## 2022-10-31 DIAGNOSIS — J45909 Unspecified asthma, uncomplicated: Secondary | ICD-10-CM | POA: Insufficient documentation

## 2022-10-31 DIAGNOSIS — Z3A30 30 weeks gestation of pregnancy: Secondary | ICD-10-CM | POA: Diagnosis not present

## 2022-10-31 DIAGNOSIS — Z7982 Long term (current) use of aspirin: Secondary | ICD-10-CM | POA: Insufficient documentation

## 2022-10-31 DIAGNOSIS — O26893 Other specified pregnancy related conditions, third trimester: Secondary | ICD-10-CM | POA: Diagnosis present

## 2022-10-31 DIAGNOSIS — O99343 Other mental disorders complicating pregnancy, third trimester: Secondary | ICD-10-CM | POA: Diagnosis not present

## 2022-10-31 DIAGNOSIS — Z794 Long term (current) use of insulin: Secondary | ICD-10-CM | POA: Diagnosis not present

## 2022-10-31 DIAGNOSIS — O24414 Gestational diabetes mellitus in pregnancy, insulin controlled: Secondary | ICD-10-CM | POA: Diagnosis not present

## 2022-10-31 DIAGNOSIS — Z87891 Personal history of nicotine dependence: Secondary | ICD-10-CM | POA: Diagnosis not present

## 2022-10-31 DIAGNOSIS — F332 Major depressive disorder, recurrent severe without psychotic features: Secondary | ICD-10-CM | POA: Diagnosis not present

## 2022-10-31 DIAGNOSIS — O0993 Supervision of high risk pregnancy, unspecified, third trimester: Secondary | ICD-10-CM

## 2022-10-31 LAB — URINALYSIS, ROUTINE W REFLEX MICROSCOPIC
Bilirubin Urine: NEGATIVE
Glucose, UA: NEGATIVE mg/dL
Hgb urine dipstick: NEGATIVE
Ketones, ur: NEGATIVE mg/dL
Nitrite: NEGATIVE
Protein, ur: 100 mg/dL — AB
Specific Gravity, Urine: 1.032 — ABNORMAL HIGH (ref 1.005–1.030)
pH: 5 (ref 5.0–8.0)

## 2022-10-31 LAB — GLUCOSE, CAPILLARY: Glucose-Capillary: 128 mg/dL — ABNORMAL HIGH (ref 70–99)

## 2022-10-31 MED ORDER — LACTATED RINGERS IV SOLN: 125.0000 mL/h | INTRAVENOUS | Status: AC

## 2022-10-31 MED ORDER — DOCUSATE SODIUM 100 MG PO CAPS
100.0000 mg | ORAL_CAPSULE | Freq: Every day | ORAL | Status: DC
  Administered 2022-11-01: 100 mg via ORAL
  Filled 2022-10-31: qty 1

## 2022-10-31 MED ORDER — PRENATAL MULTIVITAMIN CH
1.0000 | ORAL_TABLET | Freq: Every day | ORAL | Status: DC
  Administered 2022-10-31 – 2022-11-01 (×2): 1 via ORAL
  Filled 2022-10-31 (×2): qty 1

## 2022-10-31 MED ORDER — ZOLPIDEM TARTRATE 5 MG PO TABS
5.0000 mg | ORAL_TABLET | Freq: Every evening | ORAL | Status: DC | PRN
  Administered 2022-10-31: 5 mg via ORAL
  Filled 2022-10-31: qty 1

## 2022-10-31 MED ORDER — CALCIUM CARBONATE ANTACID 500 MG PO CHEW: 2.0000 | CHEWABLE_TABLET | ORAL | Status: DC | PRN

## 2022-10-31 MED ORDER — INSULIN GLARGINE-YFGN 100 UNIT/ML ~~LOC~~ SOLN
10.0000 [IU] | Freq: Every day | SUBCUTANEOUS | Status: DC
  Administered 2022-11-01 (×2): 10 [IU] via SUBCUTANEOUS
  Filled 2022-10-31 (×3): qty 0.1

## 2022-10-31 MED ORDER — INSULIN ASPART 100 UNIT/ML IJ SOLN: 0.0000 [IU] | Freq: Three times a day (TID) | INTRAMUSCULAR | Status: DC

## 2022-10-31 MED ORDER — ACETAMINOPHEN 325 MG PO TABS
650.0000 mg | ORAL_TABLET | ORAL | Status: DC | PRN
  Administered 2022-10-31: 650 mg via ORAL
  Filled 2022-10-31: qty 2
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
## 2022-10-31 NOTE — MAU Note (Signed)
...  Courtney Holloway is a 21 y.o. at [redacted]w[redacted]d here in MAU reporting: Constant lower abdominal pain, mid to lower back pain, bilateral swelling of legs and feet, and pelvic pain with sitting. She reports the lower abdominal pain and mid to lower back pain started three days ago. She reports nothing makes the pain better or worse. She states she has taken Flexeril, last dose was yesterday. She reports this did not alleviate her pains. She reports her pains worsen at night and this prevents her from sleeping well. She reports she noticed last night that her legs and feet were swollen and reports she is unable to put most of her shoes on. Denies VB or LOF. DFM. Has not felt any movement today.   Onset of complaint: x3 days ago Pain score:  10/10 mid to lower back 8/10 lower abdomen 9/10 pelvis  FHT:  Lab orders placed from triage:  UA
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
## 2022-10-31 NOTE — MAU Note (Signed)
TTS at bedside with patient. LCSWA called and speaking to patient.

## 2022-11-01 ENCOUNTER — Other Ambulatory Visit: Payer: Self-pay

## 2022-11-01 DIAGNOSIS — R45851 Suicidal ideations: Secondary | ICD-10-CM

## 2022-11-01 DIAGNOSIS — Z3689 Encounter for other specified antenatal screening: Secondary | ICD-10-CM | POA: Diagnosis not present

## 2022-11-01 LAB — COMPREHENSIVE METABOLIC PANEL
ALT: 10 U/L (ref 0–44)
AST: 10 U/L — ABNORMAL LOW (ref 15–41)
Albumin: 2.6 g/dL — ABNORMAL LOW (ref 3.5–5.0)
Alkaline Phosphatase: 55 U/L (ref 38–126)
Anion gap: 7 (ref 5–15)
BUN: 6 mg/dL (ref 6–20)
CO2: 19 mmol/L — ABNORMAL LOW (ref 22–32)
Calcium: 8.4 mg/dL — ABNORMAL LOW (ref 8.9–10.3)
Chloride: 109 mmol/L (ref 98–111)
Creatinine, Ser: 0.39 mg/dL — ABNORMAL LOW (ref 0.44–1.00)
GFR, Estimated: >60 mL/min (ref >60)
Glucose, Bld: 97 mg/dL (ref 70–99)
Potassium: 3.7 mmol/L (ref 3.5–5.1)
Sodium: 135 mmol/L (ref 135–145)
Total Bilirubin: 0.2 mg/dL — ABNORMAL LOW (ref 0.3–1.2)
Total Protein: 6 g/dL — ABNORMAL LOW (ref 6.5–8.1)

## 2022-11-01 LAB — GLUCOSE, CAPILLARY
Glucose-Capillary: 91 mg/dL (ref 70–99)
Glucose-Capillary: 121 mg/dL — ABNORMAL HIGH (ref 70–99)
Glucose-Capillary: 93 mg/dL (ref 70–99)
Glucose-Capillary: 108 mg/dL — ABNORMAL HIGH (ref 70–99)
Glucose-Capillary: 150 mg/dL — ABNORMAL HIGH (ref 70–99)

## 2022-11-01 MED ORDER — FLUCONAZOLE 150 MG PO TABS
150.0000 mg | ORAL_TABLET | Freq: Once | ORAL | Status: AC
  Administered 2022-11-01: 150 mg via ORAL
  Filled 2022-11-01: qty 1

## 2022-11-01 MED ORDER — INSULIN ASPART 100 UNIT/ML IJ SOLN
0.0000 [IU] | Freq: Three times a day (TID) | INTRAMUSCULAR | Status: DC
  Administered 2022-11-01 (×2): 2 [IU] via SUBCUTANEOUS

## 2022-11-01 MED ORDER — PANTOPRAZOLE SODIUM 20 MG PO TBEC
20.0000 mg | DELAYED_RELEASE_TABLET | Freq: Every day | ORAL | Status: DC
  Administered 2022-11-01: 20 mg via ORAL
  Filled 2022-11-01: qty 1

## 2022-11-01 MED ORDER — PROMETHAZINE HCL 25 MG PO TABS
12.5000 mg | ORAL_TABLET | ORAL | Status: DC | PRN
  Administered 2022-11-02: 12.5 mg via ORAL
  Filled 2022-11-01 (×2): qty 1

## 2022-11-01 MED ORDER — SERTRALINE HCL 50 MG PO TABS
50.0000 mg | ORAL_TABLET | Freq: Every day | ORAL | Status: DC
  Administered 2022-11-01: 50 mg via ORAL
  Filled 2022-11-01: qty 1

## 2022-11-01 NOTE — Progress Notes (Signed)
I just received a phone call from [facility].  Patient's transfer request has been approved.   I spoke with Jean Rosenthal , the RN in charge of 4 Neuro Unit where the patient will be admitted.  Direct phone number: [phone].  Patient will be admitted to [facility], accepting attending is Dr. Rema Fendt.   It is recommended that patient arrives tomorrow morning in order to be directly admitted to the floor, coming tonight will necessitate arrival in the ED and she may have to spend an inordinate amount og time there.  There will be a bed ready to accept her tomorrow morning.   Jerrell Mylar, RN (patient's RN and [facility] RN in charge) notified, we will contact [facility] here to set up transportation arrangement for 11/02/22 morning.  Patient notified.  For now, will continue current inpatient care.   Jaynie Collins, MD, FACOG Obstetrician & Gynecologist, [facility] for [employer], [facility]

## 2022-11-01 NOTE — Progress Notes (Signed)
Patient ID: Courtney Holloway, female   DOB: March 17, 2001, 21 y.o.   MRN: [id number] FACULTY PRACTICE ANTEPARTUM(COMPREHENSIVE) NOTE  VEERA DOCKENDORF is a 21 y.o. G1P0 at [redacted]w[redacted]d  who is admitted for observation following ingestion of excess amount of Aleeve and concern for depression with suicidal ideations .    Fetal presentation is unsure. Length of Stay:  0  Days  Date of admission:10/31/2022  Subjective: Patient reports feeling well. She denies any current suicidal ideations Patient reports the fetal movement as active. Patient reports uterine contraction  activity as none. Patient reports  vaginal bleeding as none. Patient describes fluid per vagina as None.  Vitals:  Blood pressure 125/74, pulse (!) 119, temperature 98.3 F (36.8 C), temperature source Oral, resp. rate 17, last menstrual period 04/01/2022, SpO2 100 %. Vitals:   10/31/22 1837 10/31/22 2224 10/31/22 2245 11/01/22 0356  BP: 104/64 119/72 (!) 113/57 125/74  Pulse: (!) 116 (!) 105 (!) 110 (!) 119  Resp:  19 19 17   Temp:  97.7 F (36.5 C) 97.6 F (36.4 C) 98.3 F (36.8 C)  TempSrc:  Oral Oral Oral  SpO2: 98% 99%  100%   Physical Examination: GENERAL: Well-developed, well-nourished female in no acute distress.  LUNGS: Clear to auscultation bilaterally.  HEART: Regular rate and rhythm. ABDOMEN: Soft, nontender, gravid PELVIC: Not indicated EXTREMITIES: No cyanosis, clubbing, or edema, 2+ distal pulses.   Fetal Monitoring:  baseline 140, mod variability, + accels, no decels    Toco- no contractions  Labs:  Results for orders placed or performed during the hospital encounter of 10/31/22 (from the past 24 hour(s))  Urinalysis, Routine w reflex microscopic Urine, Clean Catch   Collection Time: 10/31/22  6:30 PM  Result Value Ref Range   Color, Urine YELLOW YELLOW   APPearance CLOUDY (A) CLEAR   Specific Gravity, Urine 1.032 (H) 1.005 - 1.030   pH 5.0 5.0 - 8.0   Glucose, UA NEGATIVE NEGATIVE mg/dL   Hgb urine  dipstick NEGATIVE NEGATIVE   Bilirubin Urine NEGATIVE NEGATIVE   Ketones, ur NEGATIVE NEGATIVE mg/dL   Protein, ur 409 (A) NEGATIVE mg/dL   Nitrite NEGATIVE NEGATIVE   Leukocytes,Ua LARGE (A) NEGATIVE   RBC / HPF 6-10 0 - 5 RBC/hpf   WBC, UA 11-20 0 - 5 WBC/hpf   Bacteria, UA FEW (A) NONE SEEN   Squamous Epithelial / LPF 21-50 0 - 5   Mucus PRESENT    Budding Yeast PRESENT    Ca Oxalate Crys, UA PRESENT   Glucose, capillary   Collection Time: 10/31/22 11:08 PM  Result Value Ref Range   Glucose-Capillary 128 (H) 70 - 99 mg/dL  Glucose, capillary   Collection Time: 11/01/22  7:43 AM  Result Value Ref Range   Glucose-Capillary 91 70 - 99 mg/dL    Imaging Studies:    No results found.   Medications:  Scheduled  docusate sodium  100 mg Oral Daily   insulin aspart  0-15 Units Subcutaneous TID PC & HS   insulin glargine-yfgn  10 Units Subcutaneous QHS   pantoprazole  20 mg Oral Daily   prenatal multivitamin  1 tablet Oral Q1200     ASSESSMENT: G1P0 [redacted]w[redacted]d Estimated Date of Delivery: 01/11/23  Patient Active Problem List   Diagnosis Date Noted   Suicidal ideation 10/31/2022   GBS bacteriuria 10/06/2022   Diabetes mellitus during pregnancy in third trimester 09/15/2022   Maternal morbid obesity, antepartum (HCC) 07/16/2022   Supervision of high-risk pregnancy 06/18/2022  Elevated blood pressure reading without diagnosis of hypertension 01/13/2022   Enlarged tonsils 01/13/2022   Body mass index (BMI) of 39.0 to 39.9 in adult 09/02/2021   Prediabetes 08/04/2021   Nightmares associated with chronic post-traumatic stress disorder 04/01/2021   Anal fissure 03/28/2021   Drug overdose, multiple drugs, undetermined intent, initial encounter 02/02/2021   Attention deficit hyperactivity disorder (ADHD), combined type 01/28/2021   MDD (major depressive disorder), recurrent severe, without psychosis (HCC) 01/16/2021   Dyslipidemia 02/15/2018   Hidradenitis axillaris 07/11/2016   Slow  transit constipation 11/21/2014   Asthma 05/04/2013   GAD (generalized anxiety disorder) 11/02/2012   Post-traumatic stress disorder 11/02/2012    PLAN: - Discussed benefits of Zoloft initiation in order to stay ahead of postpartum depression. Patient is considering it but is not currently interested - Continue insulin at current dosage - Maternal/fetal status currently stable - Continue routine antepartum care - Discharge pending psych consultation  Brice Potteiger 11/01/2022,8:05 AM

## 2022-11-01 NOTE — Progress Notes (Signed)
Talked to Dr. Jannifer Franklin (Psychiatry) about patient, his recommendation is for inpatient psychiatric care given her suicidal ideation and many associated risk factors.  Patient is agreeable to this plan.  Will call [facility] and then [facility] to arrange for inpatient transfer for this patient. Patient was informed about this plan of care.  Appreciate the input and recommendations from Dr. Jannifer Franklin.  Until patient is transferred, she will remain admitted here at [facility] and will have 1:1 sitter, Psych care and routine antenatal care.   Jaynie Collins, MD

## 2022-11-01 NOTE — Progress Notes (Signed)
No response yet from [facility].  Contacted [facility] Transfer Portal, sent in [facility] Psychiatry Request for Admission Form and requested documents (Demographic information, Psychiatry Notes, Inpatient Notes, Labs). Faxed documents to [phone].  Will await their response.  Will continue current inpatient care for patient.    Jaynie Collins, MD, FACOG Obstetrician & Gynecologist, [facility] for [employer], [facility]

## 2022-11-01 NOTE — Plan of Care (Signed)
  Problem: Education: Goal: Knowledge of disease or condition will improve Outcome: Progressing Goal: Knowledge of the prescribed therapeutic regimen will improve Outcome: Progressing Goal: Individualized Educational Video(s) Outcome: Progressing   Problem: Clinical Measurements: Goal: Complications related to the disease process, condition or treatment will be avoided or minimized Outcome: Progressing   
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
## 2022-11-02 DIAGNOSIS — O99343 Other mental disorders complicating pregnancy, third trimester: Secondary | ICD-10-CM | POA: Diagnosis not present

## 2022-11-02 DIAGNOSIS — Z3689 Encounter for other specified antenatal screening: Secondary | ICD-10-CM | POA: Diagnosis not present

## 2022-11-02 DIAGNOSIS — R102 Pelvic and perineal pain: Secondary | ICD-10-CM | POA: Diagnosis not present

## 2022-11-02 DIAGNOSIS — R45851 Suicidal ideations: Secondary | ICD-10-CM | POA: Diagnosis not present

## 2022-11-02 DIAGNOSIS — Z794 Long term (current) use of insulin: Secondary | ICD-10-CM | POA: Diagnosis not present

## 2022-11-02 DIAGNOSIS — Z7982 Long term (current) use of aspirin: Secondary | ICD-10-CM | POA: Diagnosis not present

## 2022-11-02 DIAGNOSIS — O99891 Other specified diseases and conditions complicating pregnancy: Secondary | ICD-10-CM | POA: Diagnosis not present

## 2022-11-02 DIAGNOSIS — O26893 Other specified pregnancy related conditions, third trimester: Secondary | ICD-10-CM | POA: Diagnosis present

## 2022-11-02 DIAGNOSIS — Z3A30 30 weeks gestation of pregnancy: Secondary | ICD-10-CM | POA: Diagnosis not present

## 2022-11-02 DIAGNOSIS — J45909 Unspecified asthma, uncomplicated: Secondary | ICD-10-CM | POA: Diagnosis not present

## 2022-11-02 DIAGNOSIS — O24414 Gestational diabetes mellitus in pregnancy, insulin controlled: Secondary | ICD-10-CM | POA: Diagnosis not present

## 2022-11-02 DIAGNOSIS — O99513 Diseases of the respiratory system complicating pregnancy, third trimester: Secondary | ICD-10-CM | POA: Diagnosis not present

## 2022-11-02 DIAGNOSIS — Z79899 Other long term (current) drug therapy: Secondary | ICD-10-CM | POA: Diagnosis not present

## 2022-11-02 DIAGNOSIS — F332 Major depressive disorder, recurrent severe without psychotic features: Secondary | ICD-10-CM | POA: Diagnosis not present

## 2022-11-02 DIAGNOSIS — Z87891 Personal history of nicotine dependence: Secondary | ICD-10-CM | POA: Diagnosis not present

## 2022-11-02 LAB — GLUCOSE, CAPILLARY: Glucose-Capillary: 87 mg/dL (ref 70–99)

## 2022-11-02 LAB — CULTURE, OB URINE

## 2022-11-02 MED ORDER — SERTRALINE HCL 50 MG PO TABS: 50.0000 mg | ORAL_TABLET | Freq: Every day | ORAL | 3 refills | Status: DC

## 2022-11-02 NOTE — Discharge Summary (Signed)
Physician Discharge Summary  Patient ID: Courtney Holloway MRN: [id number] DOB/AGE: 19/19/2002 19 y.o.  Admit date: 10/31/2022 Discharge date: 11/02/2022  Admission Diagnoses:Suicidal ideations  Discharge Diagnoses:  Principal Problem:   Suicidal ideation Active Problems:   MDD (major depressive disorder), recurrent severe, without psychosis ([place])   Discharged Condition: fair  Hospital Course: Patient is G1P0 at 19w0dadmitted for observation following her reported ingestion of several tablets of Aleeve and muscle relaxer. Patient has no obstetrical complaints. Fetal status remained reassuring throughout her admission. Following psychiatric consultations, recommendation was to start Zoloft and for inpatient admission to behavioral health. Patient is being transferred to [facility] Patient to continue Semglee 10 units at bedtime for GDMA2 and a diabetic diet. Patient scheduled for ROB on 11/17/2022  Consults: psychiatry   Treatments: Zoloft intiated  Discharge Exam: Blood pressure 128/70, pulse (!) 109, temperature 98.7 F (37.1 C), temperature source Oral, resp. rate 17, height _0  (1.549 m), weight 113.9 kg, last menstrual period 04/01/2022, SpO2 100 %. GENERAL: Well-developed, well-nourished female in no acute distress.  LUNGS: Clear to auscultation bilaterally.  HEART: Regular rate and rhythm. ABDOMEN: Soft, nontender,gravid PELVIC: Not indicated EXTREMITIES: No cyanosis, clubbing, or edema, 2+ distal pulses. FHT baseline 150, mod variability, +accels, no decels Toco: no contractions  [facility] and answers to [facility].         Allergies as of 11/02/2022       Reactions   Other Anaphylaxis, Itching   Seasonal allergies, dog fur, certain detergents  Pt. Reports mother has used "epi-pen" for past allergies.    Pineapple Anaphylaxis   Fish-derived Products Diarrhea, Hives, Itching   Zofran [ondansetron Hcl] Nausea And  Vomiting        Medication List     TAKE these medications    Accu-Chek Guide test strip Generic drug: glucose blood Use to check blood sugars four times a day was instructed   Accu-Chek Guide w/Device Kit 1 Device by Does not apply route 4 (four) times daily.   Accu-Chek Softclix Lancets lancets Use as instructed   acetaminophen 500 MG tablet Commonly known as: TYLENOL Take 500 mg by mouth every 6 (six) hours as needed.   ASPIRIN 81 PO Take 1 tablet by mouth daily.   cyclobenzaprine 10 MG tablet Commonly known as: FLEXERIL Take 1 tablet (10 mg total) by mouth 3 (three) times daily as needed for muscle spasms (or headache).   Dexcom G6 Sensor Misc USE AS DIRECTED   EPINEPHrine 0.3 mg/0.3 mL Soaj injection Commonly known as: EpiPen 2-Pak Inject 0.3 mg into the muscle as needed for anaphylaxis.   insulin glargine 100 UNIT/ML Solostar Pen Commonly known as: LANTUS Inject 10 Units into the skin at bedtime.   Pen Needles 3/16" 31G X 5 MM Misc Use with insulin pen daily @ bedtime   PrePLUS 27-1 MG Tabs Take 1 tablet by mouth daily.   promethazine 25 MG tablet Commonly known as: PHENERGAN Take 1 tablet (25 mg total) by mouth every 6 (six) hours as needed for nausea or vomiting.   sertraline 50 MG tablet Commonly known as: ZOLOFT Take 1 tablet (50 mg total) by mouth daily.         Signed: Sareen Randon 11/02/2022, 12:00 PM

## 2022-11-03 ENCOUNTER — Encounter: Payer: Medicaid Other | Admitting: Obstetrics and Gynecology

## 2022-11-04 ENCOUNTER — Telehealth: Payer: Self-pay

## 2022-11-04 DIAGNOSIS — O24113 Pre-existing diabetes mellitus, type 2, in pregnancy, third trimester: Secondary | ICD-10-CM

## 2022-11-04 MED ORDER — DEXCOM G6 SENSOR MISC: 11 refills | Status: DC

## 2022-11-04 MED ORDER — DEXCOM G6 TRANSMITTER MISC: 3 refills | Status: DC

## 2022-11-04 NOTE — Telephone Encounter (Addendum)
-----   Message from Carolan Shiver, RD sent at 10/31/2022 10:33 AM EST ----- Regarding: prior auth for Dexcom G6 Sensor Could someone look into the prior auth for her Dexcom G6 sensor?  Thanks  Marylene Land  ---------------------  Called [place] [employer] who gives direction to CGM PA form. Form printed and filled out. Needs provider signature; Anyanwu MD in office tomorrow to sign. Will fax to [facility] for review. PA also completed for Dexcom G6 Transmitter.

## 2022-11-11 ENCOUNTER — Telehealth (INDEPENDENT_AMBULATORY_CARE_PROVIDER_SITE_OTHER): Payer: Self-pay | Admitting: Registered"

## 2022-11-11 DIAGNOSIS — O24113 Pre-existing diabetes mellitus, type 2, in pregnancy, third trimester: Secondary | ICD-10-CM

## 2022-11-17 ENCOUNTER — Encounter: Payer: Self-pay | Admitting: Family Medicine

## 2022-11-17 ENCOUNTER — Other Ambulatory Visit: Payer: Self-pay

## 2022-11-17 ENCOUNTER — Encounter: Payer: Self-pay | Admitting: Obstetrics and Gynecology

## 2022-11-17 ENCOUNTER — Other Ambulatory Visit (HOSPITAL_COMMUNITY)
Admission: RE | Admit: 2022-11-17 | Discharge: 2022-11-17 | Disposition: A | Discharge status: Home / Self Care | Payer: Medicaid Other | Source: Ambulatory Visit | Attending: Obstetrics and Gynecology | Admitting: Obstetrics and Gynecology

## 2022-11-17 ENCOUNTER — Ambulatory Visit (INDEPENDENT_AMBULATORY_CARE_PROVIDER_SITE_OTHER): Payer: Medicaid Other | Admitting: Family Medicine

## 2022-11-17 VITALS — BP 98/55 | HR 140 | Wt 257.5 lb

## 2022-11-17 DIAGNOSIS — N898 Other specified noninflammatory disorders of vagina: Secondary | ICD-10-CM

## 2022-11-17 DIAGNOSIS — O99213 Obesity complicating pregnancy, third trimester: Secondary | ICD-10-CM

## 2022-11-17 DIAGNOSIS — L299 Pruritus, unspecified: Secondary | ICD-10-CM

## 2022-11-17 DIAGNOSIS — A609 Anogenital herpesviral infection, unspecified: Secondary | ICD-10-CM

## 2022-11-17 DIAGNOSIS — F332 Major depressive disorder, recurrent severe without psychotic features: Secondary | ICD-10-CM

## 2022-11-17 DIAGNOSIS — Z3A32 32 weeks gestation of pregnancy: Secondary | ICD-10-CM

## 2022-11-17 DIAGNOSIS — O24113 Pre-existing diabetes mellitus, type 2, in pregnancy, third trimester: Secondary | ICD-10-CM

## 2022-11-17 DIAGNOSIS — O0993 Supervision of high risk pregnancy, unspecified, third trimester: Secondary | ICD-10-CM

## 2022-11-17 MED ORDER — FLUCONAZOLE 150 MG PO TABS: 150.0000 mg | ORAL_TABLET | Freq: Once | ORAL | 3 refills | Status: AC

## 2022-11-17 MED ORDER — OLANZAPINE 2.5 MG PO TABS: 2.5000 mg | ORAL_TABLET | Freq: Every day | ORAL | 11 refills | Status: DC

## 2022-11-17 MED ORDER — VALACYCLOVIR HCL 500 MG PO TABS: 500.0000 mg | ORAL_TABLET | Freq: Two times a day (BID) | ORAL | 6 refills | Status: DC

## 2022-11-17 MED ORDER — SERTRALINE HCL 50 MG PO TABS: 50.0000 mg | ORAL_TABLET | Freq: Every day | ORAL | 3 refills | Status: DC

## 2022-11-17 NOTE — Progress Notes (Signed)
PRENATAL VISIT NOTE  Subjective:  Courtney Holloway is a 21 y.o. G1P0 at 44w1dbeing seen today for ongoing prenatal care.  She is currently monitored for the following issues for this high-risk pregnancy and has GAD (generalized anxiety disorder); Asthma; Slow transit constipation; Hidradenitis axillaris; Dyslipidemia; Post-traumatic stress disorder; MDD (major depressive disorder), recurrent severe, without psychosis ([place]; Attention deficit hyperactivity disorder (ADHD), combined type; Drug overdose, multiple drugs, undetermined intent, initial encounter; Anal fissure; Nightmares associated with chronic post-traumatic stress disorder; Prediabetes; Body mass index (BMI) of 39.0 to 39.9 in adult; Elevated blood pressure reading without diagnosis of hypertension; Supervision of high-risk pregnancy; Maternal morbid obesity, antepartum (HTown[place]; Diabetes mellitus during pregnancy in third trimester; GBS bacteriuria; Suicidal ideation; and HSV (herpes simplex virus) anogenital infection on their problem list.  Patient reports multiple complaints-- see A/P  Contractions: Irritability.  .  Movement: Present. Denies leaking of fluid.   The following portions of the patient's history were reviewed and updated as appropriate: allergies, current medications, past family history, past medical history, past social history, past surgical history and problem list.   Objective:   Vitals:   11/17/22 1124  BP: (!) 98/55  Pulse: (!) 140  Weight: 257 lb 8 oz (116.8 kg)    Fetal Status: Fetal Heart Rate (bpm): 157   Movement: Present     General:  Alert, oriented and cooperative. Patient is in no acute distress.  Skin: Skin is warm and dry. No rash noted.   Cardiovascular: Normal heart rate noted  Respiratory: Normal respiratory effort, no problems with respiration noted  Abdomen: Soft, gravid, appropriate for gestational age.  Pain/Pressure: Absent     Pelvic: Cervical exam deferred      + Rought/purple  raised areas on labia and inner thigh. Copious white/yellow discharge  Extremities: Normal range of motion.     Mental Status: Normal mood and affect. Normal behavior. Normal judgment and thought content.   Breast: Chaperone present- TTP on the upper outer edge of right breast. No mass or abnormality. No skin changes.   Assessment and Plan:  Pregnancy: G1P0 at 361w1d1. Maternal morbid obesity, antepartum (HCC) TWG=37 lb 8 oz (17 kg) which is very much above goal  2. Pre-existing type 2 diabetes mellitus during pregnancy in third trimester Patient is on Lantus daily, no mealtime coverage. She is not bring her log today and is "not good at checking sugars." She reports fastings in the 95-100 range and PP can be 90s or sometimes 170s depending on foods.  Does not check her sugars because "that is hard to do 4 times per day." She used to have Dexcom but her "insurance wont cover it" and asked patient her insurance type "I dont know medicaid"   - Hemoglobin A1c - last growth scan showed EFW 61st% - Has not kept appts with DM education-- will message about the CGM  3. Supervision of high risk pregnancy in third trimester Lump on breast- unable to feel lump. Recommended supportive bra and topical arnica    Rash- located between thighs and on vulva- appears likely candida and had white creamy discharge. - fluconazole (DIFLUCAN) 150 MG tablet; Take 1 tablet (150 mg total) by mouth once for 1 dose. Can take additional dose three days later if symptoms persist  Dispense: 1 tablet; Refill: 3  Had baby shower [facility]. MDD (major depressive disorder), recurrent severe, without psychosis (HCGreen CampPatient [facility], declined follow up with [facility] and signed herself out after 72 hrs She has continued zoloft since discharge.  She reports she does have a counselor- [facility]- located off [address] -  [email or web address]. She has not seen them in months, does not know the name of her counseling "some african woman" Currently taking zoloft "low dose and I am not sure what but they gave me a bottle and told me to take it."  Agrees to continue Zoloft 38m, sent to [facility] deliver to her residence and patient had transportation issues. Discussed starting Zyprexa as the patient reports trouble sleeping and needs something Open to Jamie/IBH-agrees to schedule at front desk  - OLANZapine (ZYPREXA) 2.5 MG tablet; Take 1 tablet (2.5 mg total) by mouth at bedtime.  Dispense: 30 tablet; Refill: 11 - sertraline (ZOLOFT) 50 MG tablet; Take 1 tablet (50 mg total) by mouth daily.  Dispense: 30 tablet; Refill: 3  5. Itching Could be cholestasis Will get labs today - Comprehensive metabolic panel - Bile acids, total  6. Vaginal discharge - Cervicovaginal ancillary only( [place]  7. HSV (herpes simplex virus) anogenital infection - valACYclovir (VALTREX) 500 MG tablet; Take 1 tablet (500 mg total) by mouth 2 (two) times daily.  Dispense: 60 tablet; Refill: 6  Preterm labor symptoms and general obstetric precautions including but not limited to vaginal bleeding, contractions, leaking of fluid and fetal movement were reviewed in detail with the patient. Please refer to After Visit Summary for other counseling recommendations.   Return in about 2 weeks (around 12/01/2022) for Routine prenatal care. Dpes prefer not to see Dr. ERip Harbour  Future Appointments  Date Time Provider [place]ug[place] 11/19/2022 10:30 AM [facility] NURSE [facility] 11/19/2022 10:45 AM [facility] US5 [facility] 11/26/2022 12:30 PM [facility] NURSE [facility] 11/26/2022 12:45 PM WMC-MFC US4 [facility] 11/27/2022  8:45 AM WMC-BEHAVIORAL HEALTH CLINICIAN [facility]CWH [facility] 12/01/2022  1:55 PM EChancy Milroy MD [facility] 12/04/2022 12:30 PM WMC[facility]MFC NURSE [facility] 12/04/2022 12:45 PM  WMC-MFC US4 [facility] 12/10/2022  1:15 PM WMC[facility]MFC NURSE [facility] 12/10/2022  1:30 PM WMC-MFC US2 [facility]US [facility] 12/16/2022  1:15 PM BGriffin Basil MD [facility] 12/24/2022  1:15 PM PAletha Halim MD [facility] 12/31/2022  1:15 PM BGriffin Basil MD [facility] 01/07/2023  1:15 PM AWoodroe Mode MD [facility] 01/14/2023  3:15 PM WMC-WOCA NST [facility] 01/14/2023  4:15 PM BGriffin Basil MD [facility]   KCaren Macadam MD

## 2022-11-18 LAB — CERVICOVAGINAL ANCILLARY ONLY
Bacterial Vaginitis (gardnerella): NEGATIVE
Candida Glabrata: NEGATIVE
Candida Vaginitis: POSITIVE — AB
Chlamydia: NEGATIVE
Comment: NEGATIVE
Comment: NEGATIVE
Comment: NEGATIVE
Comment: NEGATIVE
Comment: NEGATIVE
Comment: NORMAL
Neisseria Gonorrhea: NEGATIVE
Trichomonas: NEGATIVE

## 2022-11-18 NOTE — BH Specialist Note (Signed)
Pt did not arrive to video visit and did not answer the phone; Left HIPPA-compliant message to call back Mirabella Hilario [facility] at  [phone] (Jadier Rockers's office).  ?; left MyChart message for patient.  ? ?

## 2022-11-19 ENCOUNTER — Encounter: Payer: Self-pay | Admitting: Registered"

## 2022-11-19 ENCOUNTER — Other Ambulatory Visit: Payer: Self-pay | Admitting: Maternal & Fetal Medicine

## 2022-11-19 ENCOUNTER — Encounter: Payer: Self-pay | Admitting: Family Medicine

## 2022-11-19 ENCOUNTER — Encounter: Payer: Medicaid Other | Attending: Obstetrics & Gynecology | Admitting: Registered"

## 2022-11-19 ENCOUNTER — Ambulatory Visit: Payer: Medicaid Other | Attending: Maternal & Fetal Medicine

## 2022-11-19 ENCOUNTER — Ambulatory Visit (HOSPITAL_BASED_OUTPATIENT_CLINIC_OR_DEPARTMENT_OTHER): Payer: Medicaid Other | Admitting: Maternal & Fetal Medicine

## 2022-11-19 ENCOUNTER — Ambulatory Visit: Payer: Medicaid Other | Admitting: *Deleted

## 2022-11-19 VITALS — BP 110/58 | HR 126

## 2022-11-19 DIAGNOSIS — O24113 Pre-existing diabetes mellitus, type 2, in pregnancy, third trimester: Secondary | ICD-10-CM | POA: Diagnosis present

## 2022-11-19 DIAGNOSIS — O99210 Obesity complicating pregnancy, unspecified trimester: Secondary | ICD-10-CM | POA: Insufficient documentation

## 2022-11-19 DIAGNOSIS — O99212 Obesity complicating pregnancy, second trimester: Secondary | ICD-10-CM | POA: Diagnosis not present

## 2022-11-19 DIAGNOSIS — O24410 Gestational diabetes mellitus in pregnancy, diet controlled: Secondary | ICD-10-CM | POA: Diagnosis not present

## 2022-11-19 DIAGNOSIS — O0993 Supervision of high risk pregnancy, unspecified, third trimester: Secondary | ICD-10-CM | POA: Diagnosis present

## 2022-11-19 DIAGNOSIS — Z3A32 32 weeks gestation of pregnancy: Secondary | ICD-10-CM

## 2022-11-19 DIAGNOSIS — R7303 Prediabetes: Secondary | ICD-10-CM | POA: Insufficient documentation

## 2022-11-19 DIAGNOSIS — E669 Obesity, unspecified: Secondary | ICD-10-CM | POA: Diagnosis not present

## 2022-11-19 DIAGNOSIS — Z362 Encounter for other antenatal screening follow-up: Secondary | ICD-10-CM

## 2022-11-19 DIAGNOSIS — O99513 Diseases of the respiratory system complicating pregnancy, third trimester: Secondary | ICD-10-CM

## 2022-11-19 DIAGNOSIS — O99213 Obesity complicating pregnancy, third trimester: Secondary | ICD-10-CM

## 2022-11-19 DIAGNOSIS — J45909 Unspecified asthma, uncomplicated: Secondary | ICD-10-CM

## 2022-11-19 DIAGNOSIS — O24414 Gestational diabetes mellitus in pregnancy, insulin controlled: Secondary | ICD-10-CM | POA: Diagnosis not present

## 2022-11-19 LAB — COMPREHENSIVE METABOLIC PANEL
ALT: 6 IU/L (ref 0–32)
AST: 8 IU/L (ref 0–40)
Albumin/Globulin Ratio: 1.3 (ref 1.2–2.2)
Albumin: 3.5 g/dL — ABNORMAL LOW (ref 4.0–5.0)
Alkaline Phosphatase: 94 IU/L (ref 42–106)
BUN/Creatinine Ratio: 9 (ref 9–23)
BUN: 5 mg/dL — ABNORMAL LOW (ref 6–20)
Bilirubin Total: <0.2 mg/dL (ref 0.0–1.2)
CO2: 18 mmol/L — ABNORMAL LOW (ref 20–29)
Calcium: 9.2 mg/dL (ref 8.7–10.2)
Chloride: 104 mmol/L (ref 96–106)
Creatinine, Ser: 0.55 mg/dL — ABNORMAL LOW (ref 0.57–1.00)
Globulin, Total: 2.7 g/dL (ref 1.5–4.5)
Glucose: 126 mg/dL — ABNORMAL HIGH (ref 70–99)
Potassium: 4.1 mmol/L (ref 3.5–5.2)
Sodium: 138 mmol/L (ref 134–144)
Total Protein: 6.2 g/dL (ref 6.0–8.5)
eGFR: 135 mL/min/{1.73_m2} (ref >59)

## 2022-11-19 LAB — HEMOGLOBIN A1C
Est. average glucose Bld gHb Est-mCnc: 134 mg/dL
Hgb A1c MFr Bld: 6.3 % — ABNORMAL HIGH (ref 4.8–5.6)

## 2022-11-19 LAB — BILE ACIDS, TOTAL: Bile Acids Total: 5.2 umol/L (ref 0.0–10.0)

## 2022-11-19 NOTE — Progress Notes (Signed)
MFM Consult Note Patient Name: Courtney Holloway [facility]  Patient MRN:   [id number]  Referring provider: [facility] Reason for Consult: poor glycemic control, gDMA2   HPI: Courtney Holloway is a 21 y.o. G1P0 at [redacted]w[redacted]d here for a BPP.   The patient is here for a BPP for gDMA2. She is at 32w 3d with EDD of 01/11/2023 dated by Early Ultrasound  (05/26/22). Other pregnancy complications include teen pregnancy and obesity.   Courtney Holloway has a continuous glucose monitor and reports poor control. The fasting is between 85 to 120s and is usually over 95. Her 2h postprandial values are in the 140s to 180s and some over 200. She is on Lantus 10U QHS. I discussed the potential problems with poor glucose control in pregnancy and instructed her to increase her Lantus to 16U QHS and then by 2U every day her blood sugars are not well controlled. I also told her to call her provider and schedule a visit to discuss her blood sugars.   Sonographic findings Single intrauterine pregnancy. Observed fetal cardiac activity. Cephalic presentation. Interval fetal anatomy appears normal.  Amniotic fluid volume: Within normal limits. AFI: 20.3 cm.  MVP: 7.7 cm. Placenta is posterior. BPP is 8/8.   Review of Systems: A review of systems was performed and was negative except per HPI   Vitals and Physical Exam See intake sheet for vitals Sitting comfortably on the sonogram table Nonlabored breathing Normal rate and rhythm Abdomen is non-tender  Recommendations 1. BBPs weekly until delivery 2. Growth ultrasounds every 4 weeks until delivery  3. Delivery around 37-[redacted] weeks gestation pending her glucose control.  4. Increase Lantus from 10U QHS to 16U QHS and then by 2U every day her blood sugars are not well controlled. Hypoglycemic precautions were given.   I spent 30 minutes reviewing the patients chart, including labs and images as well as counseling the patient about her medical conditions.  Braxton Feathers  MFM, [facility]    11/19/2022  11:37 AM

## 2022-11-19 NOTE — Progress Notes (Signed)
Patient with T2D in pregnancy was seen by Ob provider yesterday and was referred for CGM start in order to determine insulin needs.   EDD: 01//28/24; [redacted]w[redacted]d  Dexcom readings from Nov 13-16, 2023 were 74-276 mg/dL  Because patient's insurance will not cover Dexcom G6 sensors, pt has not continued to use the sample CGM started in Nov.  Pt was started on a G7 sample today to get enough recent data to determine current insulin needs. When multiple daily insulin injections is noted in her chart, Dexcom G6 sensors should be able to be obtained through her pharmacy.  Pt states while she was admitted in [facility] hospital for 6 days she was given a limited diet with no juice and 2 units of insulin for meals. Pt states she was provided with enough long acting insulin to get her through the rest of her pregnancy.  Pt reports her next Ob follow-up is Dec 18. Pt was instructed to upload her log sheet to MyChart within 5 days and [facility]ES will review and if necessary will alert Ob provider if insulin needs to be adjusted.   CGM Training - Dexcom G7 Lot # [id number] Exp# 2023-05-15  Start time:0915    End time: 0942 Total time: 27 min  [x]   Download Dexcom G7 app to phone [x]   Accept invite to share data through Clarity [x]   Getting to know device: Sensor:  2 hour warmup for new sensor/transmitter Waterproof Receiver: Phone vs reader. Turn on Location and bluetooth [x]   Setting up device (high alert 250 mg/dL, low alert 70 mg/dL) [x]   Setting alert profile: alarms are very loud. Also, not to panic if getting message that blood sugar is dropping []   Inserting sensor: 3" away from insulin pump - in line of site if using automated mode N/A [x]   After application it takes 30 min initial warm up before first reading.  [x]   Calibrating  []   Ending sensor session []   Lag time [x]   Trend arrows [x]   Going through security (don't put sensors in bags that go through xray) / MRI / CT scans [x]    Trouble shooting:   1000mg  acetaminophen every 6 hrs [x]   Tape guide:  []   Insulin dosing from CGM readings. N/A at this time  Patient has Dexcom tech support and my contact information.

## 2022-11-24 ENCOUNTER — Encounter: Payer: Self-pay | Admitting: Registered"

## 2022-11-24 ENCOUNTER — Encounter: Payer: Self-pay | Admitting: Family Medicine

## 2022-11-24 NOTE — Progress Notes (Unsigned)
Dr. Alvester Morin requested more information about her blood sugar readings before making changes to patient's insulin dosing.  Per medication list: Lantus 10 units qhs  11/19/22 MFM note: "instructed her to increase her Lantus to 16U QHS and then by 2U every day...and schedule visit with her provider to discuss blood sugar."   11/24/22 sent Mychart msg to patient asking how much insulin she is using.  Started Dexcom G7. [place]ens from Dec 6-11: Lows can occur mid-late morning 64-70s, then trends ~200 mg/dL from 8 pm-2 am.   Pt would likely benefit from using Omnipod 5 in automated mode. Adding meal bolus insulin could help address elevated readings while waiting for insulin pump Rx pre-authorization and training process.  Patient has had 2 video visits for nutrition/diabetes education visits. Pt would benefit from additional nutrition visits in-person.

## 2022-11-25 ENCOUNTER — Encounter: Payer: Medicaid Other | Admitting: Family Medicine

## 2022-11-25 ENCOUNTER — Telehealth: Payer: Medicaid Other

## 2022-11-26 ENCOUNTER — Ambulatory Visit: Payer: Medicaid Other | Attending: Obstetrics

## 2022-11-26 ENCOUNTER — Ambulatory Visit: Payer: Medicaid Other | Admitting: *Deleted

## 2022-11-26 VITALS — BP 113/77 | HR 118

## 2022-11-26 DIAGNOSIS — O99213 Obesity complicating pregnancy, third trimester: Secondary | ICD-10-CM | POA: Diagnosis not present

## 2022-11-26 DIAGNOSIS — O99513 Diseases of the respiratory system complicating pregnancy, third trimester: Secondary | ICD-10-CM

## 2022-11-26 DIAGNOSIS — O24419 Gestational diabetes mellitus in pregnancy, unspecified control: Secondary | ICD-10-CM | POA: Insufficient documentation

## 2022-11-26 DIAGNOSIS — O0993 Supervision of high risk pregnancy, unspecified, third trimester: Secondary | ICD-10-CM | POA: Diagnosis present

## 2022-11-26 DIAGNOSIS — O24414 Gestational diabetes mellitus in pregnancy, insulin controlled: Secondary | ICD-10-CM | POA: Diagnosis not present

## 2022-11-26 DIAGNOSIS — E669 Obesity, unspecified: Secondary | ICD-10-CM

## 2022-11-26 DIAGNOSIS — O99210 Obesity complicating pregnancy, unspecified trimester: Secondary | ICD-10-CM | POA: Diagnosis present

## 2022-11-26 DIAGNOSIS — J45909 Unspecified asthma, uncomplicated: Secondary | ICD-10-CM

## 2022-11-26 DIAGNOSIS — Z3A33 33 weeks gestation of pregnancy: Secondary | ICD-10-CM

## 2022-11-27 ENCOUNTER — Ambulatory Visit: Payer: Medicaid Other | Admitting: Clinical

## 2022-11-27 DIAGNOSIS — Z91199 Patient's noncompliance with other medical treatment and regimen due to unspecified reason: Secondary | ICD-10-CM

## 2022-11-28 ENCOUNTER — Ambulatory Visit: Payer: Medicaid Other

## 2022-11-28 ENCOUNTER — Ambulatory Visit: Payer: Medicaid Other | Attending: Obstetrics and Gynecology

## 2022-12-01 ENCOUNTER — Ambulatory Visit (HOSPITAL_BASED_OUTPATIENT_CLINIC_OR_DEPARTMENT_OTHER): Payer: Medicaid Other | Admitting: Obstetrics

## 2022-12-01 ENCOUNTER — Encounter: Payer: Self-pay | Admitting: Obstetrics and Gynecology

## 2022-12-01 ENCOUNTER — Ambulatory Visit: Payer: Medicaid Other | Attending: Obstetrics and Gynecology | Admitting: *Deleted

## 2022-12-01 ENCOUNTER — Encounter: Payer: Self-pay | Admitting: Family Medicine

## 2022-12-01 ENCOUNTER — Ambulatory Visit: Payer: Medicaid Other | Admitting: *Deleted

## 2022-12-01 VITALS — BP 129/70 | HR 124

## 2022-12-01 DIAGNOSIS — O99213 Obesity complicating pregnancy, third trimester: Secondary | ICD-10-CM

## 2022-12-01 DIAGNOSIS — Z3A34 34 weeks gestation of pregnancy: Secondary | ICD-10-CM | POA: Diagnosis not present

## 2022-12-01 DIAGNOSIS — O24414 Gestational diabetes mellitus in pregnancy, insulin controlled: Secondary | ICD-10-CM | POA: Diagnosis not present

## 2022-12-01 DIAGNOSIS — O0993 Supervision of high risk pregnancy, unspecified, third trimester: Secondary | ICD-10-CM

## 2022-12-01 DIAGNOSIS — O4703 False labor before 37 completed weeks of gestation, third trimester: Secondary | ICD-10-CM | POA: Diagnosis not present

## 2022-12-01 DIAGNOSIS — O99210 Obesity complicating pregnancy, unspecified trimester: Secondary | ICD-10-CM

## 2022-12-01 NOTE — Progress Notes (Signed)
MFM Note  Courtney Holloway was seen for an NST today due to uncontrolled gestational diabetes that is currently treated with Lantus insulin 10 units at bedtime.    A review of her blood glucose levels shows a fasting level in the low 100s range and her 2-hour postprandial values were all in the 190s to the low 200s range.    The patient had a reactive NST today.    Due to her extremely elevated blood glucose levels, she was told to increase her Lantus dosage to the following:   12 units at bedtime  14 units in the morning  Due to uncontrolled gestational diabetes, we will continue to follow her with twice weekly fetal testing until delivery.    Due to uncontrolled gestational diabetes, delivery should probably occur at around 37 weeks.

## 2022-12-01 NOTE — Procedures (Signed)
Courtney Holloway 2001-12-01 [redacted]w[redacted]d  Fetus A Non-Stress Test Interpretation for 12/01/22  Indication: Gestational Diabetes medication controlled  Fetal Heart Rate A Mode: External Baseline Rate (A): 145 bpm Variability: Moderate Accelerations: 15 x 15 Decelerations: None Multiple birth?: No  Uterine Activity Mode: Palpation, Toco Contraction Frequency (min): Irreg Contraction Duration (sec): 40-60 Contraction Quality: Mild Resting Tone Palpated: Relaxed Resting Time: Adequate  Interpretation (Fetal Testing) Nonstress Test Interpretation: Reactive Comments: Dr. Parke Poisson reviewed tracing.

## 2022-12-02 NOTE — BH Specialist Note (Signed)
Integrated Behavioral Health via Telemedicine Visit  12/16/2022 [name] [id number]  Number of Integrated Behavioral Health Clinician visits: 3- Third Visit  Session Start time: 1556   Session End time: 1618  Total time in minutes: 22   Referring Provider: Corlis Hove, NP Patient/Family location: Home [facility] Provider location: Center for [facility]  All persons participating in visit: Patient Courtney Holloway and [facility] Paytin Ramakrishnan   Types of Service: Individual psychotherapy and Telephone visit  I connected with Georgina Pillion and/or Claudie Fisherman [employer]'s  n/a  via  Telephone or Video Enabled Telemedicine Application  (Video is Caregility application) and verified that I am speaking with the correct person using two identifiers. Discussed confidentiality: Yes   I discussed the limitations of telemedicine and the availability of in person appointments.  Discussed there is a possibility of technology failure and discussed alternative modes of communication if that failure occurs.  I discussed that engaging in this telemedicine visit, they consent to the provision of behavioral healthcare and the services will be billed under their insurance.  Patient and/or legal guardian expressed understanding and consented to Telemedicine visit: Yes   Presenting Concerns: Patient and/or family reports the following symptoms/concerns: Fear of complications in childbirth; interested in intensive outpatient therapy postpartum. Pt does not currently have a PCP or pediatrician for baby, but considering return [facility].  Duration of problem: Ongoing; Severity of problem: moderate  Patient and/or Family's Strengths/Protective Factors: Social connections, Concrete supports in place (healthy food, safe environments, etc.), and Sense of purpose  Goals Addressed: Patient will:  Reduce symptoms of: anxiety and depression   Increase  knowledge and/or ability of: stress reduction   Demonstrate ability to: Increase motivation to adhere to plan of care  Progress towards Goals: Ongoing  Interventions: Interventions utilized:  Motivational Interviewing and Solution-Focused Strategies Standardized Assessments completed: Not Needed  Patient and/or Family Response: Patient agrees with treatment plan.   Assessment: Patient currently experiencing History of MDD, PTSD, ADHD, GAD.   Patient may benefit from continued therapeutic interventions .  Plan: Follow up with behavioral health clinician on : Postpartum mood check Behavioral recommendations:  -Pack hospital bag today; use checklist on After Visit Summary as a guide -Accept referral to [facility] for Outpatient therapy and BH medication management -Consider re-establishing care with previous PCP [facility]) for self and baby Referral(s): Integrated [employer] (In Clinic)  I discussed the assessment and treatment plan with the patient and/or parent/guardian. They were provided an opportunity to ask questions and all were answered. They agreed with the plan and demonstrated an understanding of the instructions.   They were advised to call back or seek an in-person evaluation if the symptoms worsen or if the condition fails to improve as anticipated.  Rae Lips, LCSW     10/06/2022   11:43 AM 08/04/2022    2:16 PM 07/02/2022    9:06 AM 01/13/2022   11:38 AM 11/14/2021    1:01 PM  Depression screen PHQ 2/9  Decreased Interest 1 1 3 2 2   Down, Depressed, Hopeless 1 1 3 3 2   PHQ - 2 Score 2 2 6 5 4   Altered sleeping 1 2 3 3 2   Tired, decreased energy 1 2 3 3 2   Change in appetite 1 2 3 3 2   Feeling bad or failure about yourself  1 2 3 3 2   Trouble concentrating 0 0 0 0 2  Moving slowly or fidgety/restless 0 0 0 0 0  Suicidal thoughts 0 0 0    PHQ-9 Score 6 10 18 17 14       10/06/2022   11:43 AM 08/04/2022    2:16 PM 07/02/2022     9:06 AM 01/13/2022   11:38 AM  GAD 7 : Generalized Anxiety Score  Nervous, Anxious, on Edge 1 2 2 2   Control/stop worrying 1 2 2 2   Worry too much - different things 1 2 3 2   Trouble relaxing 1 2 2 2   Restless 0 0 0 0  Easily annoyed or irritable 1 2 3 3   Afraid - awful might happen 1 2 2 3   Total GAD 7 Score 6 12 14  14

## 2022-12-02 NOTE — Progress Notes (Signed)
Attestation for Registered Dietician: I reviewed the note and documentation by the RD regarding this patient's glucose control. I agree with the documented assessment and plan. I was consulted and the documentation reflects this consult.   For this client I am very hesistant to increase her insulin dosing as she appears to have having hypoglycemia. We will try to [facility]ate [facility] since she has transportation issues. Agree with the directed nutritional counseling by Collie Siad to avoid hypoglycemia.  Federico Flake, MD Attending Physician Center for [facility]

## 2022-12-04 ENCOUNTER — Ambulatory Visit: Payer: Medicaid Other | Attending: Obstetrics

## 2022-12-04 ENCOUNTER — Ambulatory Visit: Payer: Medicaid Other | Admitting: *Deleted

## 2022-12-04 ENCOUNTER — Ambulatory Visit (INDEPENDENT_AMBULATORY_CARE_PROVIDER_SITE_OTHER): Payer: Medicaid Other | Admitting: Obstetrics and Gynecology

## 2022-12-04 VITALS — BP 123/70 | HR 123

## 2022-12-04 VITALS — BP 136/87 | Wt 262.4 lb

## 2022-12-04 DIAGNOSIS — O99891 Other specified diseases and conditions complicating pregnancy: Secondary | ICD-10-CM

## 2022-12-04 DIAGNOSIS — O24414 Gestational diabetes mellitus in pregnancy, insulin controlled: Secondary | ICD-10-CM

## 2022-12-04 DIAGNOSIS — O99213 Obesity complicating pregnancy, third trimester: Secondary | ICD-10-CM | POA: Diagnosis not present

## 2022-12-04 DIAGNOSIS — E669 Obesity, unspecified: Secondary | ICD-10-CM | POA: Diagnosis not present

## 2022-12-04 DIAGNOSIS — O99210 Obesity complicating pregnancy, unspecified trimester: Secondary | ICD-10-CM | POA: Insufficient documentation

## 2022-12-04 DIAGNOSIS — Z3A34 34 weeks gestation of pregnancy: Secondary | ICD-10-CM

## 2022-12-04 DIAGNOSIS — O0993 Supervision of high risk pregnancy, unspecified, third trimester: Secondary | ICD-10-CM | POA: Insufficient documentation

## 2022-12-04 DIAGNOSIS — Z6841 Body Mass Index (BMI) 40.0 and over, adult: Secondary | ICD-10-CM | POA: Insufficient documentation

## 2022-12-04 DIAGNOSIS — R8271 Bacteriuria: Secondary | ICD-10-CM

## 2022-12-04 DIAGNOSIS — R7303 Prediabetes: Secondary | ICD-10-CM

## 2022-12-04 DIAGNOSIS — O99513 Diseases of the respiratory system complicating pregnancy, third trimester: Secondary | ICD-10-CM

## 2022-12-04 DIAGNOSIS — J45909 Unspecified asthma, uncomplicated: Secondary | ICD-10-CM

## 2022-12-04 DIAGNOSIS — O24419 Gestational diabetes mellitus in pregnancy, unspecified control: Secondary | ICD-10-CM | POA: Diagnosis present

## 2022-12-04 DIAGNOSIS — O24113 Pre-existing diabetes mellitus, type 2, in pregnancy, third trimester: Secondary | ICD-10-CM

## 2022-12-04 DIAGNOSIS — Z87891 Personal history of nicotine dependence: Secondary | ICD-10-CM

## 2022-12-04 DIAGNOSIS — A609 Anogenital herpesviral infection, unspecified: Secondary | ICD-10-CM

## 2022-12-04 NOTE — Progress Notes (Signed)
Fbs: 80 PPBS: 175-200 16 lantus pm 14 lantus   PRENATAL VISIT NOTE  Subjective:  Courtney Holloway is a 21 y.o. G1P0 at [redacted]w[redacted]d being seen today for ongoing prenatal care.  She is currently monitored for the following issues for this high-risk pregnancy and has GAD (generalized anxiety disorder); Asthma; Slow transit constipation; Hidradenitis axillaris; Dyslipidemia; Post-traumatic stress disorder; MDD (major depressive disorder), recurrent severe, without psychosis (HCC); Attention deficit hyperactivity disorder (ADHD), combined type; Drug overdose, multiple drugs, undetermined intent, initial encounter; Anal fissure; Nightmares associated with chronic post-traumatic stress disorder; Prediabetes; Body mass index (BMI) of 39.0 to 39.9 in adult; Elevated blood pressure reading without diagnosis of hypertension; Supervision of high-risk pregnancy; Maternal morbid obesity, antepartum (HCC); Diabetes mellitus during pregnancy in third trimester; GBS bacteriuria; Suicidal ideation; HSV (herpes simplex virus) anogenital infection; and BMI 40.0-44.9, adult (HCC) on their problem list.  Patient doing well with no acute concerns today. She reports  lower pelvic pressure and back pain .  Contractions: Irregular.  .  Movement: Present. Denies leaking of fluid.   The following portions of the patient's history were reviewed and updated as appropriate: allergies, current medications, past family history, past medical history, past social history, past surgical history and problem list. Problem list updated.  Objective:   Vitals:   12/04/22 1207 12/04/22 1220  BP: (!) 141/88 136/87  Weight: 262 lb 6.4 oz (119 kg)     Fetal Status: Fetal Heart Rate (bpm): 138 Fundal Height: 36 cm Movement: Present     General:  Alert, oriented and cooperative. Patient is in no acute distress.  Skin: Skin is warm and dry. No rash noted.   Cardiovascular: Normal heart rate noted  Respiratory: Normal respiratory effort, no  problems with respiration noted  Abdomen: Soft, gravid, appropriate for gestational age.  Pain/Pressure: Present     Pelvic: Cervical exam deferred        Extremities: Normal range of motion.     Mental Status:  Normal mood and affect. Normal behavior. Normal judgment and thought content.   Assessment and Plan:  Pregnancy: G1P0 at [redacted]w[redacted]d  1. [redacted] weeks gestation of pregnancy   2. Pre-existing type 2 diabetes mellitus during pregnancy in third trimester FBS: 80 PPBS: 175-200 Pt not checking blood sugar consistently since dexcom failed continue lantus 16 units at night increase AM lantus to 14 units. Would recommned delivery at 37 weeks due to poor control, schedule at next visit  3. Supervision of high risk pregnancy in third trimester Continue routine prenatal care  4. HSV (herpes simplex virus) anogenital infection If not already on prophylaxis, start at next visit  5. Maternal morbid obesity, antepartum (HCC)   6. GBS bacteriuria Treat in labor  7. BMI 40.0-44.9, adult (HCC)   Preterm labor symptoms and general obstetric precautions including but not limited to vaginal bleeding, contractions, leaking of fluid and fetal movement were reviewed in detail with the patient.  Please refer to After Visit Summary for other counseling recommendations.   Return in about 1 week (around 12/11/2022) for 36 weeks swabs.   Mariel Aloe, MD Faculty Attending Center for [facility]

## 2022-12-10 ENCOUNTER — Inpatient Hospital Stay (HOSPITAL_COMMUNITY)
Admission: AD | Admit: 2022-12-10 | Discharge: 2022-12-10 | Disposition: A | Discharge status: Home / Self Care | Payer: Medicaid Other | Attending: Obstetrics and Gynecology | Admitting: Obstetrics and Gynecology

## 2022-12-10 ENCOUNTER — Encounter (HOSPITAL_COMMUNITY): Payer: Self-pay | Admitting: Obstetrics and Gynecology

## 2022-12-10 ENCOUNTER — Ambulatory Visit: Payer: Medicaid Other

## 2022-12-10 ENCOUNTER — Other Ambulatory Visit: Payer: Self-pay | Admitting: Obstetrics and Gynecology

## 2022-12-10 ENCOUNTER — Inpatient Hospital Stay (HOSPITAL_BASED_OUTPATIENT_CLINIC_OR_DEPARTMENT_OTHER): Payer: Medicaid Other

## 2022-12-10 ENCOUNTER — Encounter: Payer: Self-pay | Admitting: Family Medicine

## 2022-12-10 ENCOUNTER — Telehealth: Payer: Self-pay | Admitting: Student

## 2022-12-10 DIAGNOSIS — O98513 Other viral diseases complicating pregnancy, third trimester: Secondary | ICD-10-CM | POA: Diagnosis not present

## 2022-12-10 DIAGNOSIS — Z3689 Encounter for other specified antenatal screening: Secondary | ICD-10-CM | POA: Diagnosis not present

## 2022-12-10 DIAGNOSIS — O368130 Decreased fetal movements, third trimester, not applicable or unspecified: Secondary | ICD-10-CM | POA: Diagnosis present

## 2022-12-10 DIAGNOSIS — Z3A35 35 weeks gestation of pregnancy: Secondary | ICD-10-CM

## 2022-12-10 DIAGNOSIS — O219 Vomiting of pregnancy, unspecified: Secondary | ICD-10-CM

## 2022-12-10 DIAGNOSIS — U071 COVID-19: Secondary | ICD-10-CM | POA: Diagnosis not present

## 2022-12-10 LAB — URINALYSIS, ROUTINE W REFLEX MICROSCOPIC
Bilirubin Urine: NEGATIVE
Glucose, UA: NEGATIVE mg/dL
Hgb urine dipstick: NEGATIVE
Ketones, ur: NEGATIVE mg/dL
Nitrite: NEGATIVE
Protein, ur: 30 mg/dL — AB
Specific Gravity, Urine: 1.013 (ref 1.005–1.030)
pH: 6 (ref 5.0–8.0)

## 2022-12-10 LAB — RESP PANEL BY RT-PCR (RSV, FLU A&B, COVID)  RVPGX2
Influenza A by PCR: NEGATIVE
Influenza B by PCR: NEGATIVE
Resp Syncytial Virus by PCR: NEGATIVE
SARS Coronavirus 2 by RT PCR: POSITIVE — AB

## 2022-12-10 MED ORDER — PAXLOVID (300/100) 20 X 150 MG & 10 X 100MG PO TBPK: 3.0000 | ORAL_TABLET | Freq: Two times a day (BID) | ORAL | 0 refills | Status: AC

## 2022-12-10 MED ORDER — GUAIFENESIN ER 600 MG PO TB12: 600.0000 mg | ORAL_TABLET | Freq: Two times a day (BID) | ORAL | 0 refills | Status: DC

## 2022-12-10 MED ORDER — PROMETHAZINE HCL 25 MG PO TABS: 25.0000 mg | ORAL_TABLET | Freq: Four times a day (QID) | ORAL | 0 refills | Status: DC | PRN

## 2022-12-10 MED ORDER — ACETAMINOPHEN 500 MG PO TABS: 1000.0000 mg | ORAL_TABLET | Freq: Four times a day (QID) | ORAL | 0 refills | Status: DC | PRN

## 2022-12-10 MED ORDER — PROMETHAZINE HCL 25 MG PO TABS
25.0000 mg | ORAL_TABLET | Freq: Four times a day (QID) | ORAL | Status: DC | PRN
  Administered 2022-12-10: 25 mg via ORAL
  Filled 2022-12-10: qty 1

## 2022-12-10 MED ORDER — PAXLOVID (300/100) 20 X 150 MG & 10 X 100MG PO TBPK: 1.0000 | ORAL_TABLET | Freq: Two times a day (BID) | ORAL | 0 refills | Status: DC

## 2022-12-10 NOTE — MAU Provider Note (Signed)
History     CSN: [id number]  Arrival date and time: 12/10/22 8144   Event Date/Time   First Provider Initiated Contact with Patient 12/10/22 [phone]      Chief Complaint  Patient presents with   Decreased Fetal Movement   Sore Throat   Nasal Congestion   21 y.o. G1 _0 .3 wks presenting with congestion, sore throat, cough, body aches, and leg swelling. Sx started 2 days ago. Denies sick contacts. Also reports decreased FM x 2 days.     OB History     Gravida  1   Para      Term      Preterm      AB      Living         SAB      IAB      Ectopic      Multiple      Live Births              Past Medical History:  Diagnosis Date   Anxiety    Asthma    Depression    Diabetes mellitus during pregnancy in second trimester 09/15/2022   DMDD (disruptive mood dysregulation disorder) ([place]) 12/28/2015   Failed vision screen 11/21/2014   GAD (generalized anxiety disorder)    Headache(784.0)    HSV-1 (herpes simplex virus 1) infection 08/02/2020   Obesity 11/21/2014   Vision abnormalities    wears glasses    Past Surgical History:  Procedure Laterality Date   NO PAST SURGERIES      Family History  Problem Relation Age of Onset   Diabetes Mother    Bipolar disorder Mother    Schizophrenia Mother    Multiple sclerosis Mother    Diabetes Father    Bipolar disorder Maternal Grandmother    Alcohol abuse Maternal Grandmother     Social History   Tobacco Use   Smoking status: Former    Types: E-cigarettes    Quit date: 04/13/2022    Years since quitting: 0.6   Smokeless tobacco: Never  Vaping Use   Vaping Use: Former  Substance Use Topics   Alcohol use: Not Currently   Drug use: Not Currently    Types: Marijuana    Comment: 2021    Allergies:  Allergies  Allergen Reactions   Other Anaphylaxis and Itching    Seasonal allergies, dog fur, certain detergents  Pt. Reports mother has used "epi-pen" for past allergies.    Pineapple Anaphylaxis    Fish-Derived Products Diarrhea, Hives and Itching   Zofran [Ondansetron Hcl] Nausea And Vomiting    Medications Prior to Admission  Medication Sig Dispense Refill Last Dose   insulin glargine (LANTUS) 100 UNIT/ML Solostar Pen Inject 10 Units into the skin at bedtime. (Patient taking differently: Inject 14 Units into the skin at bedtime. 16 u at night) 15 mL 11 Past Week   Prenatal Vit-Fe Fumarate-FA (PREPLUS) 27-1 MG TABS Take 1 tablet by mouth daily. 30 tablet 13 Past Week   sertraline (ZOLOFT) 50 MG tablet Take 1 tablet (50 mg total) by mouth daily. 30 tablet 3 Past Week   Accu-Chek Softclix Lancets lancets Use as instructed (Patient not taking: Reported on 10/22/2022) 100 each 12    ASPIRIN 81 PO Take 1 tablet by mouth daily. (Patient not taking: Reported on 10/22/2022)      Blood Glucose Monitoring Suppl (ACCU-CHEK GUIDE) w/Device KIT 1 Device by Does not apply route 4 (four) times daily. (Patient not taking: Reported  on 10/22/2022) 1 kit 0    Continuous Blood Gluc Sensor (DEXCOM G6 SENSOR) MISC Apply 1 sensor every 10 days. (Patient not taking: Reported on 12/01/2022) 3 each 11    Continuous Blood Gluc Transmit (DEXCOM G6 TRANSMITTER) MISC New transmitter needed every 3 months. (Patient not taking: Reported on 12/01/2022) 1 each 3    cyclobenzaprine (FLEXERIL) 10 MG tablet Take 1 tablet (10 mg total) by mouth 3 (three) times daily as needed for muscle spasms (or headache). (Patient not taking: Reported on 11/26/2022) 30 tablet 0    EPINEPHrine (EPIPEN 2-PAK) 0.3 mg/0.3 mL IJ SOAJ injection Inject 0.3 mg into the muscle as needed for anaphylaxis. (Patient not taking: Reported on 10/22/2022) 1 each 1    glucose blood (ACCU-CHEK GUIDE) test strip Use to check blood sugars four times a day was instructed (Patient not taking: Reported on 10/22/2022) 50 each 12    Insulin Pen Needle (PEN NEEDLES 3/16") 31G X 5 MM MISC Use with insulin pen daily @ bedtime (Patient not taking: Reported on 10/22/2022) 100  each 2    OLANZapine (ZYPREXA) 2.5 MG tablet Take 1 tablet (2.5 mg total) by mouth at bedtime. (Patient not taking: Reported on 12/01/2022) 30 tablet 11    valACYclovir (VALTREX) 500 MG tablet Take 1 tablet (500 mg total) by mouth 2 (two) times daily. (Patient not taking: Reported on 12/01/2022) 60 tablet 6    [DISCONTINUED] acetaminophen (TYLENOL) 500 MG tablet Take 500 mg by mouth every 6 (six) hours as needed. (Patient not taking: Reported on 10/22/2022)      [DISCONTINUED] promethazine (PHENERGAN) 25 MG tablet Take 1 tablet (25 mg total) by mouth every 6 (six) hours as needed for nausea or vomiting. (Patient not taking: Reported on 12/01/2022) 30 tablet 0     Review of Systems  Constitutional:  Negative for chills and fever.  HENT:  Positive for congestion, ear pain and sore throat.   Respiratory:  Positive for cough. Negative for shortness of breath.   Cardiovascular:  Positive for leg swelling. Negative for chest pain.  Gastrointestinal:  Negative for abdominal pain.  Genitourinary:  Negative for vaginal bleeding.  Musculoskeletal:  Negative for arthralgias and myalgias.   Physical Exam   Blood pressure 108/64, pulse (!) 111, temperature 98.1 F (36.7 C), resp. rate 20, height _0  (1.549 m), weight 122 kg, last menstrual period 04/01/2022, SpO2 98 %.  Physical Exam Vitals and nursing note reviewed.  Constitutional:      General: She is not in acute distress.    Appearance: Normal appearance.  HENT:     Head: Normocephalic and atraumatic.  Cardiovascular:     Rate and Rhythm: Normal rate and regular rhythm.     Heart sounds: Normal heart sounds.  Pulmonary:     Effort: Pulmonary effort is normal. No respiratory distress.     Breath sounds: Normal breath sounds. No stridor. No wheezing, rhonchi or rales.  Musculoskeletal:        General: Normal range of motion.     Cervical back: Normal range of motion.  Skin:    General: Skin is warm and dry.  Neurological:     General:  No focal deficit present.     Mental Status: She is alert and oriented to person, place, and time.  Psychiatric:        Mood and Affect: Mood normal.        Behavior: Behavior normal.   EFM: 145 bpm, mod variability, + accels, no decels  Toco: rare  Results for orders placed or performed during the hospital encounter of 12/10/22 (from the past 24 hour(s))  Resp panel by RT-PCR (RSV, Flu A&B, Covid) Anterior Nasal Swab     Status: Abnormal   Collection Time: 12/10/22  5:48 AM   Specimen: Anterior Nasal Swab  Result Value Ref Range   SARS Coronavirus 2 by RT PCR POSITIVE (A) NEGATIVE   Influenza A by PCR NEGATIVE NEGATIVE   Influenza B by PCR NEGATIVE NEGATIVE   Resp Syncytial Virus by PCR NEGATIVE NEGATIVE  Urinalysis, Routine w reflex microscopic Urine, Clean Catch     Status: Abnormal   Collection Time: 12/10/22  6:25 AM  Result Value Ref Range   Color, Urine YELLOW YELLOW   APPearance CLOUDY (A) CLEAR   Specific Gravity, Urine 1.013 1.005 - 1.030   pH 6.0 5.0 - 8.0   Glucose, UA NEGATIVE NEGATIVE mg/dL   Hgb urine dipstick NEGATIVE NEGATIVE   Bilirubin Urine NEGATIVE NEGATIVE   Ketones, ur NEGATIVE NEGATIVE mg/dL   Protein, ur 30 (A) NEGATIVE mg/dL   Nitrite NEGATIVE NEGATIVE   Leukocytes,Ua LARGE (A) NEGATIVE   RBC / HPF 0-5 0 - 5 RBC/hpf   WBC, UA 21-50 0 - 5 WBC/hpf   Bacteria, UA FEW (A) NONE SEEN   Squamous Epithelial / LPF 11-20 0 - 5 /HPF   Mucus PRESENT    Budding Yeast PRESENT    MAU Course  Procedures  MDM Labs and BPP ordered. BPP 8/8, AFI 12. +Covid, pt informed. Rec quarantine x5 days. Offered Paxlovid and pt accepts. Discussed sx mngt. Stable for discharge home.   Assessment and Plan   1. [redacted] weeks gestation of pregnancy   2. NST (non-stress test) reactive   3. COVID-19 affecting pregnancy in third trimester   4. Nausea/vomiting in pregnancy    Discharge home Follow up at [facility] as scheduled Rx Paxlovid Rx Zofran Rx Tylenol  Allergies as of  12/10/2022       Reactions   Other Anaphylaxis, Itching   Seasonal allergies, dog fur, certain detergents  Pt. Reports mother has used "epi-pen" for past allergies.    Pineapple Anaphylaxis   Fish-derived Products Diarrhea, Hives, Itching   Zofran [ondansetron Hcl] Nausea And Vomiting        Medication List     STOP taking these medications    OLANZapine 2.5 MG tablet Commonly known as: ZyPREXA       TAKE these medications    Accu-Chek Guide test strip Generic drug: glucose blood Use to check blood sugars four times a day was instructed   Accu-Chek Guide w/Device Kit 1 Device by Does not apply route 4 (four) times daily.   Accu-Chek Softclix Lancets lancets Use as instructed   acetaminophen 500 MG tablet Commonly known as: TYLENOL Take 2 tablets (1,000 mg total) by mouth every 6 (six) hours as needed for fever, mild pain or headache. What changed:  how much to take reasons to take this   ASPIRIN 81 PO Take 1 tablet by mouth daily.   cyclobenzaprine 10 MG tablet Commonly known as: FLEXERIL Take 1 tablet (10 mg total) by mouth 3 (three) times daily as needed for muscle spasms (or headache).   Dexcom G6 Sensor Misc Apply 1 sensor every 10 days.   Dexcom G6 Transmitter Misc New transmitter needed every 3 months.   EPINEPHrine 0.3 mg/0.3 mL Soaj injection Commonly known as: EpiPen 2-Pak Inject 0.3 mg into the muscle as needed for anaphylaxis.  guaiFENesin 600 MG 12 hr tablet Commonly known as: Mucinex Take 1 tablet (600 mg total) by mouth 2 (two) times daily.   insulin glargine 100 UNIT/ML Solostar Pen Commonly known as: LANTUS Inject 10 Units into the skin at bedtime. What changed:  how much to take additional instructions   Paxlovid (300/100) 20 x 150 MG & 10 x 100MG Tbpk Generic drug: nirmatrelvir & ritonavir Take 1 tablet by mouth 2 (two) times daily.   Pen Needles 3/16" 31G X 5 MM Misc Use with insulin pen daily @ bedtime   PrePLUS 27-1 MG  Tabs Take 1 tablet by mouth daily.   promethazine 25 MG tablet Commonly known as: PHENERGAN Take 1 tablet (25 mg total) by mouth every 6 (six) hours as needed for nausea or vomiting.   sertraline 50 MG tablet Commonly known as: ZOLOFT Take 1 tablet (50 mg total) by mouth daily.   valACYclovir 500 MG tablet Commonly known as: VALTREX Take 1 tablet (500 mg total) by mouth 2 (two) times daily.        Julianne Handler, CNM 12/10/2022, 7:29 AM

## 2022-12-10 NOTE — MAU Note (Addendum)
.  Courtney Holloway is a 21 y.o. at [redacted]w[redacted]d here in MAU reporting nasal congestion, sore throat, and feeling bad for 4 days and getting worse.  Very swollen with decreased FM for 2 days. Chills, cough, and headache. NOmeds for last 12 hrs Onset of complaint: 4days Pain score: 8 Vitals:   12/10/22 0532 12/10/22 0538  Pulse: (!) 129   Resp: 18 20  Temp: 98.1 F (36.7 C)   SpO2: 99%      FHT:148 Lab orders placed from triage:

## 2022-12-11 ENCOUNTER — Encounter: Payer: Medicaid Other | Admitting: Obstetrics & Gynecology

## 2022-12-16 ENCOUNTER — Ambulatory Visit (INDEPENDENT_AMBULATORY_CARE_PROVIDER_SITE_OTHER): Payer: Medicaid Other | Admitting: Clinical

## 2022-12-16 ENCOUNTER — Other Ambulatory Visit: Payer: Medicaid Other

## 2022-12-16 ENCOUNTER — Other Ambulatory Visit: Payer: Self-pay

## 2022-12-16 ENCOUNTER — Other Ambulatory Visit (HOSPITAL_COMMUNITY)
Admission: RE | Admit: 2022-12-16 | Discharge: 2022-12-16 | Disposition: A | Discharge status: Home / Self Care | Payer: Medicaid Other | Source: Ambulatory Visit | Attending: Obstetrics and Gynecology | Admitting: Obstetrics and Gynecology

## 2022-12-16 ENCOUNTER — Ambulatory Visit (INDEPENDENT_AMBULATORY_CARE_PROVIDER_SITE_OTHER): Payer: Medicaid Other | Admitting: Obstetrics and Gynecology

## 2022-12-16 VITALS — BP 113/84 | HR 122 | Wt 268.4 lb

## 2022-12-16 DIAGNOSIS — Z3483 Encounter for supervision of other normal pregnancy, third trimester: Secondary | ICD-10-CM | POA: Diagnosis not present

## 2022-12-16 DIAGNOSIS — O0993 Supervision of high risk pregnancy, unspecified, third trimester: Secondary | ICD-10-CM

## 2022-12-16 DIAGNOSIS — R8271 Bacteriuria: Secondary | ICD-10-CM

## 2022-12-16 DIAGNOSIS — Z3A36 36 weeks gestation of pregnancy: Secondary | ICD-10-CM | POA: Insufficient documentation

## 2022-12-16 DIAGNOSIS — A609 Anogenital herpesviral infection, unspecified: Secondary | ICD-10-CM

## 2022-12-16 DIAGNOSIS — Z6841 Body Mass Index (BMI) 40.0 and over, adult: Secondary | ICD-10-CM

## 2022-12-16 DIAGNOSIS — F339 Major depressive disorder, recurrent, unspecified: Secondary | ICD-10-CM

## 2022-12-16 DIAGNOSIS — F411 Generalized anxiety disorder: Secondary | ICD-10-CM

## 2022-12-16 DIAGNOSIS — O24414 Gestational diabetes mellitus in pregnancy, insulin controlled: Secondary | ICD-10-CM

## 2022-12-16 DIAGNOSIS — Z8659 Personal history of other mental and behavioral disorders: Secondary | ICD-10-CM

## 2022-12-16 DIAGNOSIS — Z3490 Encounter for supervision of normal pregnancy, unspecified, unspecified trimester: Secondary | ICD-10-CM

## 2022-12-16 DIAGNOSIS — O99210 Obesity complicating pregnancy, unspecified trimester: Secondary | ICD-10-CM

## 2022-12-16 NOTE — Progress Notes (Signed)
   PRENATAL VISIT NOTE  Subjective:  Courtney Holloway is a 22 y.o. G1P0 at [redacted]w[redacted]d being seen today for ongoing prenatal care.  She is currently monitored for the following issues for this high-risk pregnancy and has GAD (generalized anxiety disorder); Asthma; Slow transit constipation; Hidradenitis axillaris; Dyslipidemia; Post-traumatic stress disorder; MDD (major depressive disorder), recurrent severe, without psychosis ([place]); Attention deficit hyperactivity disorder (ADHD), combined type; Drug overdose, multiple drugs, undetermined intent, initial encounter; Anal fissure; Nightmares associated with chronic post-traumatic stress disorder; Prediabetes; Body mass index (BMI) of 39.0 to 39.9 in adult; Elevated blood pressure reading without diagnosis of hypertension; Supervision of high-risk pregnancy; Maternal morbid obesity, antepartum ([place]); Diabetes mellitus during pregnancy in third trimester; GBS bacteriuria; Suicidal ideation; HSV (herpes simplex virus) anogenital infection; and BMI 50.0-59.9, adult ([place]) on their problem list.  Patient doing well with no acute concerns today. She reports  pelvic pressure/discomfort and back pain .  Contractions: Irregular (contractions between 5-15 minutes). Vag. Bleeding: None.  Movement: Present. Equivocal regarding leaking fluid. Denies leaking of fluid.   The following portions of the patient's history were reviewed and updated as appropriate: allergies, current medications, past family history, past medical history, past social history, past surgical history and problem list. Problem list updated.  Objective:   Vitals:   12/16/22 1328  BP: 113/84  Pulse: (!) 122  Weight: 268 lb 6.4 oz (121.7 kg)    Fetal Status: Fetal Heart Rate (bpm): 144 Fundal Height: 41 cm Movement: Present     General:  Alert, oriented and cooperative. Patient is in no acute distress.  Skin: Skin is warm and dry. No rash noted.   Cardiovascular: Normal heart rate noted   Respiratory: Normal respiratory effort, no problems with respiration noted  Abdomen: Soft, gravid, appropriate for gestational age.  Pain/Pressure: Present (sharp pain in abdomen)     Pelvic: Cervical exam performed Dilation: Fingertip Effacement (%): 40 Station: Ballotable  Extremities: Normal range of motion.  Edema: Mild pitting, slight indentation  Mental Status:  Normal mood and affect. Normal behavior. Normal judgment and thought content.   Assessment and Plan:  Pregnancy: G1P0 at [redacted]w[redacted]d  1. [redacted] weeks gestation of pregnancy   2. Supervision of high risk pregnancy in third trimester Pt scheduled for IOL on 12/22/22  3. Maternal morbid obesity, antepartum ([place])   4. BMI 50.0-59.9, adult ([place])   5. HSV (herpes simplex virus) anogenital infection Continue prophylaxis, no obvious lesions noted today  6. GBS bacteriuria Treat in labor  7. Insulin controlled gestational diabetes mellitus (GDM) in third trimester FBS: 90 avg PPBS: 170-200 No real improvement in blood sugars, pt scheduled for IOL at 37.1 weeks  Preterm labor symptoms and general obstetric precautions including but not limited to vaginal bleeding, contractions, leaking of fluid and fetal movement were reviewed in detail with the patient.  Please refer to After Visit Summary for other counseling recommendations.   No follow-ups on file.   Lynnda Shields, MD Faculty Attending Center for [facility]

## 2022-12-16 NOTE — Addendum Note (Signed)
Addended by: Forrestine Him A on: 12/16/2022 03:53 PM   Modules accepted: Orders

## 2022-12-16 NOTE — Patient Instructions (Addendum)
Center for [facility] at [facility] [address], [place] [address] [phone] (main office) [phone] [facility] office)  [email or web address]  [address] [place], [place] [address] [phone] or [phone] [facility] 24/7 FOR ANYONE [address] [place], [place] Fax: [phone] [email or web address] *Interpreters available *Accepts all insurance and uninsured for Urgent Care needs *Accepts Medicaid and uninsured for outpatient treatment (below)    ONLY FOR [facility]  Below:   Outpatient New Patient Assessment/Therapy Walk-ins:        Monday -Thursday 8am until slots are full.        Every Friday 1pm-4pm  (first come, first served)                   New Patient Psychiatry/Medication Management        Monday-Friday 8am-11am (first come, first served)              For all walk-ins we ask that you arrive by 7:15am, because patients will be seen in the order of arrival.

## 2022-12-17 ENCOUNTER — Telehealth (HOSPITAL_COMMUNITY): Payer: Self-pay | Admitting: *Deleted

## 2022-12-17 ENCOUNTER — Other Ambulatory Visit: Payer: Self-pay | Admitting: Advanced Practice Midwife

## 2022-12-17 ENCOUNTER — Encounter (HOSPITAL_COMMUNITY): Payer: Self-pay

## 2022-12-17 LAB — GC/CHLAMYDIA PROBE AMP (~~LOC~~) NOT AT ARMC
Chlamydia: NEGATIVE
Comment: NEGATIVE
Comment: NORMAL
Neisseria Gonorrhea: NEGATIVE

## 2022-12-17 NOTE — Telephone Encounter (Signed)
Preadmission screen  

## 2022-12-18 ENCOUNTER — Encounter: Payer: Self-pay | Admitting: Student

## 2022-12-18 NOTE — Telephone Encounter (Signed)
Preadmission screen  

## 2022-12-19 ENCOUNTER — Ambulatory Visit: Payer: Medicaid Other

## 2022-12-19 ENCOUNTER — Other Ambulatory Visit: Payer: Medicaid Other

## 2022-12-22 ENCOUNTER — Inpatient Hospital Stay (HOSPITAL_COMMUNITY): Payer: Medicaid Other

## 2022-12-22 ENCOUNTER — Other Ambulatory Visit: Payer: Self-pay

## 2022-12-22 ENCOUNTER — Inpatient Hospital Stay (HOSPITAL_COMMUNITY): Payer: Medicaid Other | Admitting: Anesthesiology

## 2022-12-22 ENCOUNTER — Encounter (HOSPITAL_COMMUNITY): Payer: Self-pay | Admitting: Obstetrics and Gynecology

## 2022-12-22 ENCOUNTER — Inpatient Hospital Stay (HOSPITAL_COMMUNITY)
Admission: RE | Admit: 2022-12-22 | Discharge: 2022-12-26 | DRG: 787 | Disposition: A | Discharge status: Home / Self Care | Payer: Medicaid Other | Attending: Obstetrics & Gynecology | Admitting: Obstetrics & Gynecology

## 2022-12-22 DIAGNOSIS — O24414 Gestational diabetes mellitus in pregnancy, insulin controlled: Principal | ICD-10-CM | POA: Diagnosis present

## 2022-12-22 DIAGNOSIS — Z87891 Personal history of nicotine dependence: Secondary | ICD-10-CM

## 2022-12-22 DIAGNOSIS — O99824 Streptococcus B carrier state complicating childbirth: Secondary | ICD-10-CM | POA: Diagnosis present

## 2022-12-22 DIAGNOSIS — A609 Anogenital herpesviral infection, unspecified: Secondary | ICD-10-CM | POA: Diagnosis present

## 2022-12-22 DIAGNOSIS — A6000 Herpesviral infection of urogenital system, unspecified: Secondary | ICD-10-CM | POA: Diagnosis present

## 2022-12-22 DIAGNOSIS — F419 Anxiety disorder, unspecified: Secondary | ICD-10-CM | POA: Diagnosis present

## 2022-12-22 DIAGNOSIS — O9089 Other complications of the puerperium, not elsewhere classified: Secondary | ICD-10-CM | POA: Diagnosis present

## 2022-12-22 DIAGNOSIS — O134 Gestational [pregnancy-induced] hypertension without significant proteinuria, complicating childbirth: Secondary | ICD-10-CM

## 2022-12-22 DIAGNOSIS — Z98891 History of uterine scar from previous surgery: Principal | ICD-10-CM

## 2022-12-22 DIAGNOSIS — O0990 Supervision of high risk pregnancy, unspecified, unspecified trimester: Secondary | ICD-10-CM

## 2022-12-22 DIAGNOSIS — O9832 Other infections with a predominantly sexual mode of transmission complicating childbirth: Secondary | ICD-10-CM | POA: Diagnosis present

## 2022-12-22 DIAGNOSIS — F332 Major depressive disorder, recurrent severe without psychotic features: Secondary | ICD-10-CM | POA: Diagnosis present

## 2022-12-22 DIAGNOSIS — Z3A37 37 weeks gestation of pregnancy: Secondary | ICD-10-CM | POA: Diagnosis not present

## 2022-12-22 DIAGNOSIS — O24424 Gestational diabetes mellitus in childbirth, insulin controlled: Principal | ICD-10-CM | POA: Diagnosis present

## 2022-12-22 DIAGNOSIS — O99344 Other mental disorders complicating childbirth: Secondary | ICD-10-CM | POA: Diagnosis present

## 2022-12-22 DIAGNOSIS — O9081 Anemia of the puerperium: Secondary | ICD-10-CM | POA: Diagnosis present

## 2022-12-22 DIAGNOSIS — Z3490 Encounter for supervision of normal pregnancy, unspecified, unspecified trimester: Secondary | ICD-10-CM

## 2022-12-22 DIAGNOSIS — E785 Hyperlipidemia, unspecified: Secondary | ICD-10-CM | POA: Diagnosis present

## 2022-12-22 DIAGNOSIS — F902 Attention-deficit hyperactivity disorder, combined type: Secondary | ICD-10-CM | POA: Diagnosis present

## 2022-12-22 DIAGNOSIS — F411 Generalized anxiety disorder: Secondary | ICD-10-CM | POA: Diagnosis present

## 2022-12-22 DIAGNOSIS — M7989 Other specified soft tissue disorders: Secondary | ICD-10-CM | POA: Diagnosis present

## 2022-12-22 DIAGNOSIS — O165 Unspecified maternal hypertension, complicating the puerperium: Secondary | ICD-10-CM | POA: Insufficient documentation

## 2022-12-22 DIAGNOSIS — O99214 Obesity complicating childbirth: Secondary | ICD-10-CM | POA: Diagnosis present

## 2022-12-22 DIAGNOSIS — R8271 Bacteriuria: Secondary | ICD-10-CM | POA: Diagnosis present

## 2022-12-22 DIAGNOSIS — R519 Headache, unspecified: Secondary | ICD-10-CM | POA: Diagnosis present

## 2022-12-22 LAB — TYPE AND SCREEN
ABO/RH(D): B POS
Antibody Screen: NEGATIVE

## 2022-12-22 LAB — COMPREHENSIVE METABOLIC PANEL
ALT: 11 U/L (ref 0–44)
AST: 19 U/L (ref 15–41)
Albumin: 2.4 g/dL — ABNORMAL LOW (ref 3.5–5.0)
Alkaline Phosphatase: 101 U/L (ref 38–126)
Anion gap: 9 (ref 5–15)
BUN: 5 mg/dL — ABNORMAL LOW (ref 6–20)
CO2: 19 mmol/L — ABNORMAL LOW (ref 22–32)
Calcium: 8.2 mg/dL — ABNORMAL LOW (ref 8.9–10.3)
Chloride: 106 mmol/L (ref 98–111)
Creatinine, Ser: 0.52 mg/dL (ref 0.44–1.00)
GFR, Estimated: >60 mL/min (ref >60)
Glucose, Bld: 128 mg/dL — ABNORMAL HIGH (ref 70–99)
Potassium: 3.5 mmol/L (ref 3.5–5.1)
Sodium: 134 mmol/L — ABNORMAL LOW (ref 135–145)
Total Bilirubin: 0.6 mg/dL (ref 0.3–1.2)
Total Protein: 5.9 g/dL — ABNORMAL LOW (ref 6.5–8.1)

## 2022-12-22 LAB — CBC
HCT: 29.6 % — ABNORMAL LOW (ref 36.0–46.0)
Hemoglobin: 9.7 g/dL — ABNORMAL LOW (ref 12.0–15.0)
MCH: 25.1 pg — ABNORMAL LOW (ref 26.0–34.0)
MCHC: 32.8 g/dL (ref 30.0–36.0)
MCV: 76.7 fL — ABNORMAL LOW (ref 80.0–100.0)
Platelets: 330 10*3/uL (ref 150–400)
RBC: 3.86 MIL/uL — ABNORMAL LOW (ref 3.87–5.11)
RDW: 16.7 % — ABNORMAL HIGH (ref 11.5–15.5)
WBC: 9.9 10*3/uL (ref 4.0–10.5)
nRBC: 0 % (ref 0.0–0.2)

## 2022-12-22 LAB — RPR: RPR Ser Ql: NONREACTIVE

## 2022-12-22 LAB — GLUCOSE, CAPILLARY
Glucose-Capillary: 109 mg/dL — ABNORMAL HIGH (ref 70–99)
Glucose-Capillary: 71 mg/dL (ref 70–99)
Glucose-Capillary: 84 mg/dL (ref 70–99)
Glucose-Capillary: 88 mg/dL (ref 70–99)

## 2022-12-22 MED ORDER — EPHEDRINE 5 MG/ML INJ: 10.0000 mg | INTRAVENOUS | Status: DC | PRN

## 2022-12-22 MED ORDER — ACETAMINOPHEN 325 MG PO TABS
650.0000 mg | ORAL_TABLET | ORAL | Status: DC | PRN
  Administered 2022-12-23: 650 mg via ORAL
  Filled 2022-12-22: qty 2

## 2022-12-22 MED ORDER — MISOPROSTOL 50MCG HALF TABLET
50.0000 ug | ORAL_TABLET | Freq: Once | ORAL | Status: AC
  Administered 2022-12-22: 50 ug via ORAL
  Filled 2022-12-22: qty 1

## 2022-12-22 MED ORDER — PHENYLEPHRINE 80 MCG/ML (10ML) SYRINGE FOR IV PUSH (FOR BLOOD PRESSURE SUPPORT): 80.0000 ug | PREFILLED_SYRINGE | INTRAVENOUS | Status: DC | PRN

## 2022-12-22 MED ORDER — FENTANYL CITRATE (PF) 100 MCG/2ML IJ SOLN
50.0000 ug | INTRAMUSCULAR | Status: DC | PRN
  Administered 2022-12-22: 100 ug via INTRAVENOUS
  Filled 2022-12-22: qty 2

## 2022-12-22 MED ORDER — LIDOCAINE HCL (PF) 1 % IJ SOLN
INTRAMUSCULAR | Status: DC | PRN
  Administered 2022-12-22: 5 mL via EPIDURAL
  Administered 2022-12-22: 4 mL via EPIDURAL

## 2022-12-22 MED ORDER — FENTANYL-BUPIVACAINE-NACL 0.5-0.125-0.9 MG/250ML-% EP SOLN
12.0000 mL/h | EPIDURAL | Status: DC | PRN
  Administered 2022-12-22 – 2022-12-23 (×2): 12 mL/h via EPIDURAL
  Filled 2022-12-22 (×2): qty 250

## 2022-12-22 MED ORDER — LACTATED RINGERS IV SOLN: INTRAVENOUS | Status: DC

## 2022-12-22 MED ORDER — HYDROXYZINE HCL 50 MG PO TABS: 50.0000 mg | ORAL_TABLET | Freq: Four times a day (QID) | ORAL | Status: DC | PRN

## 2022-12-22 MED ORDER — DIPHENHYDRAMINE HCL 50 MG/ML IJ SOLN: 12.5000 mg | INTRAMUSCULAR | Status: DC | PRN

## 2022-12-22 MED ORDER — OXYTOCIN BOLUS FROM INFUSION: 333.0000 mL | Freq: Once | INTRAVENOUS | Status: DC

## 2022-12-22 MED ORDER — LACTATED RINGERS IV SOLN
500.0000 mL | INTRAVENOUS | Status: DC | PRN
  Administered 2022-12-23: 500 mL via INTRAVENOUS

## 2022-12-22 MED ORDER — PENICILLIN G POT IN DEXTROSE 60000 UNIT/ML IV SOLN
3.0000 10*6.[IU] | INTRAVENOUS | Status: DC
  Administered 2022-12-22 – 2022-12-23 (×5): 3 10*6.[IU] via INTRAVENOUS
  Filled 2022-12-22 (×5): qty 50

## 2022-12-22 MED ORDER — SODIUM CHLORIDE 0.9 % IV SOLN
5.0000 10*6.[IU] | Freq: Once | INTRAVENOUS | Status: AC
  Administered 2022-12-22: 5 10*6.[IU] via INTRAVENOUS
  Filled 2022-12-22: qty 5

## 2022-12-22 MED ORDER — SOD CITRATE-CITRIC ACID 500-334 MG/5ML PO SOLN
30.0000 mL | ORAL | Status: DC | PRN
  Filled 2022-12-22: qty 30

## 2022-12-22 MED ORDER — PHENYLEPHRINE 80 MCG/ML (10ML) SYRINGE FOR IV PUSH (FOR BLOOD PRESSURE SUPPORT)
80.0000 ug | PREFILLED_SYRINGE | INTRAVENOUS | Status: AC | PRN
  Administered 2022-12-22 – 2022-12-23 (×3): 80 ug via INTRAVENOUS
  Filled 2022-12-22: qty 10

## 2022-12-22 MED ORDER — LIDOCAINE HCL (PF) 1 % IJ SOLN: 30.0000 mL | INTRAMUSCULAR | Status: DC | PRN

## 2022-12-22 MED ORDER — MISOPROSTOL 25 MCG QUARTER TABLET
25.0000 ug | ORAL_TABLET | Freq: Once | ORAL | Status: AC
  Administered 2022-12-22: 25 ug via VAGINAL
  Filled 2022-12-22: qty 1

## 2022-12-22 MED ORDER — OXYTOCIN-SODIUM CHLORIDE 30-0.9 UT/500ML-% IV SOLN
1.0000 m[IU]/min | INTRAVENOUS | Status: DC
  Administered 2022-12-22: 2 m[IU]/min via INTRAVENOUS
  Filled 2022-12-22: qty 500

## 2022-12-22 MED ORDER — TERBUTALINE SULFATE 1 MG/ML IJ SOLN
0.2500 mg | Freq: Once | INTRAMUSCULAR | Status: DC | PRN
  Filled 2022-12-22: qty 1

## 2022-12-22 MED ORDER — OXYCODONE-ACETAMINOPHEN 5-325 MG PO TABS: 1.0000 | ORAL_TABLET | ORAL | Status: DC | PRN

## 2022-12-22 MED ORDER — OXYTOCIN-SODIUM CHLORIDE 30-0.9 UT/500ML-% IV SOLN: 2.5000 [IU]/h | INTRAVENOUS | Status: DC

## 2022-12-22 MED ORDER — ONDANSETRON HCL 4 MG/2ML IJ SOLN
4.0000 mg | Freq: Four times a day (QID) | INTRAMUSCULAR | Status: DC | PRN
  Administered 2022-12-22: 4 mg via INTRAVENOUS
  Filled 2022-12-22: qty 2

## 2022-12-22 MED ORDER — LACTATED RINGERS IV SOLN
500.0000 mL | Freq: Once | INTRAVENOUS | Status: AC
  Administered 2022-12-22: 500 mL via INTRAVENOUS

## 2022-12-22 NOTE — Progress Notes (Signed)
Attempted to place foley catheter following epidural placement. Pt expressed discomfort at insertion of catheter and asked for me to stop. Catheter was removed and patient was educated on necessity of urethral catheter during epidural pain control. Pt agreed to try again later when she is more numb.

## 2022-12-22 NOTE — Anesthesia Procedure Notes (Signed)
Epidural Patient location during procedure: OB Start time: 12/22/2022 6:57 PM End time: 12/22/2022 7:02 PM  Staffing Anesthesiologist: Audry Pili, MD Performed: anesthesiologist   Preanesthetic Checklist Completed: patient identified, IV checked, risks and benefits discussed, monitors and equipment checked, pre-op evaluation and timeout performed  Epidural Patient position: sitting Prep: DuraPrep Patient monitoring: continuous pulse ox and blood pressure Approach: midline Location: L2-L3 Injection technique: LOR saline  Needle:  Needle type: Tuohy  Needle gauge: 17 G Needle length: 9 cm Needle insertion depth: 9 cm Catheter size: 19 Gauge Catheter at skin depth: 14 cm Test dose: negative and Other (1% lidocaine)  Assessment Events: blood not aspirated and no cerebrospinal fluid  Additional Notes Patient identified. Risks including, but not limited to, bleeding, infection, nerve damage, paralysis, inadequate analgesia, blood pressure changes, nausea, vomiting, allergic reaction, postpartum back pain, itching, and headache were discussed. Patient expressed understanding and wished to proceed. Sterile prep and drape, including hand hygiene, mask, and sterile gloves were used. The patient was positioned and the spine was prepped. The skin was anesthetized with lidocaine. No paraesthesia or other complication noted. The patient did not experience any signs of intravascular injection such as tinnitus or metallic taste in mouth, nor signs of intrathecal spread such as rapid motor block. Please see nursing notes for vital signs. The patient tolerated the procedure well.   Renold Don, MDReason for block:procedure for pain

## 2022-12-22 NOTE — Progress Notes (Signed)
Labor Progress Note  Courtney Holloway is a 22 y.o. G1P0 at [redacted]w[redacted]d presented for IOL 2/2 GDMA2  S: No acute concerns. Requesting to eat. Tolerating the pain well.  O:  BP 131/74   Pulse (!) 116   Temp 98.4 F (36.9 C) (Oral)   Resp 18   Ht 5\' 1"  (1.549 m)   Wt 124.5 kg   LMP 04/01/2022 (Exact Date)   BMI 51.87 kg/m  EFM: 150 bpm/Moderate variability/ 10x10 accels/ None decels  CVE: Dilation: Fingertip Effacement (%): 40 Cervical Position: Posterior Station: -3 Presentation: Vertex Exam by:: Scientist, clinical (histocompatibility and immunogenetics)   A&P: 22 y.o. G1P0 [redacted]w[redacted]d  here for IOL as above  #Labor: Progressing well. Foley balloon placed and inflated with 60cc saline. Contracting back to back, will give additional Cytotec 27mcg orally when contractions space out. #Pain: PO/IV pain meds and Epidural when requested #FWB: CAT 1 #GBS positive - PCN #A2GDM: CBG q4h, last CBG was 71. CTM #HSV: no genital lesions on speculum and visual vaginal exam   Colletta Maryland, MD 12/22/22  2:31 PM

## 2022-12-22 NOTE — Inpatient Diabetes Management (Signed)
Inpatient Diabetes Program Recommendations  AACE/ADA: New Consensus Statement on Inpatient Glycemic Control (2015)  Target Ranges:  Prepandial:   less than 140 mg/dL      Peak postprandial:   less than 180 mg/dL (1-2 hours)      Critically ill patients:  140 - 180 mg/dL   Lab Results  Component Value Date   GLUCAP 71 12/22/2022   HGBA1C 6.3 (H) 11/17/2022    Review of Glycemic Control  Latest Reference Range & Units 11/02/22 08:02 12/22/22 08:18 12/22/22 12:33  Glucose-Capillary 70 - 99 mg/dL 87 109 (H) 71  (H): Data is abnormally high  Diabetes history: DM Outpatient Diabetes medications: Lantus 14 units QAM, 16 units QHS Current orders for Inpatient glycemic control: CBGs Q4  Received referral for DM management evaluation.  Spoke with her at bedside.  She states her blood glucose was 190 mg/dL at 13 weeks after her 1 hr gtt.  She states at first she was diet controlled.  When she was 25 weeks her glucose trends were >200 mg/dL.  She was started on Lantus QD.  She states at every check up her Lantus was increased dur to elevated glucose.  She says prior to admission for IOL her glucose was running in the 200's.  She is aware of possibility of infant requiring NICU.    Currently CBGs at goal.  Will follow closely.    Thank you, Reche Dixon, MSN, [place] Diabetes Coordinator Inpatient Diabetes Program [phone] (team pager from 8a-5p)

## 2022-12-22 NOTE — Anesthesia Preprocedure Evaluation (Addendum)
Anesthesia Evaluation  Patient identified by MRN, date of birth, ID band Patient awake    Reviewed: Allergy & Precautions, NPO status , Patient's Chart, lab work & pertinent test results  History of Anesthesia Complications Negative for: history of anesthetic complications  Airway Mallampati: III   Neck ROM: Full    Dental  (+) Teeth Intact   Pulmonary asthma , former smoker   Pulmonary exam normal        Cardiovascular hypertension (gestational, no meds), Normal cardiovascular exam     Neuro/Psych  Headaches PSYCHIATRIC DISORDERS Anxiety Depression     PTSD Hx SI    GI/Hepatic negative GI ROS, Neg liver ROS,,,  Endo/Other  diabetes, Gestational, Insulin Dependent  Morbid obesity  Renal/GU negative Renal ROS     Musculoskeletal negative musculoskeletal ROS (+)    Abdominal  (+) + obese  Peds  Hematology  (+) Blood dyscrasia, anemia  Plt 330k    Anesthesia Other Findings HSV  Reproductive/Obstetrics (+) Pregnancy                             Anesthesia Physical Anesthesia Plan  ASA: 3  Anesthesia Plan: Epidural   Post-op Pain Management:    Induction:   PONV Risk Score and Plan: 2 and Treatment may vary due to age or medical condition  Airway Management Planned: Natural Airway  Additional Equipment: None  Intra-op Plan:   Post-operative Plan:   Informed Consent: I have reviewed the patients History and Physical, chart, labs and discussed the procedure including the risks, benefits and alternatives for the proposed anesthesia with the patient or authorized representative who has indicated his/her understanding and acceptance.       Plan Discussed with: Anesthesiologist  Anesthesia Plan Comments: (Labs reviewed. Platelets acceptable, patient not taking any blood thinning medications. Per RN, FHR tracing reported to be stable enough for sitting procedure. Risks and  benefits discussed with patient, including PDPH, backache, epidural hematoma, failed epidural, blood pressure changes, allergic reaction, and nerve injury. Patient expressed understanding and wished to proceed.)       Anesthesia Quick Evaluation

## 2022-12-22 NOTE — H&P (Signed)
OBSTETRIC ADMISSION HISTORY AND PHYSICAL  GENESSA BEMAN is a 22 y.o. female G1P0 with IUP at [redacted]w[redacted]d by LMP presenting for [place]. She reports +FMs, No LOF, no VB, no blurry vision, headaches or peripheral edema, and RUQ pain.  She plans on Breast milk via bottle feeding. She request nexplanon for birth control. She received her prenatal care at [facility]   Dating: By [place] --->  Estimated Date of Delivery: 01/11/23  Sono:   @[redacted]w[redacted]d , CWD, normal anatomy, cephalic presentation, 6387F, 42% EFW  Prenatal History/Complications:  Patient Active Problem List   Diagnosis Date Noted   Insulin controlled gestational diabetes mellitus (GDM) in third trimester 12/22/2022   BMI 50.0-59.9, adult ([place]) 12/04/2022   HSV (herpes simplex virus) anogenital infection 11/17/2022   Suicidal ideation 10/31/2022   GBS bacteriuria 10/06/2022   Diabetes mellitus during pregnancy in third trimester 09/15/2022   Maternal morbid obesity, antepartum ([place]) 07/16/2022   Supervision of high-risk pregnancy 06/18/2022   Elevated blood pressure reading without diagnosis of hypertension 01/13/2022   Body mass index (BMI) of 39.0 to 39.9 in adult 09/02/2021   Prediabetes 08/04/2021   Nightmares associated with chronic post-traumatic stress disorder 04/01/2021   Anal fissure 03/28/2021   Drug overdose, multiple drugs, undetermined intent, initial encounter 02/02/2021   Attention deficit hyperactivity disorder (ADHD), combined type 01/28/2021   MDD (major depressive disorder), recurrent severe, without psychosis ([place]) 01/16/2021   Dyslipidemia 02/15/2018   Hidradenitis axillaris 07/11/2016   Slow transit constipation 11/21/2014   Asthma 05/04/2013   GAD (generalized anxiety disorder) 11/02/2012   Post-traumatic stress disorder 11/02/2012    Nursing Staff Provider  Office Location  [place] Dating    [place] Health Model [ ]  Traditional [x ] Centering [ ]  Mom-Baby Dyad    Language  English Anatomy US   normal  Flu Vaccine    10/06/22 Genetic/Carrier Screen  NIPS:   LR AFP:  negative  Horizon: negative x 4  TDaP Vaccine   10/06/22 Hgb A1C or  GTT Early 5.7 Third trimester abnormal  COVID Vaccine Moderna- 2 doses   LAB RESULTS   Rhogam   NA Blood Type --/--/B POS (06/12 1157)   Baby Feeding Plan Breast/ Bottle Antibody  negative  Contraception IUD Rubella  immune  Circumcision Yes RPR NON-REACTIVE (01/30 1100)   Pediatrician  List given HBsAg   negative  Support Person Anton( FOB) HCVAb  negative  Prenatal Classes  HIV NON-REACTIVE (01/30 1100)     BTL Consent NA GBS   Positive, urine   VBAC Consent NA Pap  NA due to age       DME Rx Valu.Nieves ] BP cuff [ ]  Weight Scale Waterbirth  [ ]  Class [ ]  Consent [ ]  CNM visit  PHQ9 & GAD7 [  ] new OB [  ] 28 weeks  [  ] 36 weeks Induction  [ ]  Orders Entered [ ] Foley Y/N    Past Medical History: Past Medical History:  Diagnosis Date   Anxiety    Asthma    Depression    Diabetes mellitus during pregnancy in second trimester 09/15/2022   DMDD (disruptive mood dysregulation disorder) ([place]) 12/28/2015   Failed vision screen 11/21/2014   GAD (generalized anxiety disorder)    Gestational diabetes    Headache(784.0)    HSV-1 (herpes simplex virus 1) infection 08/02/2020   Obesity 11/21/2014   Vision abnormalities    wears glasses    Past Surgical History: Past Surgical History:  Procedure Laterality  Date   NO PAST SURGERIES      Obstetrical History: OB History     Gravida  1   Para      Term      Preterm      AB      Living         SAB      IAB      Ectopic      Multiple      Live Births              Social History Social History   Socioeconomic History   Marital status: Single    Spouse name: Not on file   Number of children: Not on file   Years of education: Not on file   Highest education level: Not on file  Occupational History   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 Reports she has tried marijuana 4 times.     Senior at [employer]. Lives in foster care.    Social Determinants of Health   Financial Resource Strain: Not on file  Food Insecurity: No Food Insecurity (12/22/2022)   Hunger Vital Sign    Worried About Running Out of Food in the Last Year: Never true    Ran Out of Food in the Last Year: Never true  Transportation Needs: No Transportation Needs (12/22/2022)   PRAPARE - Administrator, Civil Service (Medical): No    Lack of Transportation (Non-Medical): No  Physical Activity: Not on file  Stress: Not on file  Social Connections: Not on file    Family History: Family History  Problem Relation Age of Onset   Diabetes Mother    Bipolar disorder Mother    Schizophrenia Mother    Multiple sclerosis Mother    Diabetes Father    Bipolar disorder Maternal Grandmother    Alcohol abuse Maternal Grandmother     Allergies: Allergies  Allergen Reactions   Other Anaphylaxis and Itching    Seasonal allergies, dog fur, certain detergents  Pt. Reports mother has used "epi-pen" for past allergies.    Pineapple Anaphylaxis   Fish-Derived Products Diarrhea, Hives and Itching   Zofran [Ondansetron Hcl] Nausea And Vomiting    Medications Prior to Admission  Medication Sig Dispense Refill Last Dose   Accu-Chek Softclix Lancets lancets Use as instructed 100 each 12    acetaminophen (TYLENOL) 500 MG tablet Take 2 tablets (1,000 mg total) by mouth every 6 (six) hours as  needed for fever, mild pain or headache. 30 tablet 0    ASPIRIN 81 PO Take 1 tablet by mouth daily. (Patient not taking: Reported on 10/22/2022)      Blood Glucose Monitoring Suppl (ACCU-CHEK GUIDE) w/Device KIT 1 Device by Does not apply route 4 (four) times daily. 1 kit 0    Continuous Blood Gluc Sensor (DEXCOM G6 SENSOR) MISC  Apply 1 sensor every 10 days. 3 each 11    Continuous Blood Gluc Transmit (DEXCOM G6 TRANSMITTER) MISC New transmitter needed every 3 months. 1 each 3    cyclobenzaprine (FLEXERIL) 10 MG tablet Take 1 tablet (10 mg total) by mouth 3 (three) times daily as needed for muscle spasms (or headache). 30 tablet 0    EPINEPHrine (EPIPEN 2-PAK) 0.3 mg/0.3 mL IJ SOAJ injection Inject 0.3 mg into the muscle as needed for anaphylaxis. 1 each 1    glucose blood (ACCU-CHEK GUIDE) test strip Use to check blood sugars four times a day was instructed 50 each 12    guaiFENesin (MUCINEX) 600 MG 12 hr tablet Take 1 tablet (600 mg total) by mouth 2 (two) times daily. (Patient not taking: Reported on 12/16/2022) 20 tablet 0    insulin glargine (LANTUS) 100 UNIT/ML Solostar Pen Inject 10 Units into the skin at bedtime. (Patient taking differently: Inject 14 Units into the skin at bedtime. 16 u at night) 15 mL 11    Insulin Pen Needle (PEN NEEDLES 3/16") 31G X 5 MM MISC Use with insulin pen daily @ bedtime 100 each 2    Prenatal Vit-Fe Fumarate-FA (PREPLUS) 27-1 MG TABS Take 1 tablet by mouth daily. (Patient not taking: Reported on 12/16/2022) 30 tablet 13    promethazine (PHENERGAN) 25 MG tablet Take 1 tablet (25 mg total) by mouth every 6 (six) hours as needed for nausea or vomiting. 30 tablet 0    sertraline (ZOLOFT) 50 MG tablet Take 1 tablet (50 mg total) by mouth daily. 30 tablet 3    valACYclovir (VALTREX) 500 MG tablet Take 1 tablet (500 mg total) by mouth 2 (two) times daily. 60 tablet 6      Review of Systems   All systems reviewed and negative except as stated in HPI  Blood pressure  110/60, pulse (!) 107, temperature 98.2 F (36.8 C), temperature source Oral, resp. rate 18, height 5\' 1"  (1.549 m), weight 124.5 kg, last menstrual period 04/01/2022. General appearance: alert, cooperative, and appears stated age Lungs: clear to auscultation bilaterally Heart: regular rate and rhythm Abdomen: soft, non-tender; bowel sounds normal Pelvic: no lesions Extremities: Homans sign is negative, no sign of DVT Presentation: cephalic Fetal monitoringBaseline: 145 bpm, Variability: Good {> 6 bpm), Accelerations: Reactive, and Decelerations: Absent Uterine activityFrequency: occasional Dilation: Fingertip Effacement (%): 40 Station: -3 Exam by:: Chelsea RN   Prenatal labs: ABO, Rh: --/--/B POS (01/08 0753) Antibody: NEG (01/08 0753) Rubella: 1.99 (07/19 1009) RPR: Non Reactive (10/23 1124)  HBsAg: Negative (07/19 1009)  HIV: Non Reactive (10/23 1124)  GBS:    1 hr Glucola GDM Genetic screening  nml Anatomy 10-02-1973 nml  Prenatal Transfer Tool  Maternal Diabetes: Yes:  Diabetes Type:  Insulin/Medication controlled Genetic Screening: Normal Maternal Ultrasounds/Referrals: Normal Fetal Ultrasounds or other Referrals:  None Maternal Substance Abuse:  No Significant Maternal Medications:  None Significant Maternal Lab Results:  Group B Strep positive Number of Prenatal Visits:greater than 3 verified prenatal visits Other Comments:  None  Results for orders placed or performed during the hospital encounter of 12/22/22 (from the past 24 hour(s))  Type and screen   Collection Time: 12/22/22  7:53 AM  Result Value Ref Range   ABO/RH(D) B POS    Antibody Screen NEG    Sample Expiration      12/25/2022,2359 Performed at [facility], [address], [place], [place]   CBC   Collection Time: 12/22/22  7:54 AM  Result Value Ref Range   WBC 9.9 4.0 - 10.5 K/uL   RBC 3.86 (L) 3.87 - 5.11 MIL/uL   Hemoglobin 9.7 (L) 12.0 - 15.0 g/dL   HCT 28.7 (L) 68.1 - 15.7 %    MCV 76.7 (L) 80.0 - 100.0 fL   MCH 25.1 (L) 26.0 - 34.0 pg   MCHC 32.8 30.0 - 36.0 g/dL   RDW 26.2 (H) 03.5 - 59.7 %   Platelets 330 150 - 400 K/uL   nRBC 0.0 0.0 - 0.2 %  Comprehensive metabolic panel   Collection Time: 12/22/22  7:54 AM  Result Value Ref Range   Sodium 134 (L) 135 - 145 mmol/L   Potassium 3.5 3.5 - 5.1 mmol/L   Chloride 106 98 - 111 mmol/L   CO2 19 (L) 22 - 32 mmol/L   Glucose, Bld 128 (H) 70 - 99 mg/dL   BUN 5 (L) 6 - 20 mg/dL   Creatinine, Ser 4.16 0.44 - 1.00 mg/dL   Calcium 8.2 (L) 8.9 - 10.3 mg/dL   Total Protein 5.9 (L) 6.5 - 8.1 g/dL   Albumin 2.4 (L) 3.5 - 5.0 g/dL   AST 19 15 - 41 U/L   ALT 11 0 - 44 U/L   Alkaline Phosphatase 101 38 - 126 U/L   Total Bilirubin 0.6 0.3 - 1.2 mg/dL   GFR, Estimated >38 >45 mL/min   Anion gap 9 5 - 15  Glucose, capillary   Collection Time: 12/22/22  8:18 AM  Result Value Ref Range   Glucose-Capillary 109 (H) 70 - 99 mg/dL    Patient Active Problem List   Diagnosis Date Noted   Insulin controlled gestational diabetes mellitus (GDM) in third trimester 12/22/2022   BMI 50.0-59.9, adult ([facility]) 12/04/2022   HSV (herpes simplex virus) anogenital infection 11/17/2022   Suicidal ideation 10/31/2022   GBS bacteriuria 10/06/2022   Diabetes mellitus during pregnancy in third trimester 09/15/2022   Maternal morbid obesity, antepartum ([facility]) 07/16/2022   Supervision of high-risk pregnancy 06/18/2022   Elevated blood pressure reading without diagnosis of hypertension 01/13/2022   Body mass index (BMI) of 39.0 to 39.9 in adult 09/02/2021   Prediabetes 08/04/2021   Nightmares associated with chronic post-traumatic stress disorder 04/01/2021   Anal fissure 03/28/2021   Drug overdose, multiple drugs, undetermined intent, initial encounter 02/02/2021   Attention deficit hyperactivity disorder (ADHD), combined type 01/28/2021   MDD (major depressive disorder), recurrent severe, without psychosis ([facility]) 01/16/2021   Dyslipidemia  02/15/2018   Hidradenitis axillaris 07/11/2016   Slow transit constipation 11/21/2014   Asthma 05/04/2013   GAD (generalized anxiety disorder) 11/02/2012   Post-traumatic stress disorder 11/02/2012    Assessment/Plan:  RUNETTE SCIFRES is a 22 y.o. G1P0 at [redacted]w[redacted]d here for IOL GDMA2  #Labor: SVE closed, will start cytotec 50/53mcg PO/VAG #Pain: IV pain meds, epidural upon request #FWB: CAT 1 #ID:  GBS pos- PCN #MOF: breast milk via bottle #MOC: nexplanon #Circ:  YES #A2GDM: CBG q4h, last CBG was 109. CTM #HSV: no genital lesions on speculum and visual vaginal exam   Myrtie Hawk, DO  12/22/2022, 10:48 AM

## 2022-12-22 NOTE — Progress Notes (Signed)
Labor Progress Note  Courtney Holloway is a 22 y.o. G1P0 at [redacted]w[redacted]d presented for IOL 2/2 GDMA2  S: Comfortable with epidural in place   O:  BP 105/63   Pulse 93   Temp 98 F (36.7 C) (Oral)   Resp 16   Ht 5\' 1"  (1.549 m)   Wt 124.5 kg   LMP 04/01/2022 (Exact Date)   SpO2 97%   BMI 51.87 kg/m  EFM: 140 bpm/Moderate variability/ 10x10 accels/ None decels  CVE: Dilation: 6 Effacement (%): 70 Cervical Position: Posterior Station: -2 Presentation: Vertex Exam by:: K Faucett RN   A&P: 22 y.o. G1P0 [redacted]w[redacted]d  here for IOL as above  #Labor: Progressing well. Continue to titrate pit. Consider IUPC if no change at next check.  #Pain: Epidural in place #FWB: CAT 1 #GBS positive - PCN #A2GDM: CBG q4h. CTM #HSV: no genital lesions on speculum and visual vaginal exam   [name], MD 12/22/22  9:56 PM

## 2022-12-23 ENCOUNTER — Encounter (HOSPITAL_COMMUNITY)
Admission: RE | Disposition: A | Discharge status: Home / Self Care | Payer: Self-pay | Source: Home / Self Care | Attending: Obstetrics & Gynecology

## 2022-12-23 ENCOUNTER — Other Ambulatory Visit: Payer: Self-pay

## 2022-12-23 ENCOUNTER — Encounter (HOSPITAL_COMMUNITY): Payer: Self-pay | Admitting: Obstetrics and Gynecology

## 2022-12-23 DIAGNOSIS — O99824 Streptococcus B carrier state complicating childbirth: Secondary | ICD-10-CM

## 2022-12-23 DIAGNOSIS — O134 Gestational [pregnancy-induced] hypertension without significant proteinuria, complicating childbirth: Secondary | ICD-10-CM

## 2022-12-23 DIAGNOSIS — Z98891 History of uterine scar from previous surgery: Principal | ICD-10-CM

## 2022-12-23 DIAGNOSIS — Z3A37 37 weeks gestation of pregnancy: Secondary | ICD-10-CM

## 2022-12-23 DIAGNOSIS — O24424 Gestational diabetes mellitus in childbirth, insulin controlled: Secondary | ICD-10-CM

## 2022-12-23 LAB — GLUCOSE, CAPILLARY
Glucose-Capillary: 89 mg/dL (ref 70–99)
Glucose-Capillary: 108 mg/dL — ABNORMAL HIGH (ref 70–99)

## 2022-12-23 SURGERY — Surgical Case
Anesthesia: Epidural | Site: Abdomen

## 2022-12-23 MED ORDER — IBUPROFEN 600 MG PO TABS
600.0000 mg | ORAL_TABLET | Freq: Four times a day (QID) | ORAL | Status: DC
  Administered 2022-12-24 – 2022-12-26 (×7): 600 mg via ORAL
  Filled 2022-12-23 (×8): qty 1

## 2022-12-23 MED ORDER — OXYCODONE HCL 5 MG/5ML PO SOLN: 5.0000 mg | Freq: Once | ORAL | Status: DC | PRN

## 2022-12-23 MED ORDER — MAGNESIUM HYDROXIDE 400 MG/5ML PO SUSP: 30.0000 mL | ORAL | Status: DC | PRN

## 2022-12-23 MED ORDER — MORPHINE SULFATE (PF) 0.5 MG/ML IJ SOLN
INTRAMUSCULAR | Status: DC | PRN
  Administered 2022-12-23: 3 mg via EPIDURAL

## 2022-12-23 MED ORDER — WITCH HAZEL-GLYCERIN EX PADS: 1.0000 | MEDICATED_PAD | CUTANEOUS | Status: DC | PRN

## 2022-12-23 MED ORDER — DEXMEDETOMIDINE HCL IN NACL 80 MCG/20ML IV SOLN
INTRAVENOUS | Status: DC | PRN
  Administered 2022-12-23: 8 ug via BUCCAL

## 2022-12-23 MED ORDER — SCOPOLAMINE 1 MG/3DAYS TD PT72
MEDICATED_PATCH | TRANSDERMAL | Status: AC
  Filled 2022-12-23: qty 1

## 2022-12-23 MED ORDER — ZOLPIDEM TARTRATE 5 MG PO TABS: 5.0000 mg | ORAL_TABLET | Freq: Every evening | ORAL | Status: DC | PRN

## 2022-12-23 MED ORDER — OXYTOCIN-SODIUM CHLORIDE 30-0.9 UT/500ML-% IV SOLN
INTRAVENOUS | Status: DC | PRN
  Administered 2022-12-23: 200 mL via INTRAVENOUS

## 2022-12-23 MED ORDER — TRANEXAMIC ACID-NACL 1000-0.7 MG/100ML-% IV SOLN
INTRAVENOUS | Status: DC | PRN
  Administered 2022-12-23: 1000 mg via INTRAVENOUS

## 2022-12-23 MED ORDER — OXYCODONE HCL 5 MG PO TABS: 5.0000 mg | ORAL_TABLET | Freq: Once | ORAL | Status: DC | PRN

## 2022-12-23 MED ORDER — FENTANYL CITRATE (PF) 100 MCG/2ML IJ SOLN
INTRAMUSCULAR | Status: AC
  Filled 2022-12-23: qty 2

## 2022-12-23 MED ORDER — DEXAMETHASONE SODIUM PHOSPHATE 4 MG/ML IJ SOLN
INTRAMUSCULAR | Status: AC
  Filled 2022-12-23: qty 2

## 2022-12-23 MED ORDER — NALOXONE HCL 0.4 MG/ML IJ SOLN: 0.4000 mg | INTRAMUSCULAR | Status: DC | PRN

## 2022-12-23 MED ORDER — MEPERIDINE HCL 25 MG/ML IJ SOLN: 6.2500 mg | INTRAMUSCULAR | Status: DC | PRN

## 2022-12-23 MED ORDER — SIMETHICONE 80 MG PO CHEW
80.0000 mg | CHEWABLE_TABLET | Freq: Three times a day (TID) | ORAL | Status: DC
  Administered 2022-12-23 – 2022-12-26 (×7): 80 mg via ORAL
  Filled 2022-12-23 (×7): qty 1

## 2022-12-23 MED ORDER — SCOPOLAMINE 1 MG/3DAYS TD PT72: 1.0000 | MEDICATED_PATCH | Freq: Once | TRANSDERMAL | Status: DC

## 2022-12-23 MED ORDER — ACETAMINOPHEN 500 MG PO TABS
1000.0000 mg | ORAL_TABLET | Freq: Four times a day (QID) | ORAL | Status: DC
  Administered 2022-12-23 – 2022-12-26 (×10): 1000 mg via ORAL
  Filled 2022-12-23 (×11): qty 2

## 2022-12-23 MED ORDER — FENTANYL CITRATE (PF) 100 MCG/2ML IJ SOLN
INTRAMUSCULAR | Status: DC | PRN
  Administered 2022-12-23: 100 ug via EPIDURAL

## 2022-12-23 MED ORDER — SODIUM CHLORIDE 0.9 % IR SOLN
Status: DC | PRN
  Administered 2022-12-23: 1000 mL

## 2022-12-23 MED ORDER — LACTATED RINGERS IV SOLN: INTRAVENOUS | Status: DC

## 2022-12-23 MED ORDER — ONDANSETRON HCL 4 MG/2ML IJ SOLN: 4.0000 mg | Freq: Three times a day (TID) | INTRAMUSCULAR | Status: DC | PRN

## 2022-12-23 MED ORDER — SODIUM CHLORIDE 0.9 % IV SOLN
500.0000 mg | INTRAVENOUS | Status: AC
  Administered 2022-12-23: 500 mg via INTRAVENOUS

## 2022-12-23 MED ORDER — KETOROLAC TROMETHAMINE 30 MG/ML IJ SOLN
30.0000 mg | Freq: Once | INTRAMUSCULAR | Status: AC | PRN
  Administered 2022-12-23: 30 mg via INTRAVENOUS

## 2022-12-23 MED ORDER — DIPHENHYDRAMINE HCL 25 MG PO CAPS
25.0000 mg | ORAL_CAPSULE | Freq: Four times a day (QID) | ORAL | Status: DC | PRN
  Administered 2022-12-23: 25 mg via ORAL

## 2022-12-23 MED ORDER — ENOXAPARIN SODIUM 60 MG/0.6ML IJ SOSY
60.0000 mg | PREFILLED_SYRINGE | INTRAMUSCULAR | Status: DC
  Administered 2022-12-24 – 2022-12-26 (×3): 60 mg via SUBCUTANEOUS
  Filled 2022-12-23 (×3): qty 0.6

## 2022-12-23 MED ORDER — KETOROLAC TROMETHAMINE 30 MG/ML IJ SOLN
INTRAMUSCULAR | Status: AC
  Filled 2022-12-23: qty 1

## 2022-12-23 MED ORDER — ACETAMINOPHEN 10 MG/ML IV SOLN
INTRAVENOUS | Status: DC | PRN
  Administered 2022-12-23: 1000 mg via INTRAVENOUS

## 2022-12-23 MED ORDER — TRANEXAMIC ACID-NACL 1000-0.7 MG/100ML-% IV SOLN: 1000.0000 mg | INTRAVENOUS | Status: AC

## 2022-12-23 MED ORDER — OXYCODONE HCL 5 MG PO TABS
5.0000 mg | ORAL_TABLET | ORAL | Status: DC | PRN
  Administered 2022-12-23: 5 mg via ORAL
  Administered 2022-12-24 – 2022-12-26 (×4): 10 mg via ORAL
  Filled 2022-12-23 (×3): qty 2
  Filled 2022-12-23: qty 1
  Filled 2022-12-23: qty 2

## 2022-12-23 MED ORDER — GABAPENTIN 100 MG PO CAPS
200.0000 mg | ORAL_CAPSULE | Freq: Every day | ORAL | Status: DC
  Administered 2022-12-23 – 2022-12-24 (×2): 200 mg via ORAL
  Filled 2022-12-23 (×2): qty 2

## 2022-12-23 MED ORDER — OXYTOCIN-SODIUM CHLORIDE 30-0.9 UT/500ML-% IV SOLN
2.5000 [IU]/h | INTRAVENOUS | Status: AC
  Administered 2022-12-23: 2.5 [IU]/h via INTRAVENOUS
  Filled 2022-12-23: qty 500

## 2022-12-23 MED ORDER — PRENATAL MULTIVITAMIN CH
1.0000 | ORAL_TABLET | Freq: Every day | ORAL | Status: DC
  Administered 2022-12-24 – 2022-12-26 (×3): 1 via ORAL
  Filled 2022-12-23 (×3): qty 1

## 2022-12-23 MED ORDER — SODIUM CHLORIDE 0.9% FLUSH: 3.0000 mL | INTRAVENOUS | Status: DC | PRN

## 2022-12-23 MED ORDER — STERILE WATER FOR IRRIGATION IR SOLN
Status: DC | PRN
  Administered 2022-12-23: 1000 mL

## 2022-12-23 MED ORDER — DIPHENHYDRAMINE HCL 25 MG PO CAPS
25.0000 mg | ORAL_CAPSULE | ORAL | Status: DC | PRN
  Filled 2022-12-23: qty 1

## 2022-12-23 MED ORDER — SENNOSIDES-DOCUSATE SODIUM 8.6-50 MG PO TABS
2.0000 | ORAL_TABLET | Freq: Every day | ORAL | Status: DC
  Administered 2022-12-24 – 2022-12-25 (×2): 2 via ORAL
  Filled 2022-12-23 (×3): qty 2

## 2022-12-23 MED ORDER — SIMETHICONE 80 MG PO CHEW: 80.0000 mg | CHEWABLE_TABLET | ORAL | Status: DC | PRN

## 2022-12-23 MED ORDER — ONDANSETRON HCL 4 MG/2ML IJ SOLN
INTRAMUSCULAR | Status: AC
  Filled 2022-12-23: qty 2

## 2022-12-23 MED ORDER — HYDROMORPHONE HCL 1 MG/ML IJ SOLN: 0.2500 mg | INTRAMUSCULAR | Status: DC | PRN

## 2022-12-23 MED ORDER — MORPHINE SULFATE (PF) 0.5 MG/ML IJ SOLN
INTRAMUSCULAR | Status: AC
  Filled 2022-12-23: qty 10

## 2022-12-23 MED ORDER — CEFAZOLIN IN SODIUM CHLORIDE 3-0.9 GM/100ML-% IV SOLN
3.0000 g | INTRAVENOUS | Status: AC
  Administered 2022-12-23: 3 g via INTRAVENOUS
  Filled 2022-12-23: qty 100

## 2022-12-23 MED ORDER — DEXAMETHASONE SODIUM PHOSPHATE 10 MG/ML IJ SOLN
INTRAMUSCULAR | Status: DC | PRN
  Administered 2022-12-23: 4 mg via INTRAVENOUS

## 2022-12-23 MED ORDER — SOD CITRATE-CITRIC ACID 500-334 MG/5ML PO SOLN
30.0000 mL | ORAL | Status: AC
  Administered 2022-12-23: 30 mL via ORAL

## 2022-12-23 MED ORDER — SCOPOLAMINE 1 MG/3DAYS TD PT72
MEDICATED_PATCH | TRANSDERMAL | Status: DC | PRN
  Administered 2022-12-23: 1 via TRANSDERMAL

## 2022-12-23 MED ORDER — KETOROLAC TROMETHAMINE 30 MG/ML IJ SOLN
30.0000 mg | Freq: Four times a day (QID) | INTRAMUSCULAR | Status: AC
  Administered 2022-12-23 – 2022-12-24 (×4): 30 mg via INTRAVENOUS
  Filled 2022-12-23 (×4): qty 1

## 2022-12-23 MED ORDER — NALOXONE HCL 4 MG/10ML IJ SOLN: 1.0000 ug/kg/h | INTRAVENOUS | Status: DC | PRN

## 2022-12-23 MED ORDER — ONDANSETRON HCL 4 MG/2ML IJ SOLN
INTRAMUSCULAR | Status: DC | PRN
  Administered 2022-12-23: 4 mg via INTRAVENOUS

## 2022-12-23 MED ORDER — DIPHENHYDRAMINE HCL 50 MG/ML IJ SOLN: 12.5000 mg | INTRAMUSCULAR | Status: DC | PRN

## 2022-12-23 MED ORDER — COCONUT OIL OIL: 1.0000 | TOPICAL_OIL | Status: DC | PRN

## 2022-12-23 MED ORDER — ONDANSETRON HCL 4 MG/2ML IJ SOLN: 4.0000 mg | Freq: Once | INTRAMUSCULAR | Status: DC | PRN

## 2022-12-23 MED ORDER — MENTHOL 3 MG MT LOZG: 1.0000 | LOZENGE | OROMUCOSAL | Status: DC | PRN

## 2022-12-23 MED ORDER — DIBUCAINE (PERIANAL) 1 % EX OINT: 1.0000 | TOPICAL_OINTMENT | CUTANEOUS | Status: DC | PRN

## 2022-12-23 MED ORDER — LIDOCAINE-EPINEPHRINE (PF) 2 %-1:200000 IJ SOLN
INTRAMUSCULAR | Status: DC | PRN
  Administered 2022-12-23: 2 mL via EPIDURAL
  Administered 2022-12-23 (×2): 5 mL via EPIDURAL

## 2022-12-23 MED ORDER — DEXMEDETOMIDINE HCL IN NACL 80 MCG/20ML IV SOLN
INTRAVENOUS | Status: AC
  Filled 2022-12-23: qty 20

## 2022-12-23 MED ORDER — OXYTOCIN-SODIUM CHLORIDE 30-0.9 UT/500ML-% IV SOLN
INTRAVENOUS | Status: AC
  Filled 2022-12-23: qty 500

## 2022-12-23 SURGICAL SUPPLY — 39 items
ADH SKN CLS APL DERMABOND .7 (GAUZE/BANDAGES/DRESSINGS) ×2
APL PRP STRL LF DISP 70% ISPRP (MISCELLANEOUS) ×2
APL SKNCLS STERI-STRIP NONHPOA (GAUZE/BANDAGES/DRESSINGS) ×1
BENZOIN TINCTURE PRP APPL 2/3 (GAUZE/BANDAGES/DRESSINGS) IMPLANT
CHLORAPREP W/TINT 26 (MISCELLANEOUS) ×2 IMPLANT
CLAMP UMBILICAL CORD (MISCELLANEOUS) ×1 IMPLANT
CLOTH BEACON ORANGE TIMEOUT ST (SAFETY) ×1 IMPLANT
DERMABOND ADVANCED .7 DNX12 (GAUZE/BANDAGES/DRESSINGS) ×2 IMPLANT
DRSG OPSITE POSTOP 4X10 (GAUZE/BANDAGES/DRESSINGS) ×1 IMPLANT
ELECT REM PT RETURN 9FT ADLT (ELECTROSURGICAL) ×1
ELECTRODE REM PT RTRN 9FT ADLT (ELECTROSURGICAL) ×1 IMPLANT
EXTRACTOR VACUUM M CUP 4 TUBE (SUCTIONS) IMPLANT
GAUZE SPONGE 4X4 12PLY STRL (GAUZE/BANDAGES/DRESSINGS) IMPLANT
GAUZE SPONGE 4X4 12PLY STRL LF (GAUZE/BANDAGES/DRESSINGS) IMPLANT
GLOVE BIOGEL PI IND STRL 7.0 (GLOVE) ×2 IMPLANT
GLOVE BIOGEL PI IND STRL 7.5 (GLOVE) ×2 IMPLANT
GLOVE ECLIPSE 7.5 STRL STRAW (GLOVE) ×1 IMPLANT
GOWN STRL REUS W/TWL LRG LVL3 (GOWN DISPOSABLE) ×3 IMPLANT
KIT ABG SYR 3ML LUER SLIP (SYRINGE) IMPLANT
MAT PREVALON FULL STRYKER (MISCELLANEOUS) IMPLANT
NDL HYPO 25X5/8 SAFETYGLIDE (NEEDLE) IMPLANT
NEEDLE HYPO 25X5/8 SAFETYGLIDE (NEEDLE) IMPLANT
NS IRRIG 1000ML POUR BTL (IV SOLUTION) ×1 IMPLANT
PACK C SECTION WH (CUSTOM PROCEDURE TRAY) ×1 IMPLANT
PAD ABD 7.5X8 STRL (GAUZE/BANDAGES/DRESSINGS) IMPLANT
PAD OB MATERNITY 4.3X12.25 (PERSONAL CARE ITEMS) ×1 IMPLANT
RETRACTOR TRAXI PANNICULUS (MISCELLANEOUS) IMPLANT
RTRCTR C-SECT PINK 25CM LRG (MISCELLANEOUS) ×1 IMPLANT
STRIP CLOSURE SKIN 1/2X4 (GAUZE/BANDAGES/DRESSINGS) IMPLANT
SUT MNCRL 0 VIOLET CTX 36 (SUTURE) ×2 IMPLANT
SUT MONOCRYL 0 CTX 36 (SUTURE) ×2
SUT VIC AB 0 CTX 36 (SUTURE) ×1
SUT VIC AB 0 CTX36XBRD ANBCTRL (SUTURE) ×1 IMPLANT
SUT VIC AB 2-0 CT1 27 (SUTURE) ×1
SUT VIC AB 2-0 CT1 TAPERPNT 27 (SUTURE) ×1 IMPLANT
SUT VIC AB 4-0 KS 27 (SUTURE) ×1 IMPLANT
TOWEL OR 17X24 6PK STRL BLUE (TOWEL DISPOSABLE) ×1 IMPLANT
TRAY FOLEY W/BAG SLVR 14FR LF (SET/KITS/TRAYS/PACK) ×1 IMPLANT
WATER STERILE IRR 1000ML POUR (IV SOLUTION) ×1 IMPLANT

## 2022-12-23 NOTE — Transfer of Care (Signed)
Immediate Anesthesia Transfer of Care Note  Patient: Courtney Holloway  Procedure(s) Performed: CESAREAN SECTION (Abdomen)  Patient Location: PACU  Anesthesia Type:Epidural  Level of Consciousness: awake, alert , and oriented  Airway & Oxygen Therapy: Patient Spontanous Breathing  Post-op Assessment: Report given to RN and Post -op Vital signs reviewed and stable  Post vital signs: Reviewed and stable HR 117  Last Vitals:  Vitals Value Taken Time  BP 126/74 12/23/22 1051  Temp    Pulse 235 12/23/22 1053  Resp 22 12/23/22 1053  SpO2 98 % 12/23/22 1053  Vitals shown include unvalidated device data.  Last Pain:  Vitals:   12/23/22 0917  TempSrc: Axillary  PainSc:       Patients Stated Pain Goal: 0 (88/50/27 7412)  Complications: No notable events documented.

## 2022-12-23 NOTE — Progress Notes (Signed)
Patient ID: Courtney Holloway, female   DOB: 09-Oct-2001, 22 y.o.   MRN: [id number]  Patient seen - had late decelerations with titration of pitocin. Pitocin stopped and patient requested cesarean delivery.   The risks of cesarean section discussed with the patient included but were not limited to: bleeding which may require transfusion or reoperation; infection which may require antibiotics; injury to bowel, bladder, ureters or other surrounding organs; injury to the fetus; need for additional procedures including hysterectomy in the event of a life-threatening hemorrhage; placental abnormalities wth subsequent pregnancies, incisional problems, thromboembolic phenomenon and other postoperative/anesthesia complications. The patient concurred with the proposed plan, giving informed written consent for the procedure.   Patient has been NPO since last night she will remain NPO for procedure. Anesthesia and OR aware.  Preoperative prophylactic Ancef and azithromycin ordered on call to the OR.  To OR when ready.  Truett Mainland, DO 12/23/2022 9:05 AM

## 2022-12-23 NOTE — Addendum Note (Signed)
Addendum  created 12/23/22 1242 by Barnet Glasgow, MD   Clinical Note Signed

## 2022-12-23 NOTE — Op Note (Signed)
Courtney Holloway PROCEDURE DATE: 12/23/2022  PREOPERATIVE DIAGNOSES: Intrauterine pregnancy at [redacted]w[redacted]d weeks gestation; non-reassuring fetal status  POSTOPERATIVE DIAGNOSES: The same, viable infant delivered  PROCEDURE: Primary Low Transverse Cesarean Section  SURGEON:  Dr. Candelaria Celeste  ASSISTANT:  Myrtie Hawk, DO An experienced assistant was required given the standard of surgical care given the complexity of the case.  This assistant was needed for exposure, dissection, suctioning, retraction, instrument exchange, assisting with delivery with administration of fundal pressure, and for overall help during the procedure.  ANESTHESIOLOGY TEAM: Anesthesiologist: Beryle Lathe, MD; Trevor Iha, MD CRNA: Elbert Ewings, CRNA  INDICATIONS: Courtney Holloway is a 22 y.o. G1P1001 at [redacted]w[redacted]d here for cesarean section secondary to the indications listed under preoperative diagnoses; please see preoperative note for further details.  The risks of surgery were discussed with the patient including but were not limited to: bleeding which may require transfusion or reoperation; infection which may require antibiotics; injury to bowel, bladder, ureters or other surrounding organs; injury to the fetus; need for additional procedures including hysterectomy in the event of a life-threatening hemorrhage; formation of adhesions; placental abnormalities wth subsequent pregnancies; incisional problems; thromboembolic phenomenon and other postoperative/anesthesia complications.  The patient concurred with the proposed plan, giving informed written consent for the procedure.    FINDINGS:  Viable female infant in cephalic presentation.  Apgars 8 and 9.  Amniotic fluid: clear.  Intact placenta, three vessel cord.  Normal uterus, fallopian tubes and ovaries bilaterally.  ANESTHESIA: epidural INTRAVENOUS FLUIDS: 1000 ml   ESTIMATED BLOOD LOSS: 393 ml URINE OUTPUT:  100 ml SPECIMENS: Placenta sent to L&D  . COMPLICATIONS: None immediate  PROCEDURE IN DETAIL:  The patient preoperatively received intravenous antibiotics and had sequential compression devices applied to her lower extremities.  She was then taken to the operating room where the epidural was dosed up to a surgical level and found to be adequate. She was then placed in a dorsal supine position with a leftward tilt, and prepped and draped in a sterile manner.  A foley catheter was  was already in place.  After an adequate timeout was performed, a Pfannenstiel skin incision was made with scalpel and carried through to the underlying layer of fascia. The fascia was incised in the midline, and this incision was extended bluntly. The rectus muscles were separated in the midline and the peritoneum was entered bluntly.   The Alexis self-retaining retractor was introduced into the abdominal cavity.  Attention was turned to the lower uterine segment where a low transverse hysterotomy was made with a scalpel and extended bluntly in caudad and cephalad directions.  The infant was successfully delivered, the cord was clamped and cut after one minute, and the infant was handed over to the awaiting neonatology team. Uterine massage was then administered, and the placenta delivered intact with a three-vessel cord. The uterus was then cleared of clots and debris.  The hysterotomy was closed with 0-Monocryl in a running fashion. A 2x3cm hematoma noted superior to the left aspect of the hysterotomy. A  figure-of-eight 0 monocryl serosal stitch was placed to achieve hemostasis.    The pelvis was cleared of all clot and debris. Hemostasis was confirmed on all surfaces.  The uterus was once again inspected and found to be hemostatic. The retractor was removed.   The peritoneum was closed with a 2-0 Vicryl running stitch. The musculature was inspected and noted to be hemostatic. The fascia was then closed using 0 Vicryl in  a running fashion.  The subcutaneous layer was  irrigated, any areas of bleeding were cauterized with the bovie,  was reapproximated with 2-0 plain gut in a running fashion, was found to be hemostatic.. . The skin was closed with a 4-0 Vicryl subcuticular stitch. The patient tolerated the procedure well. Sponge, instrument and needle counts were correct x 3.  She was taken to the recovery room in stable condition.   Shelda Pal, [place] Fellow, Faculty practice [place] for [facility] 12/23/22  10:39 AM

## 2022-12-23 NOTE — Discharge Summary (Signed)
Postpartum Discharge Summary      Patient Name: Courtney Holloway DOB: [birth year] MRN: [id number]  Date of admission: 12/22/2022 Delivery date:12/23/2022  Delivering provider: Levie Heritage  Date of discharge: 12/26/2022  Admitting diagnosis: Insulin controlled gestational diabetes mellitus (GDM) in third trimester [O24.414] Intrauterine pregnancy: [redacted]w[redacted]d     Secondary diagnosis:  Principal Problem:   Status post primary low transverse cesarean section Active Problems:   GAD (generalized anxiety disorder)   MDD (major depressive disorder), recurrent severe, without psychosis (HCC)   Attention deficit hyperactivity disorder (ADHD), combined type   Supervision of high-risk pregnancy   Maternal morbid obesity, antepartum (HCC)   GBS bacteriuria   HSV (herpes simplex virus) anogenital infection   Insulin controlled gestational diabetes mellitus (GDM) in third trimester  Additional problems: None    Discharge diagnosis: Term Pregnancy Delivered                                              Post partum procedures: IV Venofeer Augmentation: AROM, Pitocin, Cytotec, and IP Foley Complications: None  Hospital course: Induction of Labor With Cesarean Section   22 y.o. yo G1P0 at [redacted]w[redacted]d was admitted to the hospital 12/22/2022 for induction of labor. Patient had a labor course significant for augmentation as above with eventual fetal intolerance. The patient went for cesarean section due to Non-Reassuring FHR. Delivery details are as follows: Membrane Rupture Time/Date: 10:00 AM ,12/23/2022   Delivery Method:C-Section, Low Transverse  Details of operation can be found in separate operative Note.  Patient had a postpartum course complicated by anemia and she was given IV iron infusion. She is ambulating, tolerating a regular diet, passing flatus, and urinating well.  Patient is discharged home in stable condition on 12/26/22.      Newborn Data: Birth date:12/23/2022  Birth time:10:00 AM  Gender:Female   Living status:Living  Apgars:8 ,9  Weight:3730 g                                Magnesium Sulfate received: No BMZ received: No Rhophylac:N/A MMR:N/A T-DaP:Given prenatally Flu: Yes Transfusion:No  Physical exam  Vitals:   12/23/22 0632 12/23/22 0701 12/23/22 0830 12/23/22 0917  BP: 116/71 117/67 127/73   Pulse: (!) 102 (!) 114 (!) 102   Resp:   16   Temp:    98.3 F (36.8 C)  TempSrc:    Axillary  SpO2:      Weight:      Height:       General: alert, cooperative, and no distress Lochia: appropriate Uterine Fundus: firm Incision: Dressing is clean, dry, and intact DVT Evaluation: No evidence of DVT seen on physical exam. Calf/Ankle edema is present Labs: Lab Results  Component Value Date   WBC 9.9 12/22/2022   HGB 9.7 (L) 12/22/2022   HCT 29.6 (L) 12/22/2022   MCV 76.7 (L) 12/22/2022   PLT 330 12/22/2022      Latest Ref Rng & Units 12/22/2022    7:54 AM  CMP  Glucose 70 - 99 mg/dL 347   BUN 6 - 20 mg/dL 5   Creatinine 4.25 - 9.56 mg/dL 3.87   Sodium 564 - 332 mmol/L 134   Potassium 3.5 - 5.1 mmol/L 3.5   Chloride 98 - 111 mmol/L 106   CO2 22 - 32  mmol/L 19   Calcium 8.9 - 10.3 mg/dL 8.2   Total Protein 6.5 - 8.1 g/dL 5.9   Total Bilirubin 0.3 - 1.2 mg/dL 0.6   Alkaline Phos 38 - 126 U/L 101   AST 15 - 41 U/L 19   ALT 0 - 44 U/L 11    Edinburgh Score:     No data to display           After visit meds:  Allergies as of 12/26/2022       Reactions   Other Anaphylaxis, Itching   Seasonal allergies, dog fur, certain detergents  Pt. Reports mother has used "epi-pen" for past allergies.    Pineapple Anaphylaxis   Fish-derived Products Diarrhea, Hives, Itching   Zofran [ondansetron Hcl] Itching        Medication List     STOP taking these medications    Accu-Chek Guide test strip Generic drug: glucose blood   Accu-Chek Guide w/Device Kit   Accu-Chek Softclix Lancets lancets   ASPIRIN 81 PO   cyclobenzaprine 10 MG tablet Commonly  known as: FLEXERIL   Dexcom G6 Sensor Misc   Dexcom G6 Transmitter Misc   guaiFENesin 600 MG 12 hr tablet Commonly known as: Mucinex   insulin glargine 100 UNIT/ML Solostar Pen Commonly known as: LANTUS   Pen Needles 3/16" 31G X 5 MM Misc   promethazine 25 MG tablet Commonly known as: PHENERGAN       TAKE these medications    acetaminophen 500 MG tablet Commonly known as: TYLENOL Take 2 tablets (1,000 mg total) by mouth every 6 (six) hours as needed for fever, mild pain or headache.   EPINEPHrine 0.3 mg/0.3 mL Soaj injection Commonly known as: EpiPen 2-Pak Inject 0.3 mg into the muscle as needed for anaphylaxis.   furosemide 20 MG tablet Commonly known as: LASIX Take 1 tablet (20 mg total) by mouth daily.   hydrOXYzine 25 MG tablet Commonly known as: ATARAX Take 1 tablet (25 mg total) by mouth every 6 (six) hours as needed for anxiety.   ibuprofen 600 MG tablet Commonly known as: ADVIL Take 1 tablet (600 mg total) by mouth every 6 (six) hours.   NIFEdipine 30 MG 24 hr tablet Commonly known as: ADALAT CC Take 1 tablet (30 mg total) by mouth daily.   oxyCODONE 5 MG immediate release tablet Commonly known as: Oxy IR/ROXICODONE Take 1-2 tablets (5-10 mg total) by mouth every 4 (four) hours as needed for moderate pain.   PrePLUS 27-1 MG Tabs Take 1 tablet by mouth daily.   sertraline 50 MG tablet Commonly known as: ZOLOFT Take 1 tablet (50 mg total) by mouth daily.   valACYclovir 500 MG tablet Commonly known as: VALTREX Take 1 tablet (500 mg total) by mouth 2 (two) times daily.         Discharge home in stable condition Infant Feeding: Bottle Infant Disposition:NICU Discharge instruction: per After Visit Summary and Postpartum booklet. Activity: Advance as tolerated. Pelvic rest for 6 weeks.  Diet: routine diet Future Appointments: Future Appointments  Date Time Provider [place]  12/24/2022  1:15 PM Aletha Halim, MD [facility]   12/31/2022  1:15 PM Griffin Basil, MD [facility]  01/07/2023  1:15 PM Woodroe Mode, MD [facility]  01/14/2023  3:15 PM [facility]  01/14/2023  4:15 PM Griffin Basil, MD [facility]   Follow up Visit: Message sent to [facility] 1/9  Please schedule this patient for a In  person postpartum visit in 6 weeks with the following provider: Any provider. Additional Postpartum F/U:Postpartum Depression checkup, 2 hour GTT, and Incision check 1 week  High risk pregnancy complicated by: GDM Delivery mode:  C-Section, Low Transverse  Anticipated Birth Control:  PP Nexplanon placed   12/23/2022 Myrtie Hawk, DO  Cherre Robins MSN, CNM Advanced Practice Provider, [facility] 12/26/2022 1:17 PM

## 2022-12-23 NOTE — Lactation Note (Signed)
This note was copied from a baby's chart. Lactation Consultation Note  Patient Name: Courtney Holloway PTWSF'K Date: 12/23/2022  Reason for consult: Initial assessment;Primapara;1st time breastfeeding;Breastfeeding assistance;Early term 37-38.6wks;Maternal endocrine disorder  Age:20 hours  Lactation consult visit to see mother 5 hours post c/s delivery. Mother had gestation diabetes this pregnancy, taking insulin. Infant had an initial blood sugar of 29 and placed skin to skin and fed 17 ml of formula. Mother said she attempted to breastfeed but baby was not interested. She reports her feeding plan is to pump and give expressed milk by bottle but will put baby to breast until she goes home. 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 Mother reports leaking for several weeks and she tried pumping at 34 weeks but it was uncomfortable. Assisted with football hold, position and latch with basic education. Infant feed well with audible swallows for 10 minutes. Mother wanted to do spoon feeding. With assistance, mother was able to express 5 ml of colostrum and spoon feeding infant was taught to parents. Infant tolerated feedings well and remained skin to skin with lab draw.   Mother was also assisted with pumping using #21 flanges.  Mother reported comfort and preferred to pump one breast at this session.   Lactation service handout given and explained, however, mother became very sleepy and will need more education on [place] OP services.       Feeding Mother's Current Feeding Choice: Breast Milk (pump breast milk and give by bottle)  LATCH Score Latch: Grasps breast easily, tongue down, lips flanged, rhythmical sucking.  Audible Swallowing: Spontaneous and intermittent  Type of Nipple: Everted at rest and after  stimulation  Comfort (Breast/Nipple): Soft / non-tender  Hold (Positioning): Full assist, staff holds infant at breast  LATCH Score: 8   Lactation Tools Discussed/Used   DEBP  Interventions Interventions: Breast feeding basics reviewed;Assisted with latch;Skin to skin;Breast massage;Hand express;Breast compression;Adjust position;Support pillows;Position options;Hand pump;DEBP;Education;LC Services brochure;Expressed milk  Discharge Pump: DEBP;Personal (Medela, reviewed hand pump in DEBP) WIC Program: Yes  Consult Status Consult Status: Follow-up Date: 12/24/22 Follow-up type: In-patient    Gwenevere Abbot, RN, [place] 12/23/2022, 3:43 PM

## 2022-12-23 NOTE — Anesthesia Postprocedure Evaluation (Signed)
Anesthesia Post Note  Patient: Courtney Holloway  Procedure(s) Performed: CESAREAN SECTION (Abdomen)     Patient location during evaluation: Mother Baby Anesthesia Type: Epidural Level of consciousness: oriented and awake and alert Pain management: pain level controlled Vital Signs Assessment: post-procedure vital signs reviewed and stable Respiratory status: spontaneous breathing and respiratory function stable Cardiovascular status: blood pressure returned to baseline and stable Postop Assessment: no headache, no backache, no apparent nausea or vomiting and able to ambulate Anesthetic complications: no  No notable events documented.  Last Vitals:  Vitals:   12/23/22 1130 12/23/22 1212  BP: (!) 130/92 130/71  Pulse: (!) 109 (!) 107  Resp: 15 16  Temp:  36.9 C  SpO2: 98% 96%    Last Pain:  Vitals:   12/23/22 1212  TempSrc: Oral  PainSc: 6    Pain Goal: Patients Stated Pain Goal: 0 (12/23/22 0800)  LLE Motor Response: Purposeful movement (12/23/22 1145) LLE Sensation: Tingling (12/23/22 1145) RLE Motor Response: Purposeful movement (12/23/22 1145) RLE Sensation: Tingling (12/23/22 1145)     Epidural/Spinal Function Cutaneous sensation: Normal sensation (12/23/22 1212), Patient able to flex knees: Yes (12/23/22 1212), Patient able to lift hips off bed: Yes (12/23/22 1212), Back pain beyond tenderness at insertion site: No (12/23/22 1212), Progressively worsening motor and/or sensory loss: No (12/23/22 1212)  Barnet Glasgow

## 2022-12-23 NOTE — Anesthesia Postprocedure Evaluation (Signed)
Anesthesia Post Note  Patient: Courtney Holloway  Procedure(s) Performed: CESAREAN SECTION (Abdomen)     Patient location during evaluation: PACU Anesthesia Type: Epidural Level of consciousness: awake and alert Pain management: pain level controlled Vital Signs Assessment: post-procedure vital signs reviewed and stable Respiratory status: spontaneous breathing, nonlabored ventilation, respiratory function stable and patient connected to nasal cannula oxygen Cardiovascular status: blood pressure returned to baseline and stable Postop Assessment: no apparent nausea or vomiting Anesthetic complications: no  No notable events documented.  Last Vitals:  Vitals:   12/23/22 1130 12/23/22 1212  BP: (!) 130/92 130/71  Pulse: (!) 109 (!) 107  Resp: 15 16  Temp:  36.9 C  SpO2: 98% 96%    Last Pain:  Vitals:   12/23/22 1212  TempSrc: Oral  PainSc: 6    Pain Goal: Patients Stated Pain Goal: 0 (12/23/22 0800)  LLE Motor Response: Purposeful movement (12/23/22 1145) LLE Sensation: Tingling (12/23/22 1145) RLE Motor Response: Purposeful movement (12/23/22 1145) RLE Sensation: Tingling (12/23/22 1145)     Epidural/Spinal Function Cutaneous sensation: Normal sensation (12/23/22 1212), Patient able to flex knees: Yes (12/23/22 1212), Patient able to lift hips off bed: Yes (12/23/22 1212), Back pain beyond tenderness at insertion site: No (12/23/22 1212), Progressively worsening motor and/or sensory loss: No (12/23/22 1212)  Barnet Glasgow

## 2022-12-23 NOTE — Progress Notes (Addendum)
Labor Progress Note  Courtney Holloway is a 22 y.o. G1P0 at [redacted]w[redacted]d presented for IOL 2/2 GDMA2  S: Epidural in place. Went to patient room because of recurrent decelerations   O:  BP (!) 129/94   Pulse 97   Temp (!) 97.4 F (36.3 C) (Axillary)   Resp 16   Ht 5\' 1"  (1.549 m)   Wt 124.5 kg   LMP 04/01/2022 (Exact Date)   SpO2 100%   BMI 51.87 kg/m  EFM: 140 bpm/Moderate variability/ 10x10 accels/ recurrent decels  CVE: Dilation: 6 Effacement (%): 80 Cervical Position: Posterior Station: -2 Presentation: Vertex Exam by:: Dr. Caron [name]   A&P: 22 y.o. G1P0 [redacted]w[redacted]d  here for IOL as above  #Labor: No progression from previous check. Fluid bolus given and pitocin stopped due to recurrent decelerations. FHR improves, will monitor and restart pit in 30 min if FHT continue to improve  #Pain: Epidural in place #FWB: CAT 2- addressed above  #GBS positive - PCN #A2GDM: CBG q4h. CTM #HSV: no genital lesions on speculum and visual vaginal exam  Discussed that there is cesarean delivery is not indicated at this time but consented for it incase FHT worsens and there is a need.   The risks of cesarean section discussed with the patient included but were not limited to: bleeding which may require transfusion or reoperation; infection which may require antibiotics; injury to bowel, bladder, ureters or other surrounding organs; injury to the fetus; need for additional procedures including hysterectomy in the event of a life-threatening hemorrhage; placental abnormalities with subsequent pregnancies, incisional problems, thromboembolic phenomenon and other postoperative/anesthesia complications. The patient concurred with the proposed plan, giving informed written consent for the procedure. Patient NPO status waived given urgency of case. Anesthesia and OR aware. Preoperative prophylactic antibiotics and SCDs ordered on call to the OR.   [name], MD 12/23/22  2:14 AM

## 2022-12-24 ENCOUNTER — Encounter: Payer: Self-pay | Admitting: Obstetrics and Gynecology

## 2022-12-24 ENCOUNTER — Encounter (HOSPITAL_COMMUNITY): Payer: Self-pay | Admitting: Family Medicine

## 2022-12-24 LAB — BIRTH TISSUE RECOVERY COLLECTION (PLACENTA DONATION)

## 2022-12-24 LAB — CBC
HCT: 23.5 % — ABNORMAL LOW (ref 36.0–46.0)
Hemoglobin: 7.8 g/dL — ABNORMAL LOW (ref 12.0–15.0)
MCH: 25.4 pg — ABNORMAL LOW (ref 26.0–34.0)
MCHC: 33.2 g/dL (ref 30.0–36.0)
MCV: 76.5 fL — ABNORMAL LOW (ref 80.0–100.0)
Platelets: 273 10*3/uL (ref 150–400)
RBC: 3.07 MIL/uL — ABNORMAL LOW (ref 3.87–5.11)
RDW: 16.8 % — ABNORMAL HIGH (ref 11.5–15.5)
WBC: 14.6 10*3/uL — ABNORMAL HIGH (ref 4.0–10.5)
nRBC: 0 % (ref 0.0–0.2)

## 2022-12-24 LAB — GLUCOSE, CAPILLARY: Glucose-Capillary: 72 mg/dL (ref 70–99)

## 2022-12-24 MED ORDER — SODIUM CHLORIDE 0.9 % IV SOLN
500.0000 mg | Freq: Once | INTRAVENOUS | Status: AC
  Administered 2022-12-24: 500 mg via INTRAVENOUS
  Filled 2022-12-24: qty 500

## 2022-12-24 MED ORDER — FUROSEMIDE 20 MG PO TABS
20.0000 mg | ORAL_TABLET | Freq: Every day | ORAL | Status: DC
  Administered 2022-12-24 – 2022-12-26 (×3): 20 mg via ORAL
  Filled 2022-12-24 (×3): qty 1

## 2022-12-24 MED ORDER — LACTATED RINGERS IV BOLUS
500.0000 mL | Freq: Once | INTRAVENOUS | Status: AC
  Administered 2022-12-24: 500 mL via INTRAVENOUS

## 2022-12-24 NOTE — Progress Notes (Addendum)
POSTPARTUM PROGRESS NOTE  Post Partum Day #1  Subjective:  Courtney Holloway is a 22 y.o. G1P1001 s/p pLTCS at [redacted]w[redacted]d for non-reassuring fetal status.  No acute events overnight.  Pt denies problems with ambulating. Foley still in place, good urine output. Minimal PO intake due to nausea. Pain is moderately controlled, having instances of breakthrough pain at incision site. Discussed utilizing pain medication prior to severe pain onset.  She has had flatus. She has not had bowel movement.  Lochia Moderate.   Objective: Blood pressure 112/72, pulse 82, temperature 98 F (36.7 C), temperature source Oral, resp. rate 18, height 5\' 1"  (1.549 m), weight 124.5 kg, last menstrual period 04/01/2022, SpO2 100 %, unknown if currently breastfeeding.  Physical Exam:  General: alert, cooperative and no distress Chest: no respiratory distress Heart:regular rate, distal pulses intact Abdomen: soft, nontender,  Uterine Fundus: firm, appropriately tender DVT Evaluation: No calf swelling or tenderness Extremities: Trace peripheral edema Skin: warm, dry; incision clean/dry/intact  Recent Labs    12/22/22 0754 12/24/22 0349  HGB 9.7* 7.8*  HCT 29.6* 23.5*    Assessment/Plan: Courtney MADURO is a 22 y.o. G1P1001 s/p pLTCS at [redacted]w[redacted]d for non-reassuring fetal status.  PPD#1 - Pain at incision site that is moderately controlled with episodes of breakthrough pain. Encouraged using PRN pain medications prior to severe pain onset. Trace edema with some borderline BPs, Lasix x 5 days. Contraception: Nexplanon Feeding: Bottle feeding. Expressing colostrum for baby but is not interested in breast feeding. Baby in the NICU for sugar control. Dispo: Plan for discharge 1/11 or 1/12 Post-op anemia: Hgb 7.8 (baseline 9.7). IV Venofer x 1.    LOS: 2 days   Colletta Maryland, MD 12/24/2022, 7:24 AM   GME ATTESTATION:  I saw and evaluated the patient. I agree with the findings and the plan of care as documented  in the resident's note. I have made changes to documentation as necessary.  Gerlene Fee, DO OB Fellow, [place] [facility] [place] 12/24/2022, 9:46 AM

## 2022-12-24 NOTE — Lactation Note (Signed)
This note was copied from a baby's chart.  NICU Lactation Consultation Note  Patient Name: Courtney Holloway Date: 12/24/2022 Age:20 hours  Subjective Reason for consult: Follow-up assessment; 1st time breastfeeding; Primapara; Other (Comment); Maternal endocrine disorder; Early term 40-38.6wks; NICU baby (teen pregnancy)  Visited with family of 76 21/9 weeks old (adjusted) NICU female; Courtney Holloway is a P1 and reports she's been trying to take baby to breast while at the hospital just to see if he latches but her plan is to exclusively pump and bottle feed. She voiced her breasts have been leaking and that they have become more sensitive since baby's birth. She's been pumping and voiced she's getting small amounts of colostrum already, praised her for her efforts. Noticed she hasn't been pumping consistently, explained the importance of consistent pumping for the onset of lactogenesis II and to protect her supply; she voiced understanding. Reviewed pumping schedule, lactogenesis II, benefits of STS care for newborn hypoglycemia and anticipatory guidelines.  Objective Infant data: Mother's Current Feeding Choice: Breast Milk and Formula  Infant feeding assessment Scale for Readiness: 1 Scale for Quality: 2  Maternal data: G1P1001  C-Section, Low Transverse Significant Breast History:: moderate breast changes Current breast feeding challenges:: NICU admission Pumping frequency: 3 times/24 hours Pumped volume: 5 mL Flange Size: 21 Risk factor for low milk supply:: primipara, infant separation, blood loss of 393 cc., GDM WIC Program: Yes Pump: DEBP, Personal (Medela, reviewed hand pump in DEBP)  Assessment Infant: LATCH Score: 8  Feeding Status: Ad lib  Maternal: Milk volume: Normal  Intervention/Plan Interventions: Breast feeding basics reviewed; DEBP; Education Tools: Pump; Flanges Pump Education: Setup, frequency, and cleaning; Milk Storage  Plan of  care: Encouraged bilateral pumping every 3 hours, ideally 8 pumping sessions/24 hours Hand expression and breast massage were also encouraged prior pumping Parents will continue working on bottle feedings per feeding choice on admission  FOB present. All questions and concerns answered, family to contact [facility] services PRN.  Consult Status: NICU follow-up  NICU Follow-up type: Maternal D/C visit; Verify onset of copious milk; Verify absence of engorgement   Kaiyden Simkin S Franco Duley 12/24/2022, 4:39 PM

## 2022-12-25 ENCOUNTER — Encounter: Payer: Self-pay | Admitting: General Practice

## 2022-12-25 LAB — GLUCOSE, CAPILLARY: Glucose-Capillary: 76 mg/dL (ref 70–99)

## 2022-12-25 MED ORDER — SERTRALINE HCL 50 MG PO TABS
50.0000 mg | ORAL_TABLET | Freq: Every day | ORAL | Status: DC
  Administered 2022-12-25 – 2022-12-26 (×2): 50 mg via ORAL
  Filled 2022-12-25 (×2): qty 1

## 2022-12-25 MED ORDER — NIFEDIPINE ER OSMOTIC RELEASE 30 MG PO TB24
30.0000 mg | ORAL_TABLET | Freq: Every day | ORAL | Status: DC
  Administered 2022-12-25 – 2022-12-26 (×2): 30 mg via ORAL
  Filled 2022-12-25 (×2): qty 1

## 2022-12-25 MED ORDER — HYDROXYZINE HCL 25 MG PO TABS
25.0000 mg | ORAL_TABLET | Freq: Four times a day (QID) | ORAL | Status: DC | PRN
  Administered 2022-12-25: 25 mg via ORAL
  Filled 2022-12-25: qty 1

## 2022-12-25 NOTE — Clinical Social Work Maternal (Signed)
CLINICAL SOCIAL WORK MATERNAL/CHILD NOTE  Patient Details  Name: Courtney Holloway MRN: [id number] Date of Birth: May 28, 2001  Date:  12/25/2022  Clinical Social Worker Initiating Note:  Laurey Arrow Date/Time: Initiated:  12/24/22/1500     Child's Name:  Courtney Holloway   Biological Parents:  Mother, Father Gomez Cleverly:  11/21/02 [phone])   Need for Interpreter:  None   Reason for Referral:  Behavioral Health Concerns (hx anx/dep)   Address:  [address] [place] [address]    Phone number:  [phone] (home)     Additional phone number:   Household Members/Support Persons (HM/SP):       HM/SP Name Relationship DOB or Age  HM/SP -1        HM/SP -2        HM/SP -3        HM/SP -4        HM/SP -5        HM/SP -6        HM/SP -7        HM/SP -8          Natural Supports (not living in the home):  Parent, Extended Family, Friends, Spouse/significant other   Professional Supports: Transport planner (MOB recieves outpatient counseling at [facility]   Employment: Unemployed   Type of Work:     Education:  Attending college   Homebound arranged:    Museum/gallery curator Resources:  Medicaid   Other Resources:  Physicist, medical  , [place] Considerations Which May Impact Care:  none reported  Strengths:  Ability to meet basic needs  , Lexicographer chosen, Home prepared for child  , Understanding of illness, Psychotropic Medications, Compliance with medical plan     Psychotropic Medications:  Zoloft      Pediatrician:    [place] area  Pediatrician List:   [facility] [place]      Pediatrician Fax Number:    Risk Factors/Current Problems:  Mental Health Concerns     Cognitive State:  Able to Concentrate  , Alert  , Insightful  , Linear Thinking     Mood/Affect:  Interested  , Comfortable  , Calm   , Happy  , Bright  , Relaxed     CSW Assessment: CSW met with MOB in room 507 to complete an assessment for MH hx When CSW arrived, MOB was resting in bed and FOB was in the recliner watching TV.  CSW explained CSW's role and with MOB's permission CSW asked FOB to leave in order to assess MOB in private. MOB was polite, easy to  engage, and appeared to be forthcoming.   CSW asked about MOB's MH hx and MOB shared she was dx around age 22 with anxiety and depression. Per MOB her mental health hx is all related to her traumatic upbringing and environmental exposure. MOB shared she is currently taking Zoloft her symtoms are managed. CSW asked about MOB's most recent inpatient admission.  MOB openly communicated that she was emotional due to her inconsistent relationship with FOB.  MOB shared that she was not suicidal and she was not having suicidal ideations however she felt like her emotions were increasing and she wanted to be medicated.  Per MOB she felt an urgent need to be seen and her regular doctor's office was  closed so she went to the ED.  MOB stated since she has been regulated on her medications her symptoms have decreased.   CSW provided education regarding the baby blues period vs. perinatal mood disorders, discussed treatment and gave resources for mental health follow up if concerns arise.  CSW recommends self-evaluation during the postpartum time period using the New Mom Checklist from Postpartum Progress and encouraged MOB to contact a medical professional if symptoms are noted at any time.  MOB presented with insight and awareness and did not demonstrate any acute MH symtoms.  MOB also shared that she has a good support team and she feels comfortable seeking help if needed. MOB will continue to   MOB communicated feeling well informed by NICU team and she denied having any questions or concerns. MOB also denied any barriers to visiting with infant post her discharge.  Per MOB, she has all  essential items to care for infant including a new car seat and a bassinet.   CSW provided review of Sudden Infant Death Syndrome (SIDS) precautions.    CSW will continue to offer resources and supports to family while infant remains in NICU.    CSW Plan/Description:  Psychosocial Support and Ongoing Assessment of Needs, Sudden Infant Death Syndrome (SIDS) Education, Perinatal Mood and Anxiety Disorder (PMADs) Education, Other Patient/Family Education, Other Information/Referral to [employer], MSW, [employer] Clinical Social Work [phone]   Dimple Nanas, LCSW 12/25/2022, 10:09 AM

## 2022-12-25 NOTE — Progress Notes (Signed)
Subjective: Postpartum Day #2: Cesarean Delivery Patient reports incisional pain, tolerating PO, + BM, and no problems voiding. Pumping for NICU infant; plans Nexplanon outpatient; s/p Venofer yesterday- no s/s anemia  Objective: Vital signs in last 24 hours: chart review shows BPs overnight that meet criteria for mild range HTN Temp:  [97.6 F (36.4 C)-98.5 F (36.9 C)] 98.3 F (36.8 C) (01/11 0510) Pulse Rate:  [93-115] 113 (01/11 0510) Resp:  [18] 18 (01/11 0510) BP: (121-130)/(86-92) 121/92 (01/11 0510) SpO2:  [97 %-98 %] 98 % (01/11 0510)  Physical Exam:  General: alert, cooperative, and no distress Lochia: appropriate Uterine Fundus: firm Incision: honeycomb intact, dry DVT Evaluation: No evidence of DVT seen on physical exam.  Recent Labs    12/24/22 0349  HGB 7.8*  HCT 23.5*   Fasting CBG 1/10: 72  Assessment/Plan: Status post Cesarean section. Doing well postoperatively.  Continue current care with the addition of ProcardiaXL 30mg  due to ppHTN (meds to beds and PP babyrx ordered); already on Lasix; plan for d/c tomorrow due to infant in [place], CNM 12/25/2022, 8:52 AM

## 2022-12-25 NOTE — Lactation Note (Signed)
This note was copied from a baby's chart.  NICU Lactation Consultation Note  Patient Name: Boy Katalaya Beel ZDGLO'V Date: 12/25/2022 Age:20 hours   Subjective Reason for consult: Follow-up assessment; Primapara; 1st time breastfeeding; NICU baby; Exclusive pumping and bottle feeding; Early term 37-38.6wks  Lactation followed up with Ms. Thurnell Garbe. I assisted with reverse pressure softening on both breasts and pumping. Oluwatomisin is expressing interest in primarily pumping, but I provided education on how to follow infant feedings cue to latch, and I offered to support her in latching infant. I recommended that she pump q3 hours and to hand express and very gently massage breasts a few times a day.  Shironda's milk is transitioning today, and she is feeling that her breasts are fuller, but I did not observe engorgement.  Objective Infant data: Mother's Current Feeding Choice: Breast Milk and Formula  Infant feeding assessment Scale for Readiness: 1 Scale for Quality: 2  Maternal data: G1P1001  C-Section, Low Transverse Significant Breast History:: moderate breast changes  Current breast feeding challenges:: NICU admission  Pumping frequency: 3 times/24 hours Pumped volume: 5 mL Flange Size: 21  Risk factor for low milk supply:: P1   WIC Program: Yes Pump: Personal (Medela electric)  Assessment Infant: No data recorded Feeding Status: Ad lib   Maternal: Milk volume: Normal   Intervention/Plan Interventions: Breast feeding basics reviewed; Skin to skin; Hand express; Education; DEBP  Tools: Pump; Flanges Pump Education: Setup, frequency, and cleaning; Milk Storage  Plan: Consult Status: NICU follow-up  NICU Follow-up type: New admission follow up; Verify onset of copious milk; Verify absence of engorgement    Lenore Manner 12/25/2022, 10:55 AM

## 2022-12-25 NOTE — Progress Notes (Signed)
Pt spoke with RN about current personal stressors and coping with infant NICU admission, pt.  RN asked pt about her medications during pregnancy, she informed nurse she was taking Zoloft but had not had it since before admission and stated she would like to restart it.  RN contacted M. Bhambri CNM about pt current feelings and desire to take the Zoloft that was previously prescribed during pregnancy.  Zoloft was administered per CNM and prn med ordered for acute anxiety.  RN encoraged pt to order meal and get some rest.  Pt aware of the ordered prn if needed.

## 2022-12-26 ENCOUNTER — Other Ambulatory Visit (HOSPITAL_COMMUNITY): Payer: Self-pay

## 2022-12-26 ENCOUNTER — Inpatient Hospital Stay (HOSPITAL_COMMUNITY)
Admission: AD | Admit: 2022-12-26 | Discharge: 2022-12-26 | Disposition: A | Discharge status: Home / Self Care | Payer: Medicaid Other | Attending: Obstetrics and Gynecology | Admitting: Obstetrics and Gynecology

## 2022-12-26 ENCOUNTER — Encounter (HOSPITAL_COMMUNITY): Payer: Self-pay | Admitting: Obstetrics and Gynecology

## 2022-12-26 DIAGNOSIS — O9089 Other complications of the puerperium, not elsewhere classified: Secondary | ICD-10-CM | POA: Insufficient documentation

## 2022-12-26 DIAGNOSIS — O165 Unspecified maternal hypertension, complicating the puerperium: Secondary | ICD-10-CM | POA: Insufficient documentation

## 2022-12-26 DIAGNOSIS — O0993 Supervision of high risk pregnancy, unspecified, third trimester: Secondary | ICD-10-CM

## 2022-12-26 DIAGNOSIS — R519 Headache, unspecified: Secondary | ICD-10-CM

## 2022-12-26 LAB — GLUCOSE, CAPILLARY
Glucose-Capillary: 61 mg/dL — ABNORMAL LOW (ref 70–99)
Glucose-Capillary: 87 mg/dL (ref 70–99)

## 2022-12-26 MED ORDER — HYDROXYZINE HCL 25 MG PO TABS
25.0000 mg | ORAL_TABLET | Freq: Four times a day (QID) | ORAL | 0 refills | Status: DC | PRN
  Filled 2022-12-26: qty 30, 8d supply, fill #0

## 2022-12-26 MED ORDER — FUROSEMIDE 20 MG PO TABS
20.0000 mg | ORAL_TABLET | Freq: Every day | ORAL | 0 refills | Status: DC
  Filled 2022-12-26: qty 5, 5d supply, fill #0

## 2022-12-26 MED ORDER — EXCEDRIN MIGRAINE 250-250-65 MG PO TABS: 2.0000 | ORAL_TABLET | Freq: Four times a day (QID) | ORAL | 0 refills | Status: DC | PRN

## 2022-12-26 MED ORDER — NIFEDIPINE ER 30 MG PO TB24
30.0000 mg | ORAL_TABLET | Freq: Every day | ORAL | 1 refills | Status: DC
  Filled 2022-12-26: qty 30, 30d supply, fill #0

## 2022-12-26 MED ORDER — OXYCODONE HCL 5 MG PO TABS
5.0000 mg | ORAL_TABLET | ORAL | 0 refills | Status: DC | PRN
  Filled 2022-12-26: qty 10, 1d supply, fill #0

## 2022-12-26 MED ORDER — ACETAMINOPHEN-CAFFEINE 500-65 MG PO TABS
2.0000 | ORAL_TABLET | Freq: Once | ORAL | Status: AC
  Administered 2022-12-26: 2 via ORAL
  Filled 2022-12-26: qty 2

## 2022-12-26 MED ORDER — IBUPROFEN 600 MG PO TABS
600.0000 mg | ORAL_TABLET | Freq: Four times a day (QID) | ORAL | 0 refills | Status: DC
  Filled 2022-12-26: qty 30, 8d supply, fill #0

## 2022-12-26 NOTE — Progress Notes (Signed)
Patient ID: ANAHLA BEVIS, female   DOB: Sep 26, 2001, 22 y.o.   MRN: [id number]  ALAHNI VARONE [id number] Postpartum Day 3 S/P Primary Cesarean Section due to [facility]  Subjective: Patient up ad lib, denies syncope or dizziness. Reports consuming regular diet without issues and denies N/V. Patient reports no bowel movement, but is passing flatus.  Denies issues with urination and reports bleeding is "like a period."  Patient is bottle feeding and reports infant is doing well in the NICU, but is not to be discharged today or tomorrow.  Desires Nexplanon for postpartum contraception, but would like outpatient.  Pain is being appropriately managed with use of tylenol, motrin, and oxycodone.   Objective: Temp:  [97.9 F (36.6 C)-98.2 F (36.8 C)] 97.9 F (36.6 C) (01/12 0432) Pulse Rate:  [98-103] 98 (01/12 0432) Resp:  [18-19] 18 (01/12 0432) BP: (127-133)/(84-91) 128/84 (01/12 0432) SpO2:  [97 %-98 %] 98 % (01/11 2043)  Recent Labs    12/24/22 0349  HGB 7.8*  HCT 23.5*  WBC 14.6*    Physical Exam:  General: alert, cooperative, and no distress Mood/Affect: Appropriate/Appropriate Lungs: clear to auscultation, no wheezes, rales or rhonchi, symmetric air entry.  Heart: normal rate and regular rhythm. Breast: not examined. Abdomen:  + bowel sounds, Soft Incision: Honeycomb dressing CDI Uterine Fundus: firm Lochia: appropriate Skin: Warm, Dry. DVT Evaluation: No evidence of DVT seen on physical exam. Calf/Ankle edema is present. JP drain:   None  Assessment Post Operative Day 3 S/P Primary C/S Normal Involution Asymptomatic Anemia  Plan: -Reviewed postpartum care and follow up. -Informed of appt in 4 days for BP and incision check.  Strongly encouraged to go as patient expresses desire to remain at infant bedside.   -Discussed need to care for oneself in order to provide optimal care for her infant upon discharge. -Instructed to leave honeycomb dressing in place until  incision check appt.  -Reviewed incision care and precautions.  -Discussed sending with limited script of oxycodone for pain management. -Iron supplement every other day. -All prescriptions sent to transitional pharmacy. -Discharge today.  -L&D Team to be updated on patient status  Maryann Conners MSN, CNM 12/26/2022, 10:41 AM

## 2022-12-26 NOTE — Progress Notes (Signed)
CSW met with MOB at infant's bedside in room 324 to complete an assessment for Edinburgh 15. When CSW arrived, MOB was observing infant while he was asleep in his isolette. FOB was also present and CSW asked him to leave in order to assess MOB in private; FOB left without incident. CSW reviewed MOB's Edinburgh Score (15). Per MOB, she answered the Edinburgh questions based on her emotions during pregnancy as oppose to how she felt over the last 7 days. CSW denied SI, HI, and DV when assessed for safety.  Per MOB she plans to follow up with behavioral health specialist with [facility] for women and she also will be getting weekly in-home visits from the [facility].   CSW will continue to offer resources and supports to family while infant remains in NICU.    Noelene Gang Boyd-Gilyard, MSW, LCSW Clinical Social Work [phone] 

## 2022-12-26 NOTE — MAU Provider Note (Signed)
History     CSN: [id number]  Arrival date and time: 12/26/22 1920   Event Date/Time   First Provider Initiated Contact with Patient 12/26/22 2053      Chief Complaint  Patient presents with   Headache   HPI [name] is a 22 y.o. year old G45P1001 female at 2 days PP s/p Cesarean delivery who presents to MAU reporting she was d/c'd home today at 1430. She and her boyfriend went to [employer] after being d/c'd. They then went back to the NICU to be with the baby. She has not been home since her d/c. She reports her hands and feet are swollen and she has had a H/A since being d/c'd home. She has taken 1 Ibuprofen and the ocycodone that was prescribed to her at d/c. Since her H/A was not relieved, the NICU advised her to come to MAU to be evaluated.  OB History     Gravida  1   Para  1   Term  1   Preterm      AB      Living  1      SAB      IAB      Ectopic      Multiple  0   Live Births  1           Past Medical History:  Diagnosis Date   Anxiety    Asthma    Depression    Diabetes mellitus during pregnancy in second trimester 09/15/2022   DMDD (disruptive mood dysregulation disorder) ([place]) 12/28/2015   Failed vision screen 11/21/2014   GAD (generalized anxiety disorder)    Gestational diabetes    Headache(784.0)    HSV-1 (herpes simplex virus 1) infection 08/02/2020   Obesity 11/21/2014   Vision abnormalities    wears glasses    Past Surgical History:  Procedure Laterality Date   CESAREAN SECTION N/A 12/23/2022   Procedure: CESAREAN SECTION;  Surgeon: Truett Mainland, DO;  Location: [facility] LD ORS;  Service: Obstetrics;  Laterality: N/A;   NO PAST SURGERIES      Family History  Problem Relation Age of Onset   Diabetes Mother    Bipolar disorder Mother    Schizophrenia Mother    Multiple sclerosis Mother    Diabetes Father    Bipolar disorder Maternal Grandmother    Alcohol abuse Maternal Grandmother     Social History    Tobacco Use   Smoking status: Former    Types: E-cigarettes    Quit date: 04/13/2022    Years since quitting: 0.7   Smokeless tobacco: Never  Vaping Use   Vaping Use: Former  Substance Use Topics   Alcohol use: Not Currently   Drug use: Not Currently    Types: Marijuana    Comment: 2021    Allergies:  Allergies  Allergen Reactions   Other Anaphylaxis and Itching    Seasonal allergies, dog fur, certain detergents  Pt. Reports mother has used "epi-pen" for past allergies.    Pineapple Anaphylaxis   Fish-Derived Products Diarrhea, Hives and Itching   Zofran [Ondansetron Hcl] Itching    Medications Prior to Admission  Medication Sig Dispense Refill Last Dose   acetaminophen (TYLENOL) 500 MG tablet Take 2 tablets (1,000 mg total) by mouth every 6 (six) hours as needed for fever, mild pain or headache. 30 tablet 0 12/26/2022   furosemide (LASIX) 20 MG tablet Take 1 tablet (20 mg total) by mouth daily.  5 tablet 0 12/26/2022   hydrOXYzine (ATARAX) 25 MG tablet Take 1 tablet (25 mg total) by mouth every 6 (six) hours as needed for anxiety. 30 tablet 0 12/26/2022 at 1920   NIFEdipine (ADALAT CC) 30 MG 24 hr tablet Take 1 tablet (30 mg total) by mouth daily. 30 tablet 1 12/26/2022   oxyCODONE (OXY IR/ROXICODONE) 5 MG immediate release tablet Take 1-2 tablets (5-10 mg total) by mouth every 4 (four) hours as needed for moderate pain. 10 tablet 0 12/26/2022 at 1920   sertraline (ZOLOFT) 50 MG tablet Take 1 tablet (50 mg total) by mouth daily. 30 tablet 3 12/26/2022   valACYclovir (VALTREX) 500 MG tablet Take 1 tablet (500 mg total) by mouth 2 (two) times daily. 60 tablet 6 Past Week   EPINEPHrine (EPIPEN 2-PAK) 0.3 mg/0.3 mL IJ SOAJ injection Inject 0.3 mg into the muscle as needed for anaphylaxis. 1 each 1    ibuprofen (ADVIL) 600 MG tablet Take 1 tablet (600 mg total) by mouth every 6 (six) hours. 30 tablet 0  at 1920   Prenatal Vit-Fe Fumarate-FA (PREPLUS) 27-1 MG TABS Take 1 tablet by mouth  daily. (Patient not taking: Reported on 12/16/2022) 30 tablet 13     Review of Systems  Constitutional: Negative.   HENT: Negative.    Eyes: Negative.   Respiratory: Negative.    Cardiovascular:  Positive for leg swelling.  Endocrine: Negative.   Genitourinary:  Positive for vaginal bleeding (normal bleeding after delivery).  Musculoskeletal: Negative.   Skin: Negative.   Allergic/Immunologic: Negative.   Neurological:  Positive for headaches.  Hematological: Negative.   Psychiatric/Behavioral: Negative.     Physical Exam   Blood pressure 130/81, pulse 99, temperature 98.8 F (37.1 C), temperature source Oral, resp. rate 18, height 5\' 1"  (1.549 m), weight 121.7 kg, last menstrual period 04/01/2022, unknown if currently breastfeeding.  Physical Exam Vitals and nursing note reviewed.  Constitutional:      Appearance: Normal appearance. She is obese.  Skin:    General: Skin is warm and dry.  Neurological:     Mental Status: She is alert and oriented to person, place, and time.  Psychiatric:        Mood and Affect: Mood normal.        Behavior: Behavior normal.        Thought Content: Thought content normal.        Judgment: Judgment normal.     MAU Course  Procedures  MDM Excedrin Tension Headache 2 tablets -- resolved H/A  Assessment and Plan  1. Postpartum headache - Rx: Excedrin Tension Headache 2 tablets every 6 hours prn h/a - Discharge patient - Keep appt with [facility]W for wound check and BP check - Patient verbalized an understanding of the plan of care and agrees.     Laury Deep, CNM 12/26/2022, 9:54 PM

## 2022-12-26 NOTE — MAU Note (Addendum)
Pt says she del on Tuesday 12-23-2022- by C/S  D/C today-at 230pm -went to [place] with boyfriend then to NICU  But Baby is in NICU - for sugars  Says feet and hands swollen Felt dizzy Has H/A - since D/C - NICU - told pt to come here While in NICU - Took  1 tab Ibuprofen at 720pm, and BP med and 1 oxycodone ( meds came from pharmacy- before D/C )

## 2022-12-29 ENCOUNTER — Telehealth (HOSPITAL_COMMUNITY): Payer: Self-pay | Admitting: *Deleted

## 2022-12-29 ENCOUNTER — Ambulatory Visit: Payer: Self-pay

## 2022-12-29 DIAGNOSIS — Z1331 Encounter for screening for depression: Secondary | ICD-10-CM

## 2022-12-29 LAB — THC-COOH, CORD QUALITATIVE: THC-COOH, Cord, Qual: NOT DETECTED ng/g

## 2022-12-29 NOTE — Lactation Note (Signed)
This note was copied from a baby's chart.  NICU Lactation Consultation Note  Patient Name: Courtney Holloway Date: 12/29/2022 Age:22 years  Subjective Reason for consult: Follow-up assessment; 1st time breastfeeding; Primapara; NICU baby; Exclusive pumping and bottle feeding; Early term 37-38.6wks; Maternal endocrine disorder; Other (Comment) (teen pregnancy)  Visited with family of 78 days old ETI NICU female; Ms. Schamberger reports she's still pumping but not consistently; she endorses pain/discomfort at the breast that are consistent with OOL (her milk is in) and onset of engorgement. Provided ice packs per her request; her plan is still exclusively pumping and bottle feeding for baby "[name]". Parents are taking baby home today. Reviewed discharge education, engorgement prevention/treatment and the importance of consistent pumping to protect her supply. FOB present. All questions and concerns answered, family to contact [facility] services PRN.  Objective Infant data: Mother's Current Feeding Choice: Breast Milk and Formula  Infant feeding assessment Scale for Readiness: 1 Scale for Quality: 2  Maternal data: G1P1001  C-Section, Low Transverse Pumping frequency: 2 times/24 hours Pumped volume: 90 mL (90 to 120 ml) Flange Size: 21 WIC Program: Yes Pump: Personal Materials engineer)  Assessment Infant: In NICU  Maternal: Milk volume: Normal  Intervention/Plan Interventions: Breast feeding basics reviewed; DEBP; Ice; Education Tools: Pump; Flanges; Coconut oil Pump Education: Setup, frequency, and cleaning; Milk Storage  Plan of care: Consult Status: Complete   Burlin Mcnair Francene Boyers 12/29/2022, 4:43 PM

## 2022-12-29 NOTE — Telephone Encounter (Signed)
Inpatient EPDS = 15. CSW consult completed during hospital stay. IBH Referral made now. Dr. Dione Plover notified via chart.  Odis Hollingshead, RN 12-29-2022 at 10:03am

## 2022-12-30 ENCOUNTER — Other Ambulatory Visit: Payer: Self-pay

## 2022-12-30 ENCOUNTER — Ambulatory Visit (INDEPENDENT_AMBULATORY_CARE_PROVIDER_SITE_OTHER): Payer: Medicaid Other | Admitting: *Deleted

## 2022-12-30 VITALS — BP 126/84 | HR 105 | Wt 255.4 lb

## 2022-12-30 DIAGNOSIS — K5900 Constipation, unspecified: Secondary | ICD-10-CM

## 2022-12-30 DIAGNOSIS — Z4889 Encounter for other specified surgical aftercare: Secondary | ICD-10-CM

## 2022-12-30 MED ORDER — DOCUSATE SODIUM 100 MG PO CAPS: 100.0000 mg | ORAL_CAPSULE | Freq: Two times a day (BID) | ORAL | 0 refills | Status: DC

## 2022-12-30 NOTE — Progress Notes (Signed)
Here for incision check s/p C/S 12/23/22.  Honeycomb dressing intact. Removed without difficulty. Incision CDI without redness, edema or swelling. Reviewed wound care. Patient c/o still having pain and c/o pain above incison. No edema or redness or hardness palpated. Advised to take ibuprofen every 6 hours as needed and may alternate with tylenol as needed. She also has a few oxycodone left and I explained we do not refill unless issue. Also c/o headache=7 that started yesterday without relief after ibuprofen x2. She denies visual disturbances, has 1+ edema to feet and ankles. C/o hemorrhoid pain. Reviewed OTC measures she can take for hemorrhoids and colace rx sent in to pharmacy. Reviewed BP , assessment, c/o headache and incison pain with Dr. Currie Paris. Agree with plan bp check in one week, pre-eclampsia precautions, altenate tylenol and ibuprofen. Of not patient reports stress of trying to find pediatrician for her baby which was d/c yesterday- Health department care coordinator assisting patient with finding pediatrician and in office to talk with patient today. Staci Acosta

## 2022-12-30 NOTE — Patient Instructions (Signed)
For hemorrhoids : preparation H cream , tucks pads, Anusol Hc, colace

## 2022-12-31 ENCOUNTER — Encounter: Payer: Self-pay | Admitting: Obstetrics and Gynecology

## 2023-01-03 ENCOUNTER — Telehealth: Payer: Medicaid Other | Admitting: Family

## 2023-01-03 DIAGNOSIS — Z30012 Encounter for prescription of emergency contraception: Secondary | ICD-10-CM | POA: Diagnosis not present

## 2023-01-04 MED ORDER — ELLA 30 MG PO TABS: 1.0000 | ORAL_TABLET | Freq: Once | ORAL | 0 refills | Status: AC

## 2023-01-04 NOTE — Progress Notes (Signed)
ELLA has been sent as requested to the pharmacy you listed.  I would recommend using condoms prior to you receiving your birth control.

## 2023-01-04 NOTE — Progress Notes (Signed)
I have spent 5 minutes in review of e-visit questionnaire, review and updating patient chart, medical decision making and response to patient.  ° °Myndi Wamble W Houda Brau, NP ° °  °

## 2023-01-04 NOTE — Addendum Note (Signed)
Addended by: Geryl Rankins on: 01/04/2023 03:34 PM   Modules accepted: Orders

## 2023-01-04 NOTE — Progress Notes (Signed)
Hello, you can get Plan B at any pharmacy if you have had unprotected sex in the last 34 hours. You do not need a prescription. If you are not having any pain you should be fine, but if you are having any pain  I recommend that you be seen in a face to face visit to be evaluated.    NOTE: There will be NO CHARGE for this eVisit   If you are having a true medical emergency please call 911.      For an urgent face to face visit, [place] has eight urgent care centers for your convenience:   NEW!! [place] [place] at [place] Get Driving Directions [phone] [address] [place], [place] [facility] [place] [facility] [place] Get Driving Directions [phone] [place], [place] [address]   [place] [facility] [place] [facility]) Get Driving Directions [phone] [address], [place] [address]  [facility] [place] ([place]) Get Driving Directions [phone] [address] [place],  [place]  [address]  [place] [facility] [place] [facility] - at [address] Get Driving Directions  [phone] [place],  [place] [address]   [facility] Get Driving Directions [phone] [place], [place], [place] [address]   [facility] Get Driving Directions  [phone] [address], [place] [address]   [facility] at [place] Get Driving Directions [phone] [address], [place], [place] [address]  Your MyChart E-visit questionnaire answers were reviewed by a board certified advanced clinical practitioner to complete your personal care plan based on your specific symptoms.  Thank you for using e-Visits.

## 2023-01-05 ENCOUNTER — Encounter: Payer: Self-pay | Admitting: Obstetrics & Gynecology

## 2023-01-06 ENCOUNTER — Ambulatory Visit (INDEPENDENT_AMBULATORY_CARE_PROVIDER_SITE_OTHER): Payer: Medicaid Other

## 2023-01-06 ENCOUNTER — Ambulatory Visit: Payer: Medicaid Other

## 2023-01-06 VITALS — BP 112/78 | HR 94 | Ht 61.0 in | Wt 241.8 lb

## 2023-01-06 DIAGNOSIS — Z4889 Encounter for other specified surgical aftercare: Secondary | ICD-10-CM

## 2023-01-06 DIAGNOSIS — Z0130 Encounter for examination of blood pressure without abnormal findings: Secondary | ICD-10-CM

## 2023-01-06 NOTE — Progress Notes (Signed)
Blood Pressure Check Visit  Courtney Holloway is here for blood pressure check following C-section with labor on 12/23/22. BP today is 112/78. Patient denies any dizzness blurred vision headache shortness of breath peripheral edema. Reviewed with Dr. Dione Plover.  Patient currently taking Nifedipine 30mg  q. 24 hours. Per provider, patient is to continue taking nifedipine as prescribed until 3 days prior to postpartum visit on 01/28/23. Patient instructed on plan above and verbalized understanding with no further questions.    Incision Check Visit  Courtney Holloway is here for incision check following primary c-section on 12/23/22.   Assessment: Incision was clean, dry, and intact. Some scabbing noted at surface of the skin, but patient reports only minor pain with the incision site. No redness, tenderness, pus, or swelling noted.   Education: Reviewed good wound care and s/s of infection with patient.  Patient will follow up post partum visit .  Alinda Money, RN 01/06/2023  12:00 PM

## 2023-01-06 NOTE — Progress Notes (Deleted)
Blood Pressure Check Visit  Courtney Holloway is here for blood pressure check following C-section with labor on 12/23/22. BP today is ***. Patient denies/endorses any {Symptoms; hypertension cc:11129:s:"dizzness","blurred vision","headache","shortness of breath","peripheral edema"}. Reviewed with ***.  Incision Check Visit  Courtney Holloway is here for incision check following primary c-section on 12/23/22.   Assessment:  Education: Reviewed good wound care and s/s of infection with patient.  Patient will follow up {Nurse Visit Follow Up:28038}.  Alinda Money, RN 01/06/2023  8:49 AM

## 2023-01-07 ENCOUNTER — Encounter: Payer: Self-pay | Admitting: Obstetrics & Gynecology

## 2023-01-07 ENCOUNTER — Ambulatory Visit: Payer: Medicaid Other | Admitting: Clinical

## 2023-01-07 NOTE — BH Specialist Note (Signed)
Error, rescheduled.

## 2023-01-08 NOTE — BH Specialist Note (Unsigned)
Integrated Behavioral Health via Telemedicine Visit  01/15/2023 Courtney Holloway [id number]  Number of [place] Clinician visits: 4- Fourth Visit  Session Start time: 3762   Session End time: 8315  Total time in minutes: 39   Referring Provider: Johnston Ebbs, NP Patient/Family location: Home [facility] Provider location: Center for [place] at [facility]  All persons participating in visit: Patient Courtney Holloway and [place]   Types of Service: Individual psychotherapy and Video visit  I connected with Courtney Holloway and/or Courtney Holloway's  n/a  via  Telephone or Video Enabled Telemedicine Application  (Video is Caregility application) and verified that I am speaking with the correct person using two identifiers. Discussed confidentiality: Yes   I discussed the limitations of telemedicine and the availability of in person appointments.  Discussed there is a possibility of technology failure and discussed alternative modes of communication if that failure occurs.  I discussed that engaging in this telemedicine visit, they consent to the provision of behavioral healthcare and the services will be billed under their insurance.  Patient and/or legal guardian expressed understanding and consented to Telemedicine visit: Yes   Presenting Concerns: Patient and/or family reports the following symptoms/concerns: Sleep deprivation, poor appetite, wants to restart breastfeeding, stress of being behind on school assignments (does not want to lose [place] and scholarships) and negative feedback from family; pt requesting information about childcare options and filing taxes.  Duration of problem: Ongoing; Severity of problem:  moderately severe  Patient and/or Family's Strengths/Protective Factors: Sense of purpose  Goals Addressed: Patient will:  Reduce symptoms of: anxiety, depression, insomnia, and stress   Increase knowledge  and/or ability of: healthy habits and stress reduction   Demonstrate ability to: Increase healthy adjustment to current life circumstances and Increase motivation to adhere to plan of care  Progress towards Goals: Ongoing  Interventions: Interventions utilized:  Psychoeducation and/or Health Education, Link to [employer], and Supportive Reflection Standardized Assessments completed: GAD-7 and PHQ 9  Patient and/or Family Response: Patient agrees with treatment plan.   Assessment: Patient currently experiencing Major depressive disorder, recurrent, severe without psychotic features and Generalized anxiety disorder.   Patient may benefit from continued therapeutic interventions.  Plan: Follow up with behavioral health clinician on : Two weeks Behavioral recommendations:  -Continue taking BH medication as prescribed -Begin prioritizing healthy self-care (regular meals, adequate rest; allowing practical help from supportive people) until at least postpartum medical appointment -Consider new mom support group as needed at either www.postpartum.net or www.conehealthybaby.com  -Consider using community resources (on After Visit Summary) to find childcare and free tax preparation Referral(s): [place] (In Clinic) and [employer]:  childcare; tax prep  I discussed the assessment and treatment plan with the patient and/or parent/guardian. They were provided an opportunity to ask questions and all were answered. They agreed with the plan and demonstrated an understanding of the instructions.   They were advised to call back or seek an in-person evaluation if the symptoms worsen or if the condition fails to improve as anticipated.  [name], LCSW     01/14/2023    2:37 PM 10/06/2022   11:43 AM 08/04/2022    2:16 PM 07/02/2022    9:06 AM 01/13/2022   11:38 AM  Depression screen PHQ 2/9  Decreased Interest 3 1 1 3 2   Down, Depressed,  Hopeless 3 1 1 3 3   PHQ - 2 Score 6 2 2 6  5  Altered sleeping 3 1 2 3 3   Tired, decreased energy 3 1 2 3 3   Change in appetite 3 1 2 3 3   Feeling bad or failure about yourself  1 1 2 3 3   Trouble concentrating 3 0 0 0 0  Moving slowly or fidgety/restless 0 0 0 0 0  Suicidal thoughts 0 0 0 0   PHQ-9 Score 19 6 10 18 17       01/14/2023    2:43 PM 10/06/2022   11:43 AM 08/04/2022    2:16 PM 07/02/2022    9:06 AM  GAD 7 : Generalized Anxiety Score  Nervous, Anxious, on Edge 0 1 2 2   Control/stop worrying 3 1 2 2   Worry too much - different things 3 1 2 3   Trouble relaxing 3 1 2 2   Restless 0 0 0 0  Easily annoyed or irritable 1 1 2 3   Afraid - awful might happen 1 1 2 2   Total GAD 7 Score 11 6 12  14

## 2023-01-09 ENCOUNTER — Encounter: Payer: Self-pay | Admitting: Family Medicine

## 2023-01-14 ENCOUNTER — Other Ambulatory Visit: Payer: Self-pay

## 2023-01-14 ENCOUNTER — Encounter: Payer: Self-pay | Admitting: Obstetrics and Gynecology

## 2023-01-14 ENCOUNTER — Ambulatory Visit (INDEPENDENT_AMBULATORY_CARE_PROVIDER_SITE_OTHER): Payer: Medicaid Other | Admitting: Clinical

## 2023-01-14 DIAGNOSIS — F332 Major depressive disorder, recurrent severe without psychotic features: Secondary | ICD-10-CM

## 2023-01-14 DIAGNOSIS — F411 Generalized anxiety disorder: Secondary | ICD-10-CM

## 2023-01-14 NOTE — Patient Instructions (Incomplete)
Center for [facility] at [facility] [place], [place] [address] [phone] (main office) [phone] [facility] office)  New Parent Support Groups www.postpartum.net www.conehealthybaby.com  Armed forces training and education officer  (Childcare options, Early childcare development, etc.) PopTick.no  [place]  [email or web address]  Taxes***

## 2023-01-19 ENCOUNTER — Encounter: Payer: Self-pay | Admitting: Obstetrics & Gynecology

## 2023-01-19 NOTE — BH Specialist Note (Deleted)
Integrated Behavioral Health via Telemedicine Visit  01/19/2023 SAFIATOU ISLAM [id number]  Number of [place] Clinician visits: 4- Fourth Visit  Session Start time: 4825   Session End time: 0037  Total time in minutes: 39   Referring Provider: *** Patient/Family location: [facility] Provider location: *** All persons participating in visit: *** Types of Service: {CHL AMB TYPE OF SERVICE:[phone]}  I connected with Carleene Overlie and/or Filiberto Pinks Kraai's {family members:20773} via  Telephone or Video Enabled Telemedicine Application  (Video is Caregility application) and verified that I am speaking with the correct person using two identifiers. Discussed confidentiality: {YES/NO:21197}  I discussed the limitations of telemedicine and the availability of in person appointments.  Discussed there is a possibility of technology failure and discussed alternative modes of communication if that failure occurs.  I discussed that engaging in this telemedicine visit, they consent to the provision of behavioral healthcare and the services will be billed under their insurance.  Patient and/or legal guardian expressed understanding and consented to Telemedicine visit: {YES/NO:21197}  Presenting Concerns: Patient and/or family reports the following symptoms/concerns: *** Duration of problem: ***; Severity of problem: {Mild/Moderate/Severe:20260}  Patient and/or Family's Strengths/Protective Factors: {CHL AMB BH PROTECTIVE FACTORS:[phone]}  Goals Addressed: Patient will:  Reduce symptoms of: {IBH Symptoms:21014056}   Increase knowledge and/or ability of: {IBH Patient Tools:21014057}   Demonstrate ability to: {IBH Goals:21014053}  Progress towards Goals: {CHL AMB BH PROGRESS TOWARDS GOALS:[id number]}  Interventions: Interventions utilized:  {IBH Interventions:21014054} Standardized Assessments completed: {IBH Screening Tools:21014051}  Patient and/or Family  Response: ***  Assessment: Patient currently experiencing ***.   Patient may benefit from ***.  Plan: Follow up with behavioral health clinician on : *** Behavioral recommendations: *** Referral(s): {IBH Referrals:21014055}  I discussed the assessment and treatment plan with the patient and/or parent/guardian. They were provided an opportunity to ask questions and all were answered. They agreed with the plan and demonstrated an understanding of the instructions.   They were advised to call back or seek an in-person evaluation if the symptoms worsen or if the condition fails to improve as anticipated.  Caroleen Hamman Harrison Zetina, LCSW

## 2023-01-20 ENCOUNTER — Ambulatory Visit: Payer: Self-pay

## 2023-01-20 NOTE — Telephone Encounter (Signed)
   Chief Complaint: Had intercourse 5 weeks post partum and then took Plan B. "Ob Won't see me, told me to call PCP and I don't have one."Given Community care practice numbers per PEC agent. Symptoms: "Seems like period bleeding." Frequency:  Pertinent Negatives: Patient denies any other symptoms. Disposition: [] ED /[] Urgent Care (no appt availability in office) / [] Appointment(In office/virtual)/ []  [place] Virtual Care/ [] Home Care/ [] Refused Recommended Disposition /[]  Mobile Bus/ [x]  Follow-up with PCP Additional Notes:   Reason for Disposition  [1] Periods with > 6 soaked pads or tampons per day AND [2] last > 7 days  Answer Assessment - Initial Assessment Questions 1. AMOUNT: "Describe the bleeding that you are having."    - SPOTTING: spotting, or pinkish / brownish mucous discharge; does not fill panty liner or pad    - MILD:  less than 1 pad / hour; less than patient's usual menstrual bleeding   - MODERATE: 1-2 pads / hour; 1 menstrual cup every 6 hours; small-medium blood clots (e.g., pea, grape, small coin)   - SEVERE: soaking 2 or more pads/hour for 2 or more hours; 1 menstrual cup every 2 hours; bleeding not contained by pads or continuous red blood from vagina; large blood clots (e.g., golf ball, large coin)      Moderate 2. ONSET: "When did the bleeding begin?" "Is it continuing now?"     Saturday 3. MENSTRUAL PERIOD: "When was the last normal menstrual period?" "How is this different than your period?"     Several months 4. REGULARITY: "How regular are your periods?"     irregular 5. ABDOMEN PAIN: "Do you have any pain?" "How bad is the pain?"  (e.g., Scale 1-10; mild, moderate, or severe)   - MILD (1-3): doesn't interfere with normal activities, abdomen soft and not tender to touch    - MODERATE (4-7): interferes with normal activities or awakens from sleep, abdomen tender to touch    - SEVERE (8-10): excruciating pain, doubled over, unable to do any normal  activities      Mild 6. PREGNANCY: "Is there any chance you are pregnant?" "When was your last menstrual period?"     Usure 7. BREASTFEEDING: "Are you breastfeeding?"     Yes 8. HORMONE MEDICINES: "Are you taking any hormone medicines, prescription or over-the-counter?" (e.g., birth control pills, estrogen)     Took Plan B 9. BLOOD THINNER MEDICINES: "Do you take any blood thinners?" (e.g., Coumadin / warfarin, Pradaxa / dabigatran, aspirin)     No 10. CAUSE: "What do you think is causing the bleeding?" (e.g., recent gyn surgery, recent gyn procedure; known bleeding disorder, cervical cancer, polycystic ovarian disease, fibroids)         Plan B 11. HEMODYNAMIC STATUS: "Are you weak or feeling lightheaded?" If Yes, ask: "Can you stand and walk normally?"        nO 12. OTHER SYMPTOMS: "What other symptoms are you having with the bleeding?" (e.g., passed tissue, vaginal discharge, fever, menstrual-type cramps)       nO  Protocols used: Vaginal Bleeding - Abnormal-A-AH

## 2023-01-22 ENCOUNTER — Other Ambulatory Visit: Payer: Self-pay | Admitting: *Deleted

## 2023-01-22 DIAGNOSIS — O24429 Gestational diabetes mellitus in childbirth, unspecified control: Secondary | ICD-10-CM

## 2023-01-26 NOTE — BH Specialist Note (Signed)
Pt did not arrive to video visit and did not answer the phone; Left HIPPA-compliant message to call back Justen Fonda from [facility] at  [phone] (Hazle Ogburn's office).  ?; left MyChart message for patient.  ? ?

## 2023-01-28 ENCOUNTER — Encounter: Payer: Self-pay | Admitting: Obstetrics & Gynecology

## 2023-01-28 ENCOUNTER — Other Ambulatory Visit: Payer: Medicaid Other

## 2023-01-28 ENCOUNTER — Ambulatory Visit: Payer: Medicaid Other | Admitting: Obstetrics & Gynecology

## 2023-01-28 DIAGNOSIS — Z3040 Encounter for surveillance of contraceptives, unspecified: Secondary | ICD-10-CM

## 2023-01-28 DIAGNOSIS — Z30017 Encounter for initial prescription of implantable subdermal contraceptive: Secondary | ICD-10-CM | POA: Diagnosis not present

## 2023-01-28 DIAGNOSIS — O24429 Gestational diabetes mellitus in childbirth, unspecified control: Secondary | ICD-10-CM

## 2023-01-28 DIAGNOSIS — Z7251 High risk heterosexual behavior: Secondary | ICD-10-CM

## 2023-01-28 DIAGNOSIS — F332 Major depressive disorder, recurrent severe without psychotic features: Secondary | ICD-10-CM

## 2023-01-28 LAB — POCT PREGNANCY, URINE: Preg Test, Ur: NEGATIVE

## 2023-01-28 MED ORDER — ETONOGESTREL 68 MG ~~LOC~~ IMPL
68.0000 mg | DRUG_IMPLANT | Freq: Once | SUBCUTANEOUS | Status: AC
  Administered 2023-01-28: 68 mg via SUBCUTANEOUS

## 2023-01-28 NOTE — Progress Notes (Signed)
Pt scored 14 on [place], currently has appt with Roselyn Reef on 02/06/23 & has own Therapist.

## 2023-01-28 NOTE — Progress Notes (Signed)
    [place] Partum Visit Note  Courtney Holloway is a 22 y.o. G53P1001 female who presents for a postpartum visit. She is 5 weeks postpartum following a primary cesarean section.  I have fully reviewed the prenatal and intrapartum course. The delivery was at 37/2 gestational weeks.  Anesthesia: epidural. Postpartum course has been good. Baby is doing well. Baby is feeding by both breast and bottle - [place] Good Start. Bleeding no bleeding. Bowel function is normal. Bladder function is normal. Patient is sexually active. Contraception method is none. Postpartum depression screening: positive.   The pregnancy intention screening data noted above was reviewed. Potential methods of contraception were discussed. The patient elected to proceed with No data recorded.    Health Maintenance Due  Topic Date Due   FOOT EXAM  Never done   OPHTHALMOLOGY EXAM  Never done   Diabetic kidney evaluation - Urine ACR  Never done   COVID-19 Vaccine (3 - 2023-24 season) 08/15/2022    The following portions of the patient's history were reviewed and updated as appropriate: allergies, current medications, past family history, past medical history, past social history, past surgical history, and problem list.  Review of Systems Pertinent items are noted in HPI.  Objective:  LMP 04/01/2022 (Exact Date)    General:  alert, cooperative, and no distress   Breasts:  not indicated  Lungs:   Heart:  regular rate and rhythm  Abdomen: soft, non-tender; bowel sounds normal; no masses,  no organomegaly   Wound well approximated incision  GU exam:  not indicated       Assessment:    There are no diagnoses linked to this encounter.  normal postpartum exam.   Plan:   Essential components of care per ACOG recommendations:  1.  Mood and well being: Patient with negative depression screening today. Reviewed local resources for support.  - Patient tobacco use? No.   - hx of drug use? No.    2. Infant care and  feeding:  -Patient currently breastmilk feeding? Yes. Discussed returning to work and pumping.  -Social determinants of health ([place]) reviewed in [place]. No concerns  3. Sexuality, contraception and birth spacing - Patient does not want a pregnancy in the next year.  Desired family size is 2 children.  - Reviewed reproductive life planning. Reviewed contraceptive methods based on pt preferences and effectiveness.  Patient desired Hormonal Implant today.   - Discussed birth spacing of 18 months  4. Sleep and fatigue -Encouraged family/partner/community support of 4 hrs of uninterrupted sleep to help with mood and fatigue  5. Physical Recovery  - Discussed patients delivery and complications. She describes her labor as mixed. - Patient had a C-section, no problems at delivery. - Patient has urinary incontinence? No. - Patient is safe to resume physical and sexual activity  6.  Health Maintenance - HM due items addressed Yes - Last pap smear No results found for: "DIAGPAP" Pap smear not done at today's visit.  -Breast Cancer screening indicated? No.   7. Chronic Disease/Pregnancy Condition follow up: None  - PCP follow up  Woodroe Mode, MD  Center for [place], [place]

## 2023-01-28 NOTE — Progress Notes (Signed)
Patient ID: OREAN DARIS, female   DOB: 2001/01/30, 22 y.o.   MRN: [id number] GYNECOLOGY OFFICE PROCEDURE NOTE  CHAZ LESTER is a 22 y.o. G1P1001 here for Nexplanon insertion.    No other gynecologic concerns. She used plan B after intercourse one week ago Nexplanon Insertion Procedure Patient identified, informed consent performed, consent signed.   Patient does understand that irregular bleeding is a very common side effect of this medication. She was advised to have backup contraception for one week after placement. Pregnancy test in clinic today was negative.  Appropriate time out taken.  Patient's left arm was prepped and draped in the usual sterile fashion. The ruler used to measure and mark insertion area.  Patient was prepped with alcohol swab and then injected with 3 ml of 1% lidocaine.  She was prepped with betadine, Nexplanon removed from packaging,  Device confirmed in needle, then inserted full length of needle and withdrawn per handbook instructions. Nexplanon was able to palpated in the patient's arm; patient palpated the insert herself. There was minimal blood loss.  Patient insertion site covered with gauze and a pressure bandage to reduce any bruising.  The patient tolerated the procedure well and was given post procedure instructions.   Woodroe Mode, MD Attending Obstetrician & Gynecologist, [place] [facility] [place]  01/28/2023

## 2023-01-29 LAB — GLUCOSE TOLERANCE, 2 HOURS
Glucose, 2 hour: 105 mg/dL (ref 70–139)
Glucose, GTT - Fasting: 88 mg/dL (ref 70–99)

## 2023-01-29 MED ORDER — SERTRALINE HCL 50 MG PO TABS: 50.0000 mg | ORAL_TABLET | Freq: Every day | ORAL | 2 refills | Status: DC

## 2023-01-29 MED ORDER — ULIPRISTAL ACETATE 30 MG PO TABS: 1.0000 | ORAL_TABLET | Freq: Once | ORAL | 0 refills | Status: AC

## 2023-01-30 ENCOUNTER — Ambulatory Visit: Payer: Medicaid Other | Admitting: Family

## 2023-01-30 ENCOUNTER — Ambulatory Visit: Payer: Self-pay

## 2023-01-30 ENCOUNTER — Telehealth: Payer: Medicaid Other | Admitting: Family Medicine

## 2023-01-30 NOTE — Progress Notes (Signed)
Pt was advised to go to ED. I did try to connect at apptmt time with no response. DWB

## 2023-01-30 NOTE — Telephone Encounter (Signed)
Chief Complaint: dizziness Symptoms: headache, head spins, nausea, moderate dizziness (able to walk, but feels like will fall) Frequency: Onset last night Pertinent Negatives: Patient denies other symptoms Disposition: []$ ED /[]$ Urgent Care (no appt availability in office) / []$ Appointment(In office/virtual)/ [x]$  [place] Virtual Care/ []$ Home Care/ []$ Refused Recommended Disposition /[]$ [place] Mobile Bus/ []$  Follow-up with PCP Additional Notes: No availability in the office with any provider, advised virtual UC, patient scheduled through MyChart for today at 1400. Advised ED if symptoms continue and not get better. Patient is 1 month post partum and mentioned nausea may be due to her taking the plan B.     Reason for Disposition  [1] MODERATE dizziness (e.g., vertigo; feels very unsteady, interferes with normal activities) AND [2] has NOT been evaluated by doctor (or NP/PA) for this  Answer Assessment - Initial Assessment Questions 1. DESCRIPTION: "Describe your dizziness."     Feel off, lightheaded, weird 2. VERTIGO: "Do you feel like either you or the room is spinning or tilting?"      Head spins 3. LIGHTHEADED: "Do you feel lightheaded?" (e.g., somewhat faint, woozy, weak upon standing)     Yes 4. SEVERITY: "How bad is it?"  "Can you walk?"   - MILD: Feels slightly dizzy and unsteady, but is walking normally.   - MODERATE: Feels unsteady when walking, but not falling; interferes with normal activities (e.g., school, work).   - SEVERE: Unable to walk without falling, or requires assistance to walk without falling.     Moderate 5. ONSET:  "When did the dizziness begin?"     Last night 6. AGGRAVATING FACTORS: "Does anything make it worse?" (e.g., standing, change in head position)     Standing 7. CAUSE: "What do you think is causing the dizziness?"     I don't know 8. RECURRENT SYMPTOM: "Have you had dizziness before?" If Yes, ask: "When was the last time?" "What happened that  time?"     No 9. OTHER SYMPTOMS: "Do you have any other symptoms?" (e.g., headache, weakness, numbness, vomiting, earache)     Headache, nausea 10. PREGNANCY: "Is there any chance you are pregnant?" "When was your last menstrual period?"       No  Protocols used: Dizziness - Vertigo-A-AH

## 2023-02-01 IMAGING — CR DG CHEST 2V
2 series · 2 of 2 positions shown · non-contrast
Comparison: 05/30/2018

CLINICAL DATA: Shortness of breath with body aches and sore throat,
nausea, diarrhea and cough 3 days. Chest pain.

EXAM:
CHEST - 2 VIEW

[chest lat]
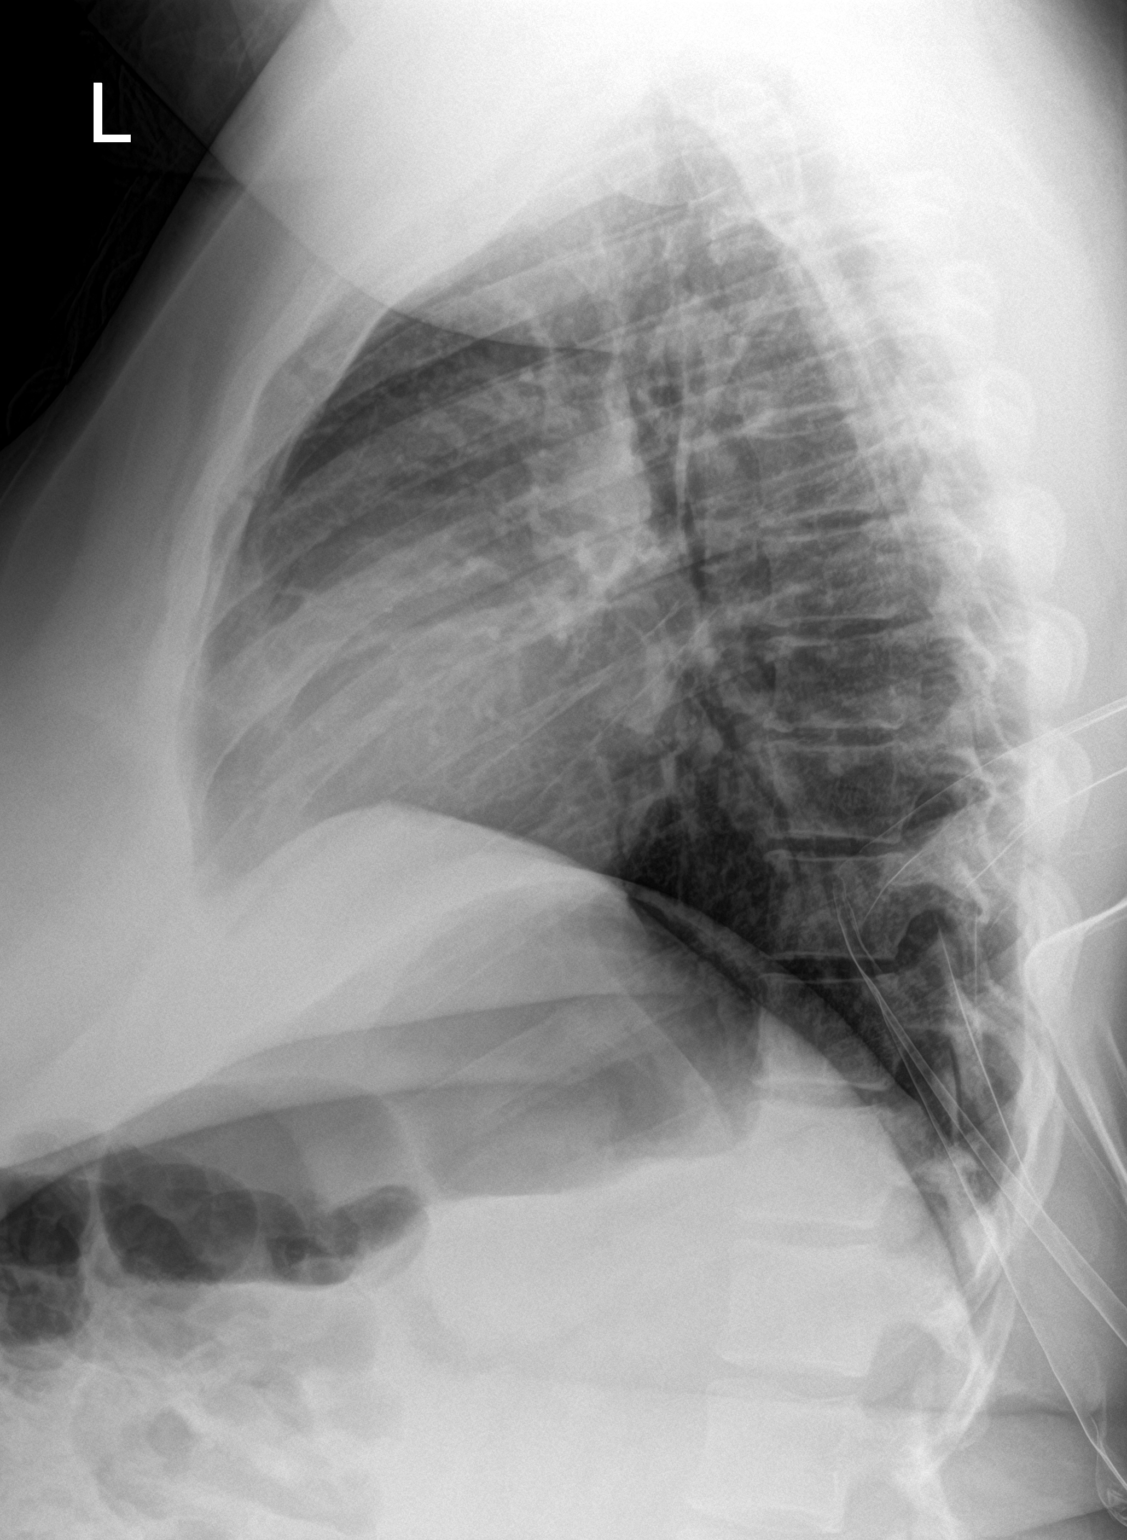

[chest ap]
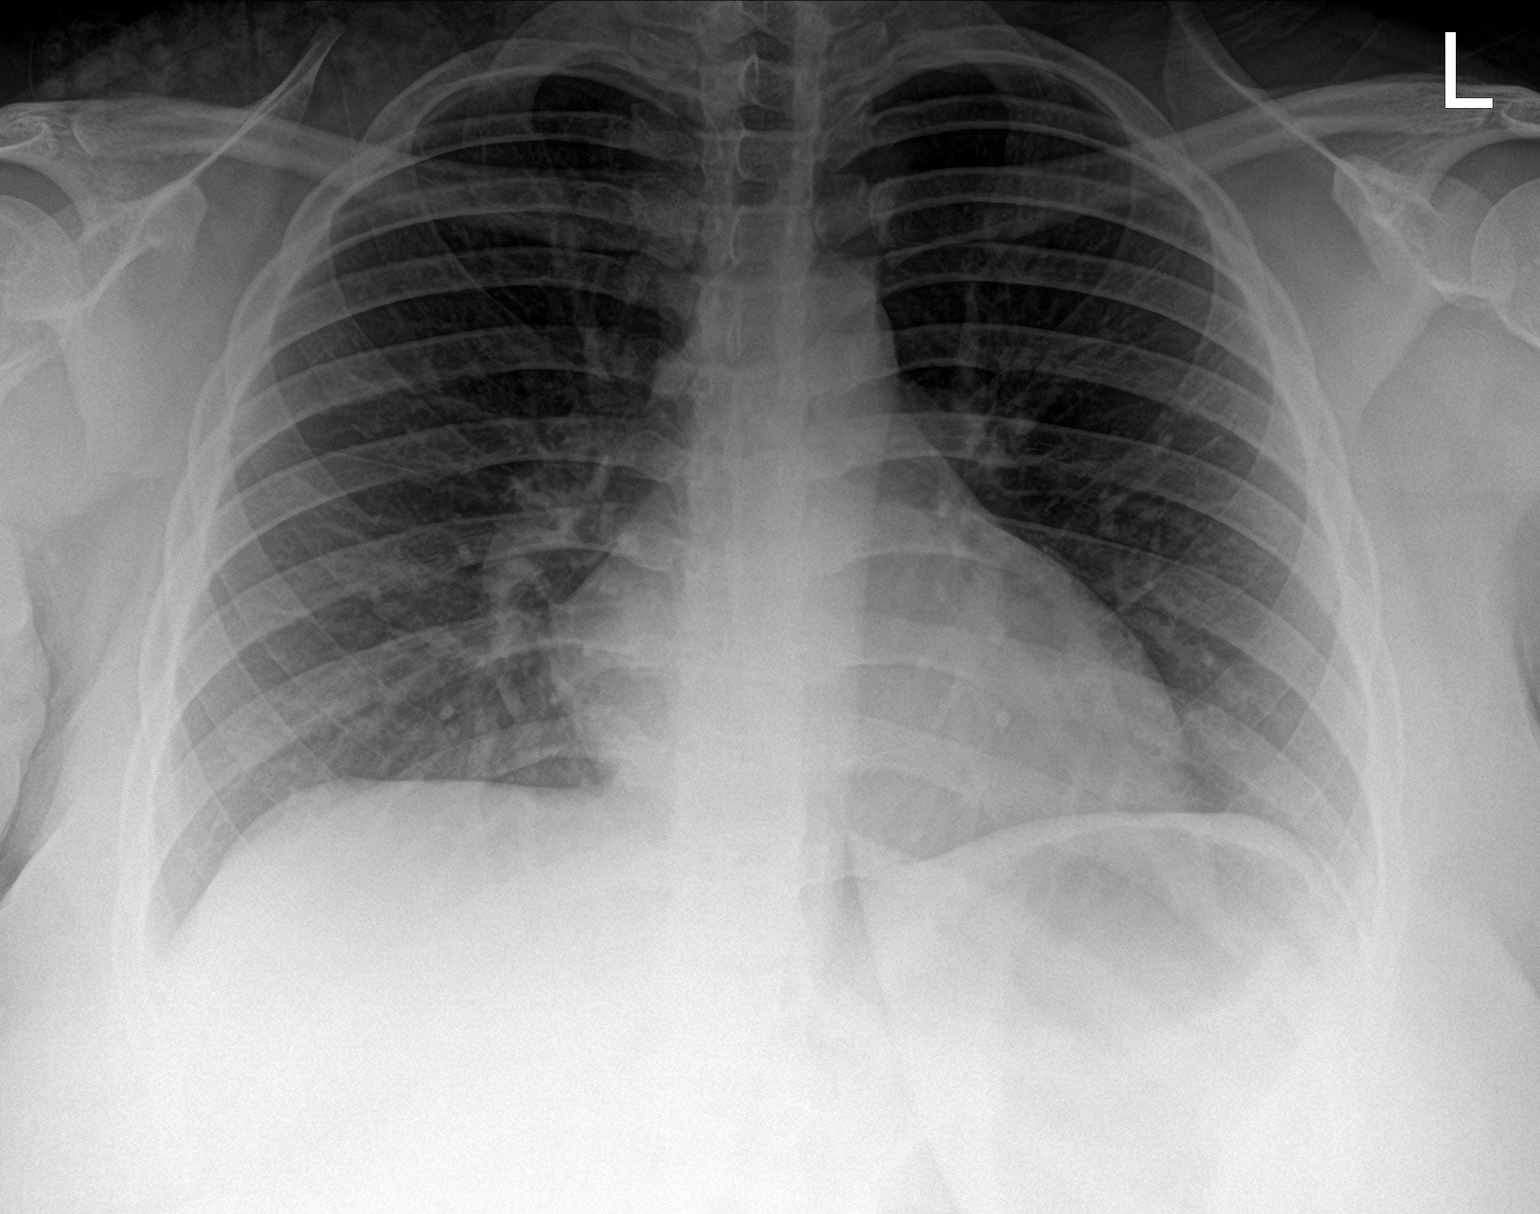

[2 of 2 positions shown; findings below may reference images not displayed]

FINDINGS: Lungs are adequately inflated without focal airspace consolidation
or effusion. Cardiomediastinal silhouette and remainder of the exam
is unchanged.
IMPRESSION: No active cardiopulmonary disease.

## 2023-02-06 ENCOUNTER — Ambulatory Visit: Payer: Medicaid Other | Admitting: Clinical

## 2023-02-06 DIAGNOSIS — Z91199 Patient's noncompliance with other medical treatment and regimen due to unspecified reason: Secondary | ICD-10-CM

## 2023-02-12 ENCOUNTER — Telehealth: Payer: Self-pay | Admitting: General Practice

## 2023-02-12 NOTE — Telephone Encounter (Signed)
Called patient regarding refill request from pharmacy for [place] prescription. Patient states she thinks that is old and no longer needs it. Patient had no questions.

## 2023-02-12 NOTE — BH Specialist Note (Unsigned)
Integrated Behavioral Health via Telemedicine Visit  02/26/2023 [name] [id number]  Number of [place] Clinician visits: 5-Fifth Visit  Session Start time: 2423   Session End time: 5361  Total time in minutes: 17   Referring Provider: Johnston Ebbs, NP Patient/Family location: Home [facility] Provider location: Center for [place] at [facility]  All persons participating in visit: Patient Courtney Holloway and [place]   Types of Service: Individual psychotherapy and Telephone visit  I connected with Carleene Overlie and/or Filiberto Pinks Teed's  n/a  via  Telephone or Video Enabled Telemedicine Application  (Video is Caregility application) and verified that I am speaking with the correct person using two identifiers. Discussed confidentiality: Yes   I discussed the limitations of telemedicine and the availability of in person appointments.  Discussed there is a possibility of technology failure and discussed alternative modes of communication if that failure occurs.  I discussed that engaging in this telemedicine visit, they consent to the provision of behavioral healthcare and the services will be billed under their insurance.  Patient and/or legal guardian expressed understanding and consented to Telemedicine visit: Yes   Presenting Concerns: Patient and/or family reports the following symptoms/concerns: Feeling more depressed and anxious postpartum,; attributes to poor sleep quality and life stress. Pt has not picked up increased dosage of Zoloft yet; stress over managing school assignments, starting back to work soon, and fussy baby. Duration of problem: Increase postpartum; Severity of problem: severe  Patient and/or Family's Strengths/Protective Factors: Sense of purpose  Goals Addressed: Patient will:  Reduce symptoms of: anxiety, depression, insomnia, mood instability, and stress   Demonstrate ability to:  Increase healthy adjustment to current life circumstances, Increase motivation to adhere to plan of care, and Improve medication compliance  Progress towards Goals: Ongoing  Interventions: Interventions utilized:  Motivational Interviewing, Medication Monitoring, and Link to [employer] Assessments completed: Not Needed  Patient and/or Family Response: Patient agrees with treatment plan.   Assessment: Patient currently experiencing Major depressive disorder, recurrent, severe without psychotic features; Generalized anxiety disorder.   Patient may benefit from continued therapeutic interventions and psychiatry for [facility] medication management.  Plan: Follow up with behavioral health clinician on : Two weeks Behavioral recommendations:  -Continue plan to pick up Zoloft and begin taking as prescribed at increased dosage -Continue to accept referral to psychiatry (make sure voicemail is set up and not full to receive call to schedule) -Continue plan to work with school to switch programs to online public health -Consider using [facility] Urgent Care and/or walk-in outpatient hours to establish care  -Read through After Visit Summary; use information as needed Referral(s): Integrated Orthoptist (In Clinic) and [place] (LME/Outside Clinic)  I discussed the assessment and treatment plan with the patient and/or parent/guardian. They were provided an opportunity to ask questions and all were answered. They agreed with the plan and demonstrated an understanding of the instructions.   They were advised to call back or seek an in-person evaluation if the symptoms worsen or if the condition fails to improve as anticipated.  Courtney Fair, LCSW     01/14/2023    2:37 PM 10/06/2022   11:43 AM 08/04/2022    2:16 PM 07/02/2022    9:06 AM 01/13/2022   11:38 AM  Depression screen PHQ 2/9  Decreased Interest 3 1 1 3 2   Down, Depressed,  Hopeless 3 1 1 3 3   PHQ - 2 Score  6 2 2 6 5   Altered sleeping 3 1 2 3 3   Tired, decreased energy 3 1 2 3 3   Change in appetite 3 1 2 3 3   Feeling bad or failure about yourself  1 1 2 3 3   Trouble concentrating 3 0 0 0 0  Moving slowly or fidgety/restless 0 0 0 0 0  Suicidal thoughts 0 0 0 0   PHQ-9 Score 19 6 10 18 17       01/14/2023    2:43 PM 10/06/2022   11:43 AM 08/04/2022    2:16 PM 07/02/2022    9:06 AM  GAD 7 : Generalized Anxiety Score  Nervous, Anxious, on Edge 0 1 2 2   Control/stop worrying 3 1 2 2   Worry too much - different things 3 1 2 3   Trouble relaxing 3 1 2 2   Restless 0 0 0 0  Easily annoyed or irritable 1 1 2 3   Afraid - awful might happen 1 1 2 2   Total GAD 7 Score 11 6 12  14

## 2023-02-16 ENCOUNTER — Inpatient Hospital Stay (HOSPITAL_COMMUNITY)
Admission: AD | Admit: 2023-02-16 | Discharge: 2023-02-16 | Disposition: A | Discharge status: Home / Self Care | Payer: Medicaid Other | Attending: Family Medicine | Admitting: Family Medicine

## 2023-02-16 ENCOUNTER — Inpatient Hospital Stay (HOSPITAL_COMMUNITY): Payer: Medicaid Other

## 2023-02-16 ENCOUNTER — Encounter (HOSPITAL_COMMUNITY): Payer: Self-pay | Admitting: Family Medicine

## 2023-02-16 DIAGNOSIS — F332 Major depressive disorder, recurrent severe without psychotic features: Secondary | ICD-10-CM

## 2023-02-16 DIAGNOSIS — F53 Postpartum depression: Secondary | ICD-10-CM

## 2023-02-16 DIAGNOSIS — S63501A Unspecified sprain of right wrist, initial encounter: Secondary | ICD-10-CM

## 2023-02-16 MED ORDER — SERTRALINE HCL 100 MG PO TABS: 100.0000 mg | ORAL_TABLET | Freq: Every day | ORAL | 2 refills | Status: DC
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
## 2023-02-26 ENCOUNTER — Ambulatory Visit (INDEPENDENT_AMBULATORY_CARE_PROVIDER_SITE_OTHER): Payer: Medicaid Other | Admitting: Clinical

## 2023-02-26 DIAGNOSIS — F411 Generalized anxiety disorder: Secondary | ICD-10-CM

## 2023-02-26 DIAGNOSIS — F332 Major depressive disorder, recurrent severe without psychotic features: Secondary | ICD-10-CM

## 2023-02-26 NOTE — Patient Instructions (Signed)
[facility] [address] [place], [place] [address] [phone] (main office) [phone] (Azaya Goedde's office)  [facility]  [address], [place], [place] [address] [phone] or [phone] WALK-IN URGENT CARE 24/7 FOR ANYONE [address], , [place]  [phone] Fax: [phone] [email or web address] *Interpreters available *Accepts all insurance and uninsured for Urgent Care needs *Accepts Medicaid and uninsured for outpatient treatment (below)    ONLY FOR [place] Residents  Below:   Outpatient New Patient Assessment/Therapy Walk-ins:        Monday -Thursday 8am until slots are full.        Every Friday 1pm-4pm  (first come, first served)                   New Patient Psychiatry/Medication Management        Monday-Friday 8am-11am (first come, first served)              For all walk-ins we ask that you arrive by 7:15am, because patients will be seen in the order of arrival.     

## 2023-03-05 ENCOUNTER — Encounter: Payer: Self-pay | Admitting: Family Medicine

## 2023-03-05 NOTE — BH Specialist Note (Unsigned)
Pt did not arrive to video visit and did not answer the phone; Left HIPPA-compliant message to call back Jusiah Aguayo from [facility] at  [phone] (Cezar Misiaszek's office).  ?; left MyChart message for patient.  ? ?

## 2023-03-10 ENCOUNTER — Telehealth: Payer: Self-pay | Admitting: Oncology

## 2023-03-10 NOTE — Telephone Encounter (Signed)
scheduled per 3/22 referral, pt has been called and confirmed date and time. Pt is aware of location and to arrive early for check in

## 2023-03-16 ENCOUNTER — Encounter: Payer: Medicaid Other | Admitting: Family

## 2023-03-19 ENCOUNTER — Telehealth: Payer: Medicaid Other | Admitting: Family

## 2023-03-19 ENCOUNTER — Ambulatory Visit: Payer: Medicaid Other | Admitting: Clinical

## 2023-03-19 ENCOUNTER — Other Ambulatory Visit: Payer: Self-pay | Admitting: Family

## 2023-03-19 ENCOUNTER — Encounter: Payer: Self-pay | Admitting: Family

## 2023-03-19 DIAGNOSIS — Z7187 Encounter for pediatric-to-adult transition counseling: Secondary | ICD-10-CM

## 2023-03-19 DIAGNOSIS — Z91199 Patient's noncompliance with other medical treatment and regimen due to unspecified reason: Secondary | ICD-10-CM

## 2023-03-19 MED ORDER — LORATADINE 10 MG PO TABS: 10.0000 mg | ORAL_TABLET | Freq: Every day | ORAL | 0 refills | Status: DC

## 2023-04-02 ENCOUNTER — Ambulatory Visit: Payer: Medicaid Other | Admitting: Family Medicine

## 2023-04-03 ENCOUNTER — Other Ambulatory Visit: Payer: Self-pay

## 2023-04-03 ENCOUNTER — Inpatient Hospital Stay: Payer: Medicaid Other

## 2023-04-03 ENCOUNTER — Inpatient Hospital Stay: Payer: Medicaid Other | Attending: Oncology | Admitting: Oncology

## 2023-04-03 ENCOUNTER — Encounter: Payer: Self-pay | Admitting: Oncology

## 2023-04-03 ENCOUNTER — Telehealth: Payer: Self-pay | Admitting: Oncology

## 2023-04-03 VITALS — BP 109/72 | HR 85 | Temp 97.9°F | Resp 16 | Wt 235.4 lb

## 2023-04-03 DIAGNOSIS — D539 Nutritional anemia, unspecified: Secondary | ICD-10-CM

## 2023-04-03 DIAGNOSIS — D649 Anemia, unspecified: Secondary | ICD-10-CM | POA: Diagnosis present

## 2023-04-03 DIAGNOSIS — Z87891 Personal history of nicotine dependence: Secondary | ICD-10-CM | POA: Diagnosis not present

## 2023-04-03 DIAGNOSIS — O9081 Anemia of the puerperium: Secondary | ICD-10-CM

## 2023-04-03 DIAGNOSIS — N938 Other specified abnormal uterine and vaginal bleeding: Secondary | ICD-10-CM | POA: Insufficient documentation

## 2023-04-03 LAB — CMP (CANCER CENTER ONLY)
ALT: 18 U/L (ref 0–44)
AST: 12 U/L — ABNORMAL LOW (ref 15–41)
Albumin: 4.4 g/dL (ref 3.5–5.0)
Alkaline Phosphatase: 48 U/L (ref 38–126)
Anion gap: 6 (ref 5–15)
BUN: 11 mg/dL (ref 6–20)
CO2: 26 mmol/L (ref 22–32)
Calcium: 9.4 mg/dL (ref 8.9–10.3)
Chloride: 105 mmol/L (ref 98–111)
Creatinine: 0.61 mg/dL (ref 0.44–1.00)
GFR, Estimated: >60 mL/min (ref >60)
Glucose, Bld: 107 mg/dL — ABNORMAL HIGH (ref 70–99)
Potassium: 3.9 mmol/L (ref 3.5–5.1)
Sodium: 137 mmol/L (ref 135–145)
Total Bilirubin: 0.5 mg/dL (ref 0.3–1.2)
Total Protein: 7.6 g/dL (ref 6.5–8.1)

## 2023-04-03 LAB — CBC WITH DIFFERENTIAL (CANCER CENTER ONLY)
Abs Immature Granulocytes: 0.08 10*3/uL — ABNORMAL HIGH (ref 0.00–0.07)
Basophils Absolute: 0 10*3/uL (ref 0.0–0.1)
Basophils Relative: 0 %
Eosinophils Absolute: 0.1 10*3/uL (ref 0.0–0.5)
Eosinophils Relative: 1 %
HCT: 38.5 % (ref 36.0–46.0)
Hemoglobin: 12.5 g/dL (ref 12.0–15.0)
Immature Granulocytes: 1 %
Lymphocytes Relative: 22 %
Lymphs Abs: 1.8 10*3/uL (ref 0.7–4.0)
MCH: 25.6 pg — ABNORMAL LOW (ref 26.0–34.0)
MCHC: 32.5 g/dL (ref 30.0–36.0)
MCV: 78.9 fL — ABNORMAL LOW (ref 80.0–100.0)
Monocytes Absolute: 0.6 10*3/uL (ref 0.1–1.0)
Monocytes Relative: 7 %
Neutro Abs: 5.5 10*3/uL (ref 1.7–7.7)
Neutrophils Relative %: 69 %
Platelet Count: 403 10*3/uL — ABNORMAL HIGH (ref 150–400)
RBC: 4.88 MIL/uL (ref 3.87–5.11)
RDW: 16.7 % — ABNORMAL HIGH (ref 11.5–15.5)
WBC Count: 7.9 10*3/uL (ref 4.0–10.5)
nRBC: 0 % (ref 0.0–0.2)

## 2023-04-03 LAB — FIBRINOGEN: Fibrinogen: 477 mg/dL — ABNORMAL HIGH (ref 210–475)

## 2023-04-03 LAB — FOLATE: Folate: 14.6 ng/mL (ref >5.9)

## 2023-04-03 LAB — DIRECT ANTIGLOBULIN TEST (NOT AT ARMC)
DAT, IgG: NEGATIVE
DAT, complement: NEGATIVE

## 2023-04-03 LAB — RETICULOCYTES
Immature Retic Fract: 10 % (ref 2.3–15.9)
RBC.: 4.89 MIL/uL (ref 3.87–5.11)
Retic Count, Absolute: 44.5 10*3/uL (ref 19.0–186.0)
Retic Ct Pct: 0.9 % (ref 0.4–3.1)

## 2023-04-03 LAB — FERRITIN: Ferritin: 29 ng/mL (ref 11–307)

## 2023-04-03 LAB — APTT: aPTT: 27 seconds (ref 24–36)

## 2023-04-03 LAB — VITAMIN B12: Vitamin B-12: 220 pg/mL (ref 180–914)

## 2023-04-03 LAB — PROTIME-INR
INR: 1 (ref 0.8–1.2)
Prothrombin Time: 13.4 seconds (ref 11.4–15.2)
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
## 2023-04-04 LAB — COAG STUDIES INTERP REPORT

## 2023-04-04 LAB — VON WILLEBRAND PANEL
Coagulation Factor VIII: 190 % — ABNORMAL HIGH (ref 56–140)
Ristocetin Co-factor, Plasma: 93 % (ref 50–200)
Von Willebrand Antigen, Plasma: 171 % (ref 50–200)

## 2023-04-05 LAB — HAPTOGLOBIN: Haptoglobin: 230 mg/dL (ref 33–278)

## 2023-04-07 ENCOUNTER — Telehealth: Payer: Self-pay | Admitting: Oncology

## 2023-04-07 ENCOUNTER — Inpatient Hospital Stay: Payer: Medicaid Other

## 2023-04-09 ENCOUNTER — Telehealth: Payer: Self-pay | Admitting: Oncology

## 2023-04-09 ENCOUNTER — Inpatient Hospital Stay: Payer: Medicaid Other

## 2023-04-09 LAB — HGB FRACTIONATION CASCADE
Hgb A2: 2.4 % (ref 1.8–3.2)
Hgb A: 97.6 % (ref 96.4–98.8)
Hgb F: 0 % (ref 0.0–2.0)
Hgb S: 0 %

## 2023-04-09 NOTE — Telephone Encounter (Signed)
Patient called to cancel her upcoming infusions, she will call back when she has a Comptroller.

## 2023-04-13 ENCOUNTER — Encounter: Payer: Self-pay | Admitting: Oncology

## 2023-04-13 DIAGNOSIS — N938 Other specified abnormal uterine and vaginal bleeding: Secondary | ICD-10-CM | POA: Insufficient documentation

## 2023-04-13 DIAGNOSIS — O9081 Anemia of the puerperium: Secondary | ICD-10-CM | POA: Insufficient documentation

## 2023-04-14 ENCOUNTER — Inpatient Hospital Stay: Payer: Medicaid Other

## 2023-04-15 ENCOUNTER — Telehealth: Payer: Self-pay

## 2023-04-15 ENCOUNTER — Ambulatory Visit: Payer: Medicaid Other | Admitting: Nurse Practitioner

## 2023-04-15 NOTE — Telephone Encounter (Signed)
We actually do not take [place]. There will not be a fee associated with missed visit, but pt will need to look for a provider accepting [place] [facility].

## 2023-04-15 NOTE — Telephone Encounter (Signed)
New pt appoint called today @ 9:05 to cancel her appointment because she has to take her kid to take her kid to the emergency room . Pt said she tried to call yesterday to cancel her appointment . I let the pt know that she might not be able to reschedule here because of the 24 hr window call.

## 2023-04-21 ENCOUNTER — Telehealth: Payer: Self-pay | Admitting: Oncology

## 2023-04-23 ENCOUNTER — Encounter: Payer: Self-pay | Admitting: Oncology

## 2023-04-27 ENCOUNTER — Encounter: Payer: Self-pay | Admitting: Oncology

## 2023-04-27 ENCOUNTER — Ambulatory Visit (HOSPITAL_COMMUNITY): Payer: Medicaid Other | Admitting: Psychiatry

## 2023-04-27 ENCOUNTER — Encounter (HOSPITAL_COMMUNITY): Payer: Self-pay

## 2023-04-27 ENCOUNTER — Emergency Department (HOSPITAL_COMMUNITY)
Admission: EM | Admit: 2023-04-27 | Discharge: 2023-04-27 | Disposition: A | Discharge status: Home / Self Care | Payer: Medicaid Other | Attending: Emergency Medicine | Admitting: Emergency Medicine

## 2023-04-27 ENCOUNTER — Other Ambulatory Visit: Payer: Self-pay

## 2023-04-27 DIAGNOSIS — R519 Headache, unspecified: Secondary | ICD-10-CM

## 2023-04-27 DIAGNOSIS — J45909 Unspecified asthma, uncomplicated: Secondary | ICD-10-CM | POA: Insufficient documentation

## 2023-04-27 LAB — I-STAT BETA HCG BLOOD, ED (MC, WL, AP ONLY): I-stat hCG, quantitative: <5 m[IU]/mL (ref <5)

## 2023-04-27 MED ORDER — METOCLOPRAMIDE HCL 5 MG/ML IJ SOLN
10.0000 mg | Freq: Once | INTRAMUSCULAR | Status: AC
  Administered 2023-04-27: 10 mg via INTRAMUSCULAR
  Filled 2023-04-27: qty 2

## 2023-04-27 MED ORDER — KETOROLAC TROMETHAMINE 15 MG/ML IJ SOLN
15.0000 mg | Freq: Once | INTRAMUSCULAR | Status: AC
  Administered 2023-04-27: 15 mg via INTRAMUSCULAR
  Filled 2023-04-27: qty 1

## 2023-04-27 MED ORDER — METOCLOPRAMIDE HCL 10 MG PO TABS: 10.0000 mg | ORAL_TABLET | Freq: Four times a day (QID) | ORAL | 0 refills | Status: DC | PRN

## 2023-04-27 NOTE — ED Triage Notes (Signed)
Patient reports intermittent headache with mild nausea onset 2 days ago .

## 2023-04-27 NOTE — ED Provider Notes (Signed)
  Physical Exam  BP 120/85   Pulse 92   Temp 98.8 F (37.1 C)   Resp 18   Ht 5\' 1"  (1.549 m)   Wt 106 kg   SpO2 100%   BMI 44.15 kg/m   Physical Exam Vitals and nursing note reviewed.  Constitutional:      General: She is not in acute distress.    Appearance: She is well-developed.     Comments: Patient sleeping comfortably on my initial assessment  HENT:     Head: Normocephalic and atraumatic.  Eyes:     Conjunctiva/sclera: Conjunctivae normal.  Cardiovascular:     Rate and Rhythm: Normal rate and regular rhythm.     Heart sounds: No murmur heard. Pulmonary:     Effort: Pulmonary effort is normal. No respiratory distress.     Breath sounds: Normal breath sounds.  Abdominal:     Palpations: Abdomen is soft.     Tenderness: There is no abdominal tenderness.  Musculoskeletal:        General: No swelling.     Cervical back: Neck supple.  Skin:    General: Skin is warm and dry.     Capillary Refill: Capillary refill takes less than 2 seconds.  Neurological:     General: No focal deficit present.     Mental Status: She is alert and oriented to person, place, and time.     GCS: GCS eye subscore is 4. GCS verbal subscore is 5. GCS motor subscore is 6.     Cranial Nerves: No cranial nerve deficit.     Sensory: Sensation is intact. No sensory deficit.     Motor: Motor function is intact. No weakness.     Coordination: Coordination is intact. Heel to [facility] Test normal.     Comments: CN II through XII intact. Intact finger to nose, heel to shin. No pronator drift, no slurred speech. Moves all extremities in coordinated fashion. Speech clear.   Psychiatric:        Mood and Affect: Mood normal.     Procedures  Procedures  ED Course / MDM    Medical Decision Making Risk Prescription drug management.   21YOF presents to ED for evaluation of headache. See previous provider note for further details. Patient signed out to me pending medication administration and  reassessment.  On reassessment, patient found to be sleeping comfortably in bed. She wakes easily. She denies any headache at this time. She will be discharged and follow up with her PCP. She will come back with any new or worsening signs or symptoms.        Al Decant, PA-C 04/27/23 1610    Pricilla Loveless, MD 04/27/23 0800

## 2023-04-27 NOTE — Discharge Instructions (Signed)
We recommend ibuprofen or Excedrin Migraine for management of any persistent headaches.  Follow-up with your primary care doctor.  You may return for new or concerning symptoms.

## 2023-04-27 NOTE — ED Provider Notes (Signed)
[place] EMERGENCY DEPARTMENT AT [facility] Provider Note   CSN: [id number] Arrival date & time: 04/27/23  0409     History  Chief Complaint  Patient presents with   Headache    Courtney Holloway is a 22 y.o. female.  21 year old female presents to the emergency department for evaluation of a headache that began tonight while at work.  She states that headache has been intermittent over the past 2 days, but began to recur while on shift tonight.  Headache is global and associated with nausea.  She denies photophobia, phonophobia, congestion, vision changes or loss, vomiting, fevers.  Believes that her headaches may be related to a viral illness that she got from her 73-month-old son.  She is not currently breast-feeding.  Denies concern for pregnancy; has Nexplanon in place.  The history is provided by the patient. No language interpreter was used.  Headache      Home Medications Prior to Admission medications   Medication Sig Start Date End Date Taking? Authorizing Provider  EPINEPHrine (EPIPEN 2-PAK) 0.3 mg/0.3 mL IJ SOAJ injection Inject 0.3 mg into the muscle as needed for anaphylaxis. 03/12/21   Kalman Jewels, MD  hydrOXYzine (ATARAX) 25 MG tablet Take 1 tablet (25 mg total) by mouth every 6 (six) hours as needed for anxiety. 12/26/22   Gerrit Heck, CNM  ibuprofen (ADVIL) 600 MG tablet Take 1 tablet (600 mg total) by mouth every 6 (six) hours. 12/26/22   Gerrit Heck, CNM  loratadine (CLARITIN) 10 MG tablet Take 1 tablet (10 mg total) by mouth daily. 03/19/23   Georges Mouse, NP  sertraline (ZOLOFT) 100 MG tablet Take 1 tablet (100 mg total) by mouth daily. 02/16/23   Rolm Bookbinder, CNM  valACYclovir (VALTREX) 500 MG tablet Take 1 tablet (500 mg total) by mouth 2 (two) times daily. Patient not taking: Reported on 01/06/2023 11/17/22   Federico Flake, MD      Allergies    Other, Pineapple, Fish-derived products, and Zofran [ondansetron hcl]    Review  of Systems   Review of Systems  Neurological:  Positive for headaches.  Ten systems reviewed and are negative for acute change, except as noted in the HPI.    Physical Exam Updated Vital Signs BP 124/74 (BP Location: Right Arm)   Pulse 100   Temp 98.8 F (37.1 C)   Resp 18   Ht 5\' 1"  (1.549 m)   Wt 106 kg   SpO2 97%   BMI 44.15 kg/m   Physical Exam Vitals and nursing note reviewed.  Constitutional:      General: She is not in acute distress.    Appearance: She is well-developed. She is not diaphoretic.     Comments: Nontoxic appearing and in NAD  HENT:     Head: Normocephalic and atraumatic.  Eyes:     General: No scleral icterus.    Conjunctiva/sclera: Conjunctivae normal.  Neck:     Comments: No meningismus Pulmonary:     Effort: Pulmonary effort is normal. No respiratory distress.     Comments: Respirations even and unlabored Musculoskeletal:        General: Normal range of motion.     Cervical back: Normal range of motion.  Skin:    General: Skin is warm and dry.     Coloration: Skin is not pale.     Findings: No erythema or rash.  Neurological:     Mental Status: She is alert and oriented to person,  place, and time.     Comments: GCS 15. Speech is goal oriented. Symmetric eyebrow raise, no facial drooping. Patient moves extremities without ataxia, movements symmetric. No focal deficits noted.  Psychiatric:        Behavior: Behavior normal.     ED Results / Procedures / Treatments   Labs (all labs ordered are listed, but only abnormal results are displayed) Labs Reviewed  I-STAT BETA HCG BLOOD, ED ([facility], [facility], [facility] ONLY)    EKG None  Radiology No results found.  Procedures Procedures    Medications Ordered in ED Medications  metoCLOPramide (REGLAN) injection 10 mg (10 mg Intramuscular Given 04/27/23 0558)  ketorolac (TORADOL) 15 MG/ML injection 15 mg (15 mg Intramuscular Given 04/27/23 0558)    ED Course/ Medical Decision Making/ A&P                              Medical Decision Making Risk Prescription drug management.   This patient presents to the ED for concern of headache, this involves an extensive number of treatment options, and is a complaint that carries with it a high risk of complications and morbidity.  The differential diagnosis includes sinus headache vs migraine vs ICH vs IIH vs mass   Co morbidities that complicate the patient evaluation  GAD Asthma Gestational DM   Additional history obtained:  External records from outside source obtained and reviewed including CT head in 2022 completed for recurrent headaches; imaging normal   Lab Tests:  I Ordered, and personally interpreted labs.  The pertinent results include:  hCG negative.   Cardiac Monitoring:  The patient was maintained on a cardiac monitor.  I personally viewed and interpreted the cardiac monitored which showed an underlying rhythm of: NSR   Medicines ordered and prescription drug management:  I ordered medication including Reglan and Toradol for headache  I have reviewed the patients home medicines and have made adjustments as needed   Test Considered:  CT head   Problem List / ED Course:  Patient presents to the emergency department for evaluation of headache which began 2 days ago, recurring on shift tonight.  Patient with no history of recent head injury or trauma.  No fever, nuchal rigidity, meningismus to suggest meningitis.  Neurologic exam today is nonfocal.   Pending reassessment to assess pain control; however, given reassuring physical exam, I do not believe further emergent workup is indicated at this time.    Social Determinants of Health:  Lives independently   Dispostion:  Care signed out to [place], [place] at shift change. Anticipate discharge when symptoms better controlled.         Final Clinical Impression(s) / ED Diagnoses Final diagnoses:  Bad headache    Rx / DC Orders ED Discharge Orders      None         Antony Madura, PA-C 04/27/23 0630    Sloan Leiter, DO 04/27/23 [id number]

## 2023-04-28 ENCOUNTER — Encounter: Payer: Self-pay | Admitting: Oncology

## 2023-04-29 ENCOUNTER — Inpatient Hospital Stay: Payer: Medicaid Other

## 2023-04-29 ENCOUNTER — Telehealth: Payer: Self-pay | Admitting: Oncology

## 2023-05-04 ENCOUNTER — Other Ambulatory Visit: Payer: Self-pay

## 2023-05-04 ENCOUNTER — Inpatient Hospital Stay: Payer: Medicaid Other | Attending: Oncology

## 2023-05-04 VITALS — BP 128/80 | HR 92 | Resp 16

## 2023-05-04 DIAGNOSIS — D649 Anemia, unspecified: Secondary | ICD-10-CM | POA: Diagnosis present

## 2023-05-04 DIAGNOSIS — N938 Other specified abnormal uterine and vaginal bleeding: Secondary | ICD-10-CM | POA: Insufficient documentation

## 2023-05-04 DIAGNOSIS — D539 Nutritional anemia, unspecified: Secondary | ICD-10-CM

## 2023-05-04 MED ORDER — LORATADINE 10 MG PO TABS
10.0000 mg | ORAL_TABLET | Freq: Once | ORAL | Status: AC
  Administered 2023-05-04: 10 mg via ORAL
  Filled 2023-05-04: qty 1

## 2023-05-04 MED ORDER — ACETAMINOPHEN 325 MG PO TABS
650.0000 mg | ORAL_TABLET | Freq: Once | ORAL | Status: AC
  Administered 2023-05-04: 650 mg via ORAL
  Filled 2023-05-04: qty 2

## 2023-05-04 MED ORDER — SODIUM CHLORIDE 0.9 % IV SOLN: Freq: Once | INTRAVENOUS | Status: AC

## 2023-05-04 MED ORDER — SODIUM CHLORIDE 0.9 % IV SOLN
510.0000 mg | Freq: Once | INTRAVENOUS | Status: AC
  Administered 2023-05-04: 510 mg via INTRAVENOUS
  Filled 2023-05-04: qty 510

## 2023-05-04 NOTE — Patient Instructions (Signed)

## 2023-05-06 ENCOUNTER — Ambulatory Visit: Payer: Medicaid Other | Admitting: Nurse Practitioner

## 2023-05-07 ENCOUNTER — Encounter: Payer: Self-pay | Admitting: Oncology

## 2023-05-08 ENCOUNTER — Inpatient Hospital Stay: Payer: Medicaid Other

## 2023-05-08 ENCOUNTER — Inpatient Hospital Stay: Payer: Medicaid Other | Admitting: Oncology

## 2023-05-14 ENCOUNTER — Other Ambulatory Visit: Payer: Self-pay

## 2023-05-14 ENCOUNTER — Ambulatory Visit: Payer: Medicaid Other | Admitting: Internal Medicine

## 2023-05-14 ENCOUNTER — Other Ambulatory Visit: Payer: Self-pay | Admitting: Oncology

## 2023-05-14 DIAGNOSIS — D539 Nutritional anemia, unspecified: Secondary | ICD-10-CM
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
## 2023-05-15 ENCOUNTER — Inpatient Hospital Stay: Payer: Medicaid Other

## 2023-05-15 ENCOUNTER — Inpatient Hospital Stay: Payer: Medicaid Other | Admitting: Oncology

## 2023-05-15 ENCOUNTER — Telehealth: Payer: Self-pay

## 2023-05-15 NOTE — Telephone Encounter (Signed)
Pt was a no show for a NP appt with Ashley on 05/14/23, I did not send a no show letter. Pt blocked.     

## 2023-05-16 ENCOUNTER — Telehealth: Payer: Self-pay | Admitting: Oncology

## 2023-05-16 ENCOUNTER — Encounter: Payer: Self-pay | Admitting: Oncology

## 2023-05-16 NOTE — Telephone Encounter (Signed)
Left patient a vm regarding upcoming appointment  

## 2023-05-19 ENCOUNTER — Inpatient Hospital Stay: Payer: Medicaid Other | Attending: Oncology

## 2023-05-19 ENCOUNTER — Telehealth: Payer: Self-pay | Admitting: Physician Assistant

## 2023-05-19 ENCOUNTER — Ambulatory Visit (HOSPITAL_COMMUNITY): Payer: Medicaid Other | Admitting: Psychiatry

## 2023-05-19 DIAGNOSIS — J208 Acute bronchitis due to other specified organisms: Secondary | ICD-10-CM

## 2023-05-19 DIAGNOSIS — B9689 Other specified bacterial agents as the cause of diseases classified elsewhere: Secondary | ICD-10-CM

## 2023-05-19 MED ORDER — BENZONATATE 100 MG PO CAPS
100.0000 mg | ORAL_CAPSULE | Freq: Three times a day (TID) | ORAL | 0 refills | Status: DC | PRN
Start: 2023-05-19 — End: 2023-05-27

## 2023-05-19 MED ORDER — DOXYCYCLINE HYCLATE 100 MG PO TABS
100.0000 mg | ORAL_TABLET | Freq: Two times a day (BID) | ORAL | 0 refills | Status: DC
Start: 2023-05-19 — End: 2023-05-27
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
## 2023-05-19 NOTE — Patient Instructions (Signed)
Courtney Holloway, thank you for joining [name], PA-C for today's virtual visit.  While this provider is not your primary care provider (PCP), if your PCP is located in our provider database this encounter information will be shared with them immediately following your visit.   A [facility] MyChart account gives you access to today's visit and all your visits, tests, and labs performed at [facility] " click here if you don't have a [facility] MyChart account or go to [email or web address]  Consent: (Patient) Courtney Holloway provided verbal consent for this virtual visit at the beginning of the encounter.  Current Medications:  Current Outpatient Medications:    EPINEPHrine (EPIPEN 2-PAK) 0.3 mg/0.3 mL IJ SOAJ injection, Inject 0.3 mg into the muscle as needed for anaphylaxis., Disp: 1 each, Rfl: 1   hydrOXYzine (ATARAX) 25 MG tablet, Take 1 tablet (25 mg total) by mouth every 6 (six) hours as needed for anxiety., Disp: 30 tablet, Rfl: 0   ibuprofen (ADVIL) 600 MG tablet, Take 1 tablet (600 mg total) by mouth every 6 (six) hours., Disp: 30 tablet, Rfl: 0   loratadine (CLARITIN) 10 MG tablet, Take 1 tablet (10 mg total) by mouth daily., Disp: 90 tablet, Rfl: 0   metoCLOPramide (REGLAN) 10 MG tablet, Take 1 tablet (10 mg total) by mouth every 6 (six) hours as needed for nausea., Disp: 30 tablet, Rfl: 0   sertraline (ZOLOFT) 100 MG tablet, Take 1 tablet (100 mg total) by mouth daily., Disp: 30 tablet, Rfl: 2   valACYclovir (VALTREX) 500 MG tablet, Take 1 tablet (500 mg total) by mouth 2 (two) times daily. (Patient not taking: Reported on 01/06/2023), Disp: 60 tablet, Rfl: 6   Medications ordered in this encounter:  No orders of the defined types were placed in this encounter.    *If you need refills on other medications prior to your next appointment, please contact your pharmacy*  Follow-Up: Call back or seek an in-person evaluation if the symptoms worsen or if  the condition fails to improve as anticipated.   Virtual Care [phone]  Other Instructions Take antibiotic (Doxycycline) as directed.  Increase fluids.  Get plenty of rest. Use Mucinex for congestion. Tessalon as directed for cough. Take a daily probiotic (I recommend Align or Culturelle, but even Activia Yogurt may be beneficial).  A humidifier placed in the bedroom may offer some relief for a dry, scratchy throat of nasal irritation.  Read information below on acute bronchitis. Please call or return to clinic if symptoms are not improving.  Acute Bronchitis Bronchitis is when the airways that extend from the windpipe into the lungs get red, puffy, and painful (inflamed). Bronchitis often causes thick spit (mucus) to develop. This leads to a cough. A cough is the most common symptom of bronchitis. In acute bronchitis, the condition usually begins suddenly and goes away over time (usually in 2 weeks). Smoking, allergies, and asthma can make bronchitis worse. Repeated episodes of bronchitis may cause more lung problems.  HOME CARE Rest. Drink enough fluids to keep your pee (urine) clear or pale yellow (unless you need to limit fluids as told by your doctor). Only take over-the-counter or prescription medicines as told by your doctor. Avoid smoking and secondhand smoke. These can make bronchitis worse. If you are a smoker, think about using nicotine gum or skin patches. Quitting smoking will help your lungs heal faster. Reduce the chance of getting bronchitis again by: Washing your hands often. Avoiding people with  cold symptoms. Trying not to touch your hands to your mouth, nose, or eyes. Follow up with your doctor as told.  GET HELP IF: Your symptoms do not improve after 1 week of treatment. Symptoms include: Cough. Fever. Coughing up thick spit. Body aches. Chest congestion. Chills. Shortness of breath. Sore throat.  GET HELP RIGHT AWAY IF:  You have an increased  fever. You have chills. You have severe shortness of breath. You have bloody thick spit (sputum). You throw up (vomit) often. You lose too much body fluid (dehydration). You have a severe headache. You faint.  MAKE SURE YOU:  Understand these instructions. Will watch your condition. Will get help right away if you are not doing well or get worse. Document Released: 05/19/2008 Document Revised: 08/03/2013 Document Reviewed: 05/24/2013 [facility] Patient Information 2015 [place], [place]. This information is not intended to replace advice given to you by your health care provider. Make sure you discuss any questions you have with your health care provider.    If you have been instructed to have an in-person evaluation today at a local Urgent Care facility, please use the link below. It will take you to a list of all of our available [facility], including address, phone number and hours of operation. Please do not delay care.  [facility]  If you or a family member do not have a primary care provider, use the link below to schedule a visit and establish care. When you choose a [facility] primary care physician or advanced practice provider, you gain a long-term partner in health. Find a Primary Care Provider  Learn more about [facility] in-office and virtual care options:  - Get Care Now

## 2023-05-23 ENCOUNTER — Encounter: Payer: Self-pay | Admitting: Oncology

## 2023-05-26 ENCOUNTER — Encounter (HOSPITAL_BASED_OUTPATIENT_CLINIC_OR_DEPARTMENT_OTHER): Payer: Self-pay

## 2023-05-26 ENCOUNTER — Emergency Department (HOSPITAL_BASED_OUTPATIENT_CLINIC_OR_DEPARTMENT_OTHER): Payer: Medicaid Other | Admitting: Radiology

## 2023-05-26 ENCOUNTER — Ambulatory Visit: Payer: Medicaid Other | Admitting: Internal Medicine

## 2023-05-26 ENCOUNTER — Other Ambulatory Visit: Payer: Self-pay

## 2023-05-26 ENCOUNTER — Emergency Department (HOSPITAL_BASED_OUTPATIENT_CLINIC_OR_DEPARTMENT_OTHER)
Admission: EM | Admit: 2023-05-26 | Discharge: 2023-05-26 | Disposition: A | Discharge status: Home / Self Care | Payer: Medicaid Other | Attending: Emergency Medicine | Admitting: Emergency Medicine

## 2023-05-26 DIAGNOSIS — Y92003 Bedroom of unspecified non-institutional (private) residence as the place of occurrence of the external cause: Secondary | ICD-10-CM | POA: Insufficient documentation

## 2023-05-26 DIAGNOSIS — S60051A Contusion of right little finger without damage to nail, initial encounter: Secondary | ICD-10-CM | POA: Diagnosis not present

## 2023-05-26 DIAGNOSIS — S6991XA Unspecified injury of right wrist, hand and finger(s), initial encounter: Secondary | ICD-10-CM | POA: Diagnosis present

## 2023-05-26 DIAGNOSIS — W231XXA Caught, crushed, jammed, or pinched between stationary objects, initial encounter: Secondary | ICD-10-CM | POA: Diagnosis not present

## 2023-05-26 NOTE — ED Triage Notes (Signed)
Patient here POV from Home.  Notes injuring his Right fifth Digit against a Door 24 Hours ago. Pink and Swelling since.  NAD Noted during Triage. A&Ox4. GCS 15. Ambulatory.

## 2023-05-26 NOTE — ED Provider Notes (Signed)
[facility] EMERGENCY DEPARTMENT AT [facility] Provider Note   CSN: [id number] Arrival date & time: 05/26/23  1445     History  Chief Complaint  Patient presents with   Finger Injury    Courtney Holloway is a 22 y.o. female.  22 year old right-hand-dominant female presents with complaint of right fifth finger injury.  Patient closed her finger in her door in her bedroom yesterday.  Pain to the middle phalanx with minor overlying abrasion.  No other injuries as result of this however states that several days ago she fell down some stairs and has some bruising to her left upper arm.  Has been ambulatory since the fall without difficulty, taking Tylenol for pain.       Home Medications Prior to Admission medications   Medication Sig Start Date End Date Taking? Authorizing Provider  benzonatate (TESSALON) 100 MG capsule Take 1 capsule (100 mg total) by mouth 3 (three) times daily as needed for cough. 05/19/23   Waldon Merl, PA-C  doxycycline (VIBRA-TABS) 100 MG tablet Take 1 tablet (100 mg total) by mouth 2 (two) times daily. 05/19/23   Waldon Merl, PA-C  EPINEPHrine (EPIPEN 2-PAK) 0.3 mg/0.3 mL IJ SOAJ injection Inject 0.3 mg into the muscle as needed for anaphylaxis. 03/12/21   Kalman Jewels, MD  hydrOXYzine (ATARAX) 25 MG tablet Take 1 tablet (25 mg total) by mouth every 6 (six) hours as needed for anxiety. 12/26/22   Gerrit Heck, CNM  ibuprofen (ADVIL) 600 MG tablet Take 1 tablet (600 mg total) by mouth every 6 (six) hours. 12/26/22   Gerrit Heck, CNM  loratadine (CLARITIN) 10 MG tablet Take 1 tablet (10 mg total) by mouth daily. 03/19/23   Georges Mouse, NP  metoCLOPramide (REGLAN) 10 MG tablet Take 1 tablet (10 mg total) by mouth every 6 (six) hours as needed for nausea. 04/27/23   Al Decant, PA-C  sertraline (ZOLOFT) 100 MG tablet Take 1 tablet (100 mg total) by mouth daily. 02/16/23   Rolm Bookbinder, CNM  valACYclovir (VALTREX) 500 MG tablet Take 1  tablet (500 mg total) by mouth 2 (two) times daily. Patient not taking: Reported on 01/06/2023 11/17/22   Federico Flake, MD      Allergies    Other, Pineapple, Fish-derived products, and Zofran [ondansetron hcl]    Review of Systems   Review of Systems Negative except as per HPI Physical Exam Updated Vital Signs BP (!) 132/95 (BP Location: Right Arm)   Pulse 88   Temp (!) 96.8 F (36 C) (Temporal)   Resp 18   Ht 5\' 1"  (1.549 m)   Wt 105.2 kg   SpO2 96%   BMI 43.84 kg/m  Physical Exam Vitals and nursing note reviewed.  Constitutional:      General: She is not in acute distress.    Appearance: She is well-developed. She is not diaphoretic.  HENT:     Head: Normocephalic and atraumatic.  Cardiovascular:     Pulses: Normal pulses.  Pulmonary:     Effort: Pulmonary effort is normal.  Musculoskeletal:        General: Swelling, tenderness and signs of injury present. No deformity.       Arms:     Comments: Swelling with tenderness to the 5th finger of the right hand with minor abrasion   Skin:    General: Skin is warm and dry.     Findings: Bruising present.  Neurological:     Mental Status:  She is alert and oriented to person, place, and time.     Sensory: No sensory deficit.     Motor: No weakness.  Psychiatric:        Behavior: Behavior normal.     ED Results / Procedures / Treatments   Labs (all labs ordered are listed, but only abnormal results are displayed) Labs Reviewed - No data to display  EKG None  Radiology DG Finger Little Right  Result Date: 05/26/2023 CLINICAL DATA:  Right fifth digit injury against a door 24 hours ago. Pink and swollen. EXAM: RIGHT LITTLE FINGER 2+V COMPARISON:  Right wrist radiographs 02/16/2023, right hand radiographs 03/19/2021 FINDINGS: Normal bone mineralization. Joint spaces are preserved. No acute fracture is seen. No dislocation. IMPRESSION: No acute fracture. Electronically Signed   By: Neita Garnet M.D.   On:  05/26/2023 16:30    Procedures Procedures    Medications Ordered in ED Medications - No data to display  ED Course/ Medical Decision Making/ A&P                             Medical Decision Making Amount and/or Complexity of Data Reviewed Radiology: ordered.   22 yo female with right 5th finger pain after closing finger in a door yesterday. Will obtain XR. X-ray of the right fifth finger as ordered interpreted myself is negative for acute bony injury.  Agree with radiologist interpretation.  Plan is to buddy tape finger, can apply ice as needed for pain, Motrin and Tylenol.  Recheck with PCP in 1 week if not improving.        Final Clinical Impression(s) / ED Diagnoses Final diagnoses:  Contusion of right little finger without damage to nail, initial encounter    Rx / DC Orders ED Discharge Orders     None         Jeannie Fend, PA-C 05/26/23 1641    Charlynne Pander, MD 05/26/23 [phone]

## 2023-05-26 NOTE — ED Notes (Signed)
RN reviewed discharge instructions with pt. Pt verbalized understanding and had no further questions. VSS upon discharge.  

## 2023-05-26 NOTE — Discharge Instructions (Signed)
Buddy tape 4th and 5th fingers for comfort. Take motrin and tylenol as needed as directed for pain. Apply ice for 20 minutes at a time as needed. Recheck with your PCP if pain does not improve in the next week.

## 2023-05-27 ENCOUNTER — Encounter: Payer: Self-pay | Admitting: Family Medicine

## 2023-05-27 ENCOUNTER — Ambulatory Visit (INDEPENDENT_AMBULATORY_CARE_PROVIDER_SITE_OTHER): Payer: Medicaid Other | Admitting: Family Medicine

## 2023-05-27 ENCOUNTER — Other Ambulatory Visit (HOSPITAL_COMMUNITY)
Admission: RE | Admit: 2023-05-27 | Discharge: 2023-05-27 | Disposition: A | Discharge status: Home / Self Care | Payer: Medicaid Other | Source: Ambulatory Visit | Attending: Family Medicine | Admitting: Family Medicine

## 2023-05-27 VITALS — BP 130/82 | HR 90 | Temp 98.5°F | Ht 62.25 in | Wt 238.2 lb

## 2023-05-27 DIAGNOSIS — R110 Nausea: Secondary | ICD-10-CM

## 2023-05-27 DIAGNOSIS — R7303 Prediabetes: Secondary | ICD-10-CM

## 2023-05-27 DIAGNOSIS — Z209 Contact with and (suspected) exposure to unspecified communicable disease: Secondary | ICD-10-CM

## 2023-05-27 DIAGNOSIS — N760 Acute vaginitis: Secondary | ICD-10-CM

## 2023-05-27 DIAGNOSIS — B9689 Other specified bacterial agents as the cause of diseases classified elsewhere: Secondary | ICD-10-CM

## 2023-05-27 DIAGNOSIS — F332 Major depressive disorder, recurrent severe without psychotic features: Secondary | ICD-10-CM

## 2023-05-27 DIAGNOSIS — F902 Attention-deficit hyperactivity disorder, combined type: Secondary | ICD-10-CM

## 2023-05-27 LAB — POCT GLYCOSYLATED HEMOGLOBIN (HGB A1C): Hemoglobin A1C: 5.7 % — AB (ref 4.0–5.6)

## 2023-05-27 LAB — TSH: TSH: 1.72 u[IU]/mL (ref 0.40–5.00)

## 2023-05-27 MED ORDER — VENLAFAXINE HCL ER 37.5 MG PO CP24
37.5000 mg | ORAL_CAPSULE | Freq: Every day | ORAL | 0 refills | Status: DC
Start: 2023-05-27 — End: 2023-06-30

## 2023-05-27 MED ORDER — METHYLPHENIDATE HCL ER (OSM) 27 MG PO TBCR
27.0000 mg | EXTENDED_RELEASE_TABLET | ORAL | 0 refills | Status: DC
Start: 2023-05-27 — End: 2023-06-30

## 2023-05-27 MED ORDER — ONDANSETRON HCL 4 MG PO TABS: 4.0000 mg | ORAL_TABLET | Freq: Three times a day (TID) | ORAL | 0 refills | Status: DC | PRN

## 2023-05-27 MED ORDER — VENLAFAXINE HCL ER 75 MG PO CP24
75.0000 mg | ORAL_CAPSULE | Freq: Every day | ORAL | 1 refills | Status: DC
Start: 2023-05-27 — End: 2023-06-30

## 2023-05-27 NOTE — Assessment & Plan Note (Signed)
A1C today is 5.7, pt states she has tried metformin in the past however had GI upset with this also, will continue to monitor A1C every 6 months. Checking TSH today also.

## 2023-05-27 NOTE — Assessment & Plan Note (Signed)
With comorbid conditions of severe anxiety/ depression. We had a long discussion about putting her back on the concerta for ADHD management and that this would actually help her anxiety/ depression symptoms. Patient had also expressed concerns about weight gain/ inability to lose her pregnancy weight. I advised that putting her back on Concerta will help with appetite suppression. Pt is agreeable. Will start 27 mg daily and see back in 1 month

## 2023-05-27 NOTE — Assessment & Plan Note (Signed)
Severe with multiple symptoms, patient is also post partum so this is likely exacerbating her symptoms. She has tried many different SSRI's and medications to help with depression, however she seems to have GI side effects with most of these. I recommended starting effexor 37.5 mg daily for 14 days, then increasing to 75 mg daily. I believe that she will likely need a mood stabilizer like lamotrigine in the future, especially if she cannot tolerate SNRI's/ SSRI's. Will see in a video visit in 1 month to reassess her symptoms. I advised that she call around to different therapists to see who is accepting medicaid.
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
## 2023-05-28 LAB — CERVICOVAGINAL ANCILLARY ONLY
Bacterial Vaginitis (gardnerella): POSITIVE — AB
Candida Glabrata: NEGATIVE
Candida Vaginitis: NEGATIVE
Chlamydia: NEGATIVE
Comment: NEGATIVE
Comment: NEGATIVE
Comment: NEGATIVE
Comment: NEGATIVE
Comment: NEGATIVE
Comment: NORMAL
Neisseria Gonorrhea: NEGATIVE
Trichomonas: NEGATIVE

## 2023-05-28 MED ORDER — METRONIDAZOLE 500 MG PO TABS
500.0000 mg | ORAL_TABLET | Freq: Two times a day (BID) | ORAL | 0 refills | Status: AC
Start: 2023-05-28 — End: 2023-06-04

## 2023-05-28 NOTE — Addendum Note (Signed)
Addended by: Karie Georges on: 05/28/2023 04:22 PM   Modules accepted: Orders

## 2023-06-04 ENCOUNTER — Ambulatory Visit: Payer: Self-pay | Admitting: Family Medicine

## 2023-06-15 ENCOUNTER — Encounter: Payer: Self-pay | Admitting: Oncology

## 2023-06-15 ENCOUNTER — Ambulatory Visit (HOSPITAL_COMMUNITY): Payer: Medicaid Other | Admitting: Psychiatry

## 2023-06-15 ENCOUNTER — Telehealth: Payer: Self-pay | Admitting: *Deleted

## 2023-06-15 DIAGNOSIS — F332 Major depressive disorder, recurrent severe without psychotic features: Secondary | ICD-10-CM

## 2023-06-15 NOTE — Telephone Encounter (Signed)
-----   Message from Karie Georges, MD sent at 06/15/2023 11:07 AM EDT ----- Regarding: FW: Psychology referral Please place referral to psychology for therapy services. Thanks! ----- Message ----- From: Elsie Lincoln, MD Sent: 06/15/2023   8:23 AM EDT To: Karie Georges, MD Subject: Psychology referral                            Dr. Casimiro Needle,  Unfortunately Midge was not interested in medication management from psychiatry (me). She was interested in psychotherapy however. Would you be able to place a referral to the [facility] for psychology for her? My clinic operates out of [place] so wouldn't work for therapy services here.  Thanks, Sam

## 2023-06-15 NOTE — Telephone Encounter (Signed)
Referral placed as below.  

## 2023-06-19 ENCOUNTER — Encounter: Payer: Self-pay | Admitting: Oncology

## 2023-06-23 ENCOUNTER — Ambulatory Visit: Payer: Self-pay | Admitting: Family Medicine

## 2023-06-25 ENCOUNTER — Other Ambulatory Visit: Payer: Self-pay | Admitting: Oncology

## 2023-06-25 DIAGNOSIS — D539 Nutritional anemia, unspecified: Secondary | ICD-10-CM
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
## 2023-06-26 ENCOUNTER — Inpatient Hospital Stay: Payer: Medicaid Other

## 2023-06-26 ENCOUNTER — Telehealth: Payer: Self-pay | Admitting: Oncology

## 2023-06-26 ENCOUNTER — Inpatient Hospital Stay: Payer: Medicaid Other | Admitting: Oncology

## 2023-06-30 ENCOUNTER — Institutional Professional Consult (permissible substitution): Payer: Medicaid Other | Admitting: Licensed Clinical Social Worker

## 2023-06-30 ENCOUNTER — Ambulatory Visit: Payer: Medicaid Other | Admitting: Family Medicine

## 2023-06-30 ENCOUNTER — Encounter: Payer: Self-pay | Admitting: Family Medicine

## 2023-06-30 DIAGNOSIS — F332 Major depressive disorder, recurrent severe without psychotic features: Secondary | ICD-10-CM

## 2023-06-30 MED ORDER — TRAZODONE HCL 50 MG PO TABS
25.0000 mg | ORAL_TABLET | Freq: Every evening | ORAL | 3 refills | Status: DC | PRN
Start: 2023-06-30 — End: 2023-09-23

## 2023-06-30 MED ORDER — ARIPIPRAZOLE 5 MG PO TABS
5.0000 mg | ORAL_TABLET | Freq: Every day | ORAL | 0 refills | Status: DC
Start: 2023-06-30 — End: 2023-07-14

## 2023-06-30 NOTE — Telephone Encounter (Signed)
Ok thanks! I'll be sure to send a response.

## 2023-06-30 NOTE — Telephone Encounter (Signed)
I called the patient, informed her she should be seen and offered to change the visit to a virtual appt today.  Patient stated she has to go to work and she sent her concerns via message.  Virtual visit was scheduled for 7/18 and message forwarded to PCP.

## 2023-07-02 ENCOUNTER — Telehealth: Payer: Medicaid Other | Admitting: Family Medicine

## 2023-07-02 ENCOUNTER — Inpatient Hospital Stay: Payer: Medicaid Other | Attending: Oncology

## 2023-07-02 ENCOUNTER — Other Ambulatory Visit: Payer: Self-pay | Admitting: Oncology

## 2023-07-02 ENCOUNTER — Other Ambulatory Visit: Payer: Self-pay

## 2023-07-02 VITALS — BP 100/67 | HR 86 | Resp 18

## 2023-07-02 DIAGNOSIS — D649 Anemia, unspecified: Secondary | ICD-10-CM | POA: Diagnosis present

## 2023-07-02 DIAGNOSIS — F332 Major depressive disorder, recurrent severe without psychotic features: Secondary | ICD-10-CM

## 2023-07-02 DIAGNOSIS — D539 Nutritional anemia, unspecified: Secondary | ICD-10-CM

## 2023-07-02 MED ORDER — LORATADINE 10 MG PO TABS
10.0000 mg | ORAL_TABLET | Freq: Once | ORAL | Status: AC
  Administered 2023-07-02: 10 mg via ORAL
  Filled 2023-07-02: qty 1

## 2023-07-02 MED ORDER — SODIUM CHLORIDE 0.9 % IV SOLN
510.0000 mg | Freq: Once | INTRAVENOUS | Status: AC
  Administered 2023-07-02: 510 mg via INTRAVENOUS
  Filled 2023-07-02: qty 510

## 2023-07-02 MED ORDER — ACETAMINOPHEN 325 MG PO TABS
650.0000 mg | ORAL_TABLET | Freq: Once | ORAL | Status: AC
  Administered 2023-07-02: 650 mg via ORAL
  Filled 2023-07-02: qty 2

## 2023-07-02 MED ORDER — SODIUM CHLORIDE 0.9 % IV SOLN: Freq: Once | INTRAVENOUS | Status: AC
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
## 2023-07-02 NOTE — Patient Instructions (Signed)
Ferumoxytol Injection What is this medication? FERUMOXYTOL (FER ue MOX i tol) treats low levels of iron in your body (iron deficiency anemia). Iron is a mineral that plays an important role in making red blood cells, which carry oxygen from your lungs to the rest of your body. This medicine may be used for other purposes; ask your health care provider or pharmacist if you have questions. COMMON BRAND NAME(S): Feraheme What should I tell my care team before I take this medication? They need to know if you have any of these conditions: Anemia not caused by low iron levels High levels of iron in the blood Magnetic resonance imaging (MRI) test scheduled An unusual or allergic reaction to iron, other medications, foods, dyes, or preservatives Pregnant or trying to get pregnant Breastfeeding How should I use this medication? This medication is injected into a vein. It is given by your care team in a hospital or clinic setting. Talk to your care team the use of this medication in children. Special care may be needed. Overdosage: If you think you have taken too much of this medicine contact a poison control center or emergency room at once. NOTE: This medicine is only for you. Do not share this medicine with others. What if I miss a dose? It is important not to miss your dose. Call your care team if you are unable to keep an appointment. What may interact with this medication? Other iron products This list may not describe all possible interactions. Give your health care provider a list of all the medicines, herbs, non-prescription drugs, or dietary supplements you use. Also tell them if you smoke, drink alcohol, or use illegal drugs. Some items may interact with your medicine. What should I watch for while using this medication? Visit your care team regularly. Tell your care team if your symptoms do not start to get better or if they get worse. You may need blood work done while you are taking this  medication. You may need to follow a special diet. Talk to your care team. Foods that contain iron include: whole grains/cereals, dried fruits, beans, or peas, leafy green vegetables, and organ meats (liver, kidney). What side effects may I notice from receiving this medication? Side effects that you should report to your care team as soon as possible: Allergic reactions--skin rash, itching, hives, swelling of the face, lips, tongue, or throat Low blood pressure--dizziness, feeling faint or lightheaded, blurry vision Shortness of breath Side effects that usually do not require medical attention (report to your care team if they continue or are bothersome): Flushing Headache Joint pain Muscle pain Nausea Pain, redness, or irritation at injection site This list may not describe all possible side effects. Call your doctor for medical advice about side effects. You may report side effects to FDA at 1-800-FDA-1088. Where should I keep my medication? This medication is given in a hospital or clinic and will not be stored at home. NOTE: This sheet is a summary. It may not cover all possible information. If you have questions about this medicine, talk to your doctor, pharmacist, or health care provider.  2024 Elsevier/Gold Standard (2022-06-09 00:00:00)  

## 2023-07-02 NOTE — Progress Notes (Signed)
Pt requested to cancel the visit since I already adjusted her medication over MyChart. Will reschedule her for 1 month follow up.

## 2023-07-02 NOTE — Progress Notes (Signed)
Patient declined post observation.  Tolerated treatment well, stable at discharge.  Ambulated to lobby.

## 2023-07-03 ENCOUNTER — Ambulatory Visit: Payer: Medicaid Other | Admitting: Oncology

## 2023-07-03 ENCOUNTER — Encounter: Payer: Self-pay | Admitting: Oncology

## 2023-07-03 ENCOUNTER — Inpatient Hospital Stay: Payer: Medicaid Other

## 2023-07-03 ENCOUNTER — Inpatient Hospital Stay: Payer: Medicaid Other | Admitting: Oncology

## 2023-07-03 ENCOUNTER — Other Ambulatory Visit: Payer: Medicaid Other

## 2023-07-06 ENCOUNTER — Institutional Professional Consult (permissible substitution): Payer: Medicaid Other | Admitting: Licensed Clinical Social Worker

## 2023-07-14 MED ORDER — ARIPIPRAZOLE 5 MG PO TABS
5.0000 mg | ORAL_TABLET | Freq: Every day | ORAL | 0 refills | Status: DC
Start: 2023-07-14 — End: 2023-09-23

## 2023-07-14 NOTE — Telephone Encounter (Signed)
Ok I sent it to the [facility] on [address]

## 2023-07-16 ENCOUNTER — Encounter: Payer: Self-pay | Admitting: Oncology

## 2023-07-17 ENCOUNTER — Institutional Professional Consult (permissible substitution): Payer: Medicaid Other | Admitting: Licensed Clinical Social Worker

## 2023-07-21 ENCOUNTER — Institutional Professional Consult (permissible substitution): Payer: Medicaid Other | Admitting: Licensed Clinical Social Worker

## 2023-07-31 ENCOUNTER — Encounter: Payer: Self-pay | Admitting: Oncology

## 2023-08-03 ENCOUNTER — Telehealth: Payer: Self-pay | Admitting: Licensed Clinical Social Worker

## 2023-08-03 IMAGING — US US OB TRANSVAGINAL
1 series · 15 of 28 positions shown · non-contrast
Comparison: Obstetric ultrasound 05/18/2022.

CLINICAL DATA: Vaginal bleeding. First trimester pregnancy. Beta
HCG levels are pending.

EXAM:
TRANSVAGINAL OB ULTRASOUND
TECHNIQUE: Transvaginal ultrasound was performed for complete evaluation of the
gestation as well as the maternal uterus, adnexal regions, and
pelvic cul-de-sac.

[Series 1: us ob transvaginal · 50 acquisitions, 15 frames shown]
[im 1/50]
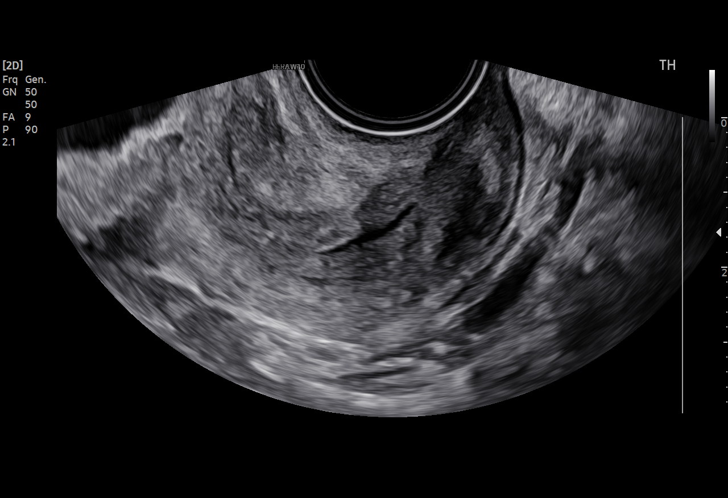
[im 4/50]
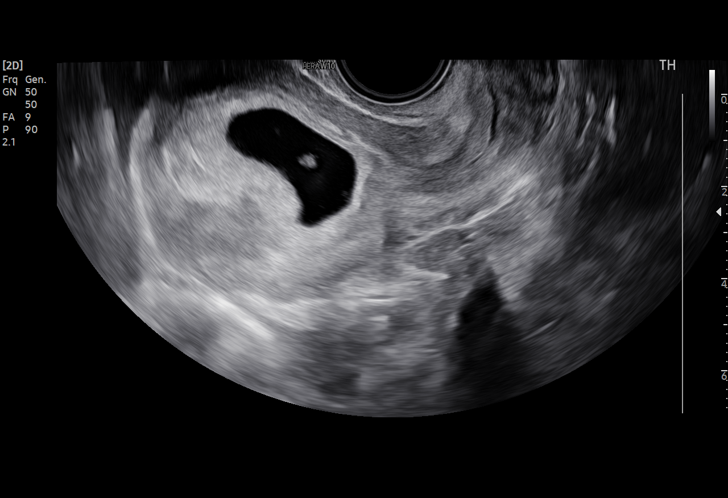
[im 8/50]
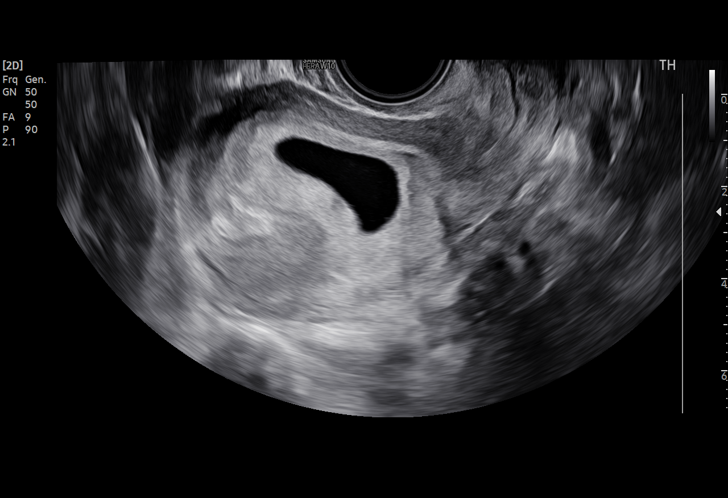
[im 11/50]
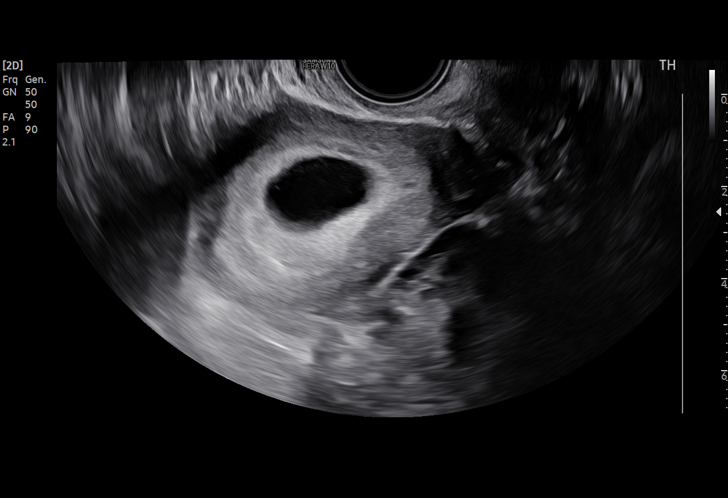
[im 15/50]
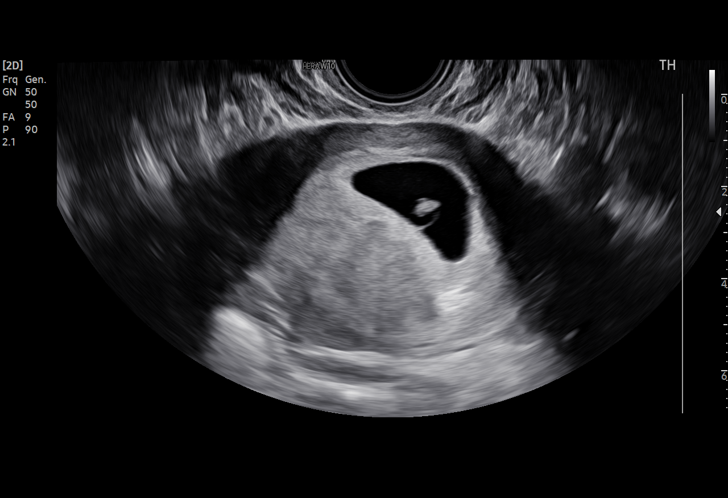
[im 19/50]
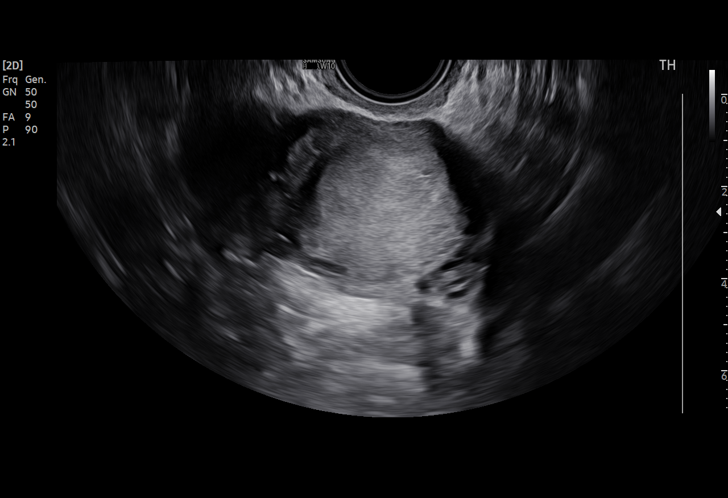
[im 22/50]
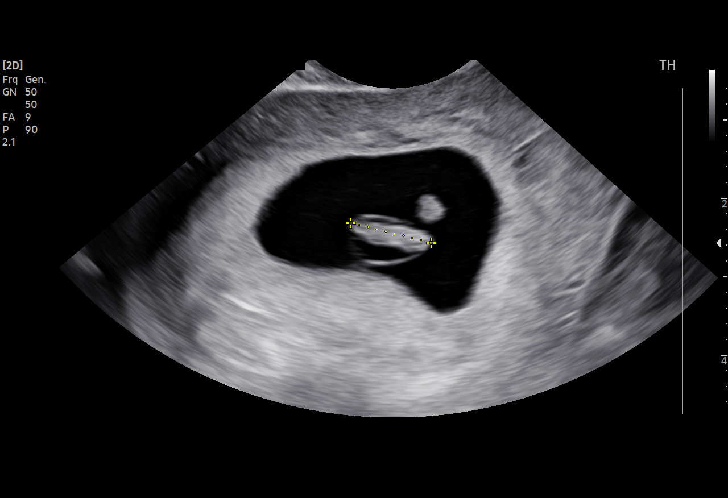
[im 26/50]
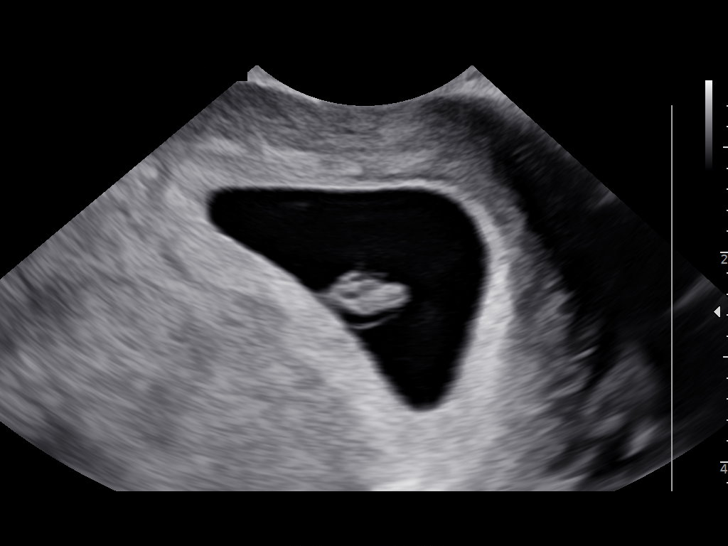
[im 28/50]
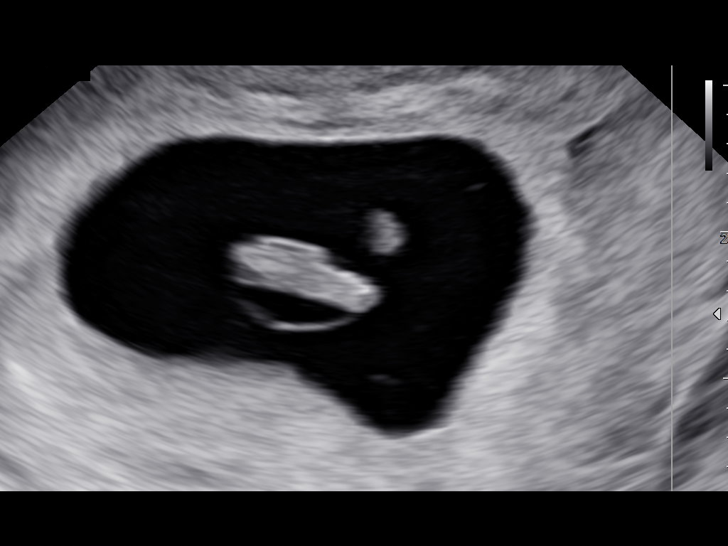
[im 31/50]
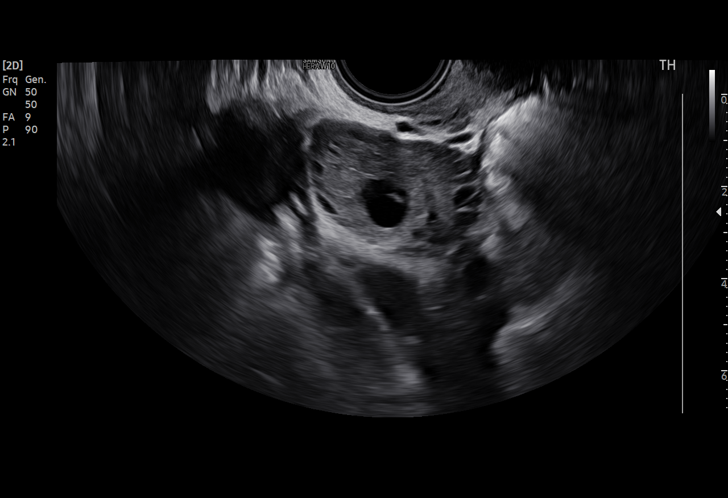
[im 35/50]
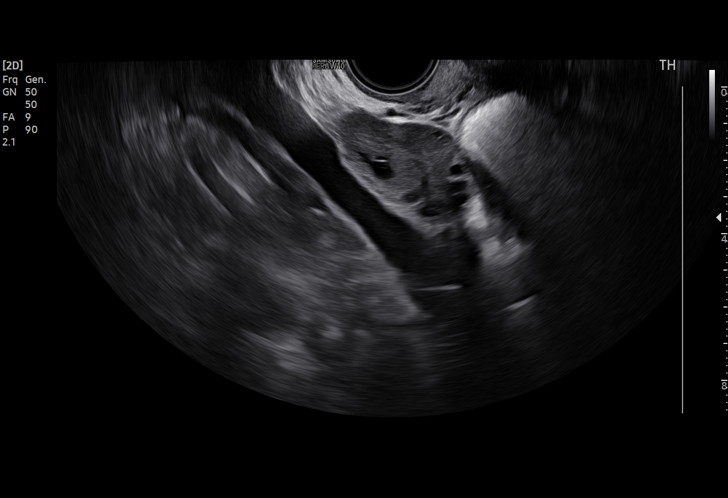
[im 39/50]
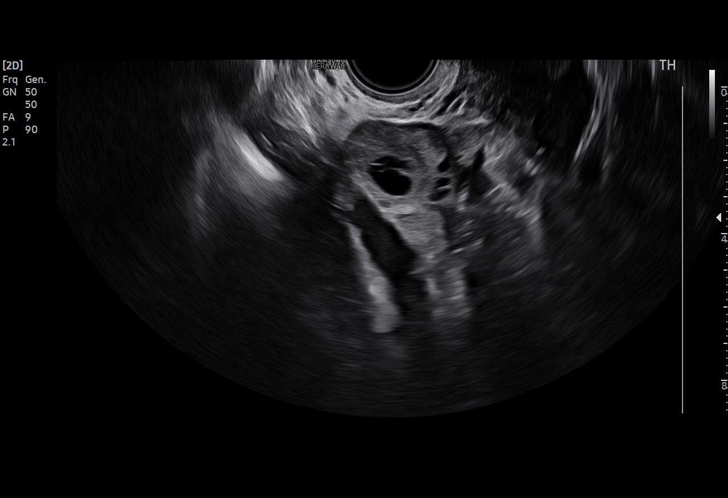
[im 42/50]
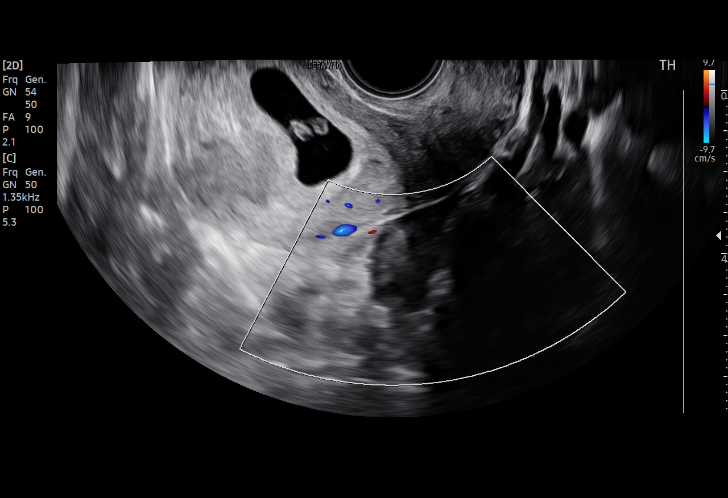
[im 46/50]
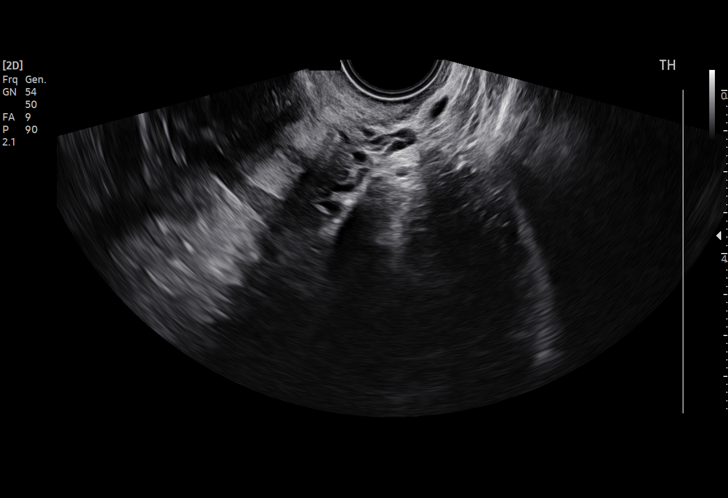
[im 50/50]
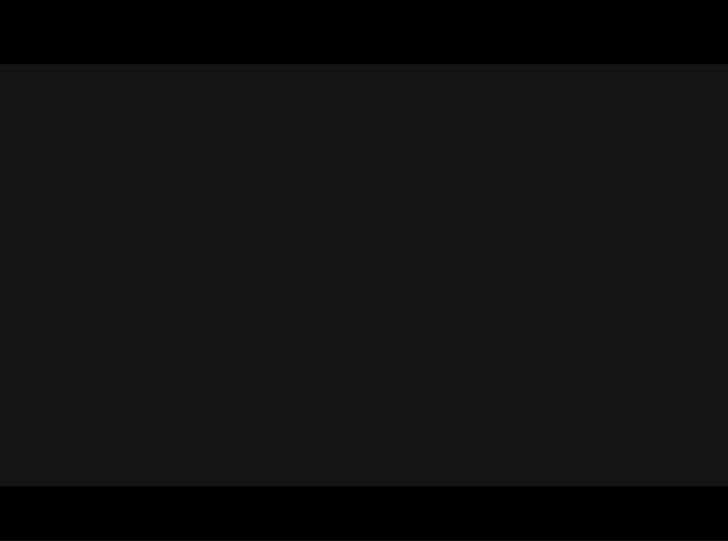

[15 of 28 positions shown; findings below may reference images not displayed]

FINDINGS: Intrauterine gestational sac: Visualized/normal in shape.

Yolk sac:  Visualized.

Embryo:  Visualized.

Cardiac Activity: Visualized.

Heart Rate: 138 bpm

CRL:  10.3 mm; 7 w 1 d;                  US EDC: 01/11/2023

Subchorionic hemorrhage: None.  Unfused amnion.

Maternal uterus/adnexae: Both maternal ovaries are visualized. Trace
free pelvic fluid. No adnexal mass.
IMPRESSION: 1. Single live intrauterine pregnancy with best estimated
gestational age of 7 weeks 1 day.
2. No adnexal mass or acute findings identified.

## 2023-08-03 NOTE — Telephone Encounter (Signed)
TC made to patient in regards to rescheduling missed [facility] appointment. Appt rescheduled for 8/20/4 at 1pm

## 2023-08-04 ENCOUNTER — Ambulatory Visit (INDEPENDENT_AMBULATORY_CARE_PROVIDER_SITE_OTHER): Payer: Medicaid Other | Admitting: Licensed Clinical Social Worker

## 2023-08-04 DIAGNOSIS — F4323 Adjustment disorder with mixed anxiety and depressed mood: Secondary | ICD-10-CM
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
## 2023-08-05 ENCOUNTER — Telehealth: Payer: Self-pay | Admitting: Family Medicine

## 2023-08-05 NOTE — Telephone Encounter (Signed)
Good morning,  Patient called with a question. Please give her a call back when you can. Thanks!

## 2023-08-05 NOTE — Telephone Encounter (Signed)
Good morning! Will do, thank you!

## 2023-08-19 ENCOUNTER — Ambulatory Visit: Payer: Medicaid Other | Admitting: Licensed Clinical Social Worker

## 2023-08-19 DIAGNOSIS — Z91199 Patient's noncompliance with other medical treatment and regimen due to unspecified reason: Secondary | ICD-10-CM

## 2023-08-19 NOTE — BH Specialist Note (Signed)
Patient no showed to this appointment today. Virtual link was sent twice to the number in the chart, a call was provided and a VM was left encouraging patient to reschedule appointment.

## 2023-08-21 ENCOUNTER — Encounter: Payer: Self-pay | Admitting: Family Medicine

## 2023-08-21 NOTE — Telephone Encounter (Signed)
Noted  

## 2023-08-26 ENCOUNTER — Ambulatory Visit: Payer: Medicaid Other | Admitting: Family Medicine

## 2023-08-26 ENCOUNTER — Encounter: Payer: Self-pay | Admitting: Family Medicine

## 2023-08-27 ENCOUNTER — Ambulatory Visit: Payer: Medicaid Other | Admitting: Family Medicine

## 2023-08-31 ENCOUNTER — Ambulatory Visit: Payer: Medicaid Other | Admitting: Family Medicine

## 2023-09-01 ENCOUNTER — Encounter (HOSPITAL_BASED_OUTPATIENT_CLINIC_OR_DEPARTMENT_OTHER): Payer: Self-pay

## 2023-09-01 ENCOUNTER — Other Ambulatory Visit: Payer: Self-pay

## 2023-09-01 ENCOUNTER — Emergency Department (HOSPITAL_BASED_OUTPATIENT_CLINIC_OR_DEPARTMENT_OTHER)
Admission: EM | Admit: 2023-09-01 | Discharge: 2023-09-01 | Disposition: A | Discharge status: Home / Self Care | Payer: Medicaid Other | Attending: Emergency Medicine | Admitting: Emergency Medicine

## 2023-09-01 DIAGNOSIS — J039 Acute tonsillitis, unspecified: Secondary | ICD-10-CM | POA: Insufficient documentation

## 2023-09-01 DIAGNOSIS — B9789 Other viral agents as the cause of diseases classified elsewhere: Secondary | ICD-10-CM

## 2023-09-01 DIAGNOSIS — Z20822 Contact with and (suspected) exposure to covid-19: Secondary | ICD-10-CM | POA: Insufficient documentation

## 2023-09-01 DIAGNOSIS — R059 Cough, unspecified: Secondary | ICD-10-CM | POA: Diagnosis present

## 2023-09-01 LAB — GROUP A STREP BY PCR: Group A Strep by PCR: NOT DETECTED

## 2023-09-01 LAB — RESP PANEL BY RT-PCR (RSV, FLU A&B, COVID)  RVPGX2
Influenza A by PCR: NEGATIVE
Influenza B by PCR: NEGATIVE
Resp Syncytial Virus by PCR: NEGATIVE
SARS Coronavirus 2 by RT PCR: NEGATIVE

## 2023-09-01 LAB — MONONUCLEOSIS SCREEN: Mono Screen: NEGATIVE

## 2023-09-01 MED ORDER — ACETAMINOPHEN 325 MG PO TABS
650.0000 mg | ORAL_TABLET | Freq: Once | ORAL | Status: DC | PRN
  Filled 2023-09-01: qty 2

## 2023-09-01 MED ORDER — DEXAMETHASONE SODIUM PHOSPHATE 10 MG/ML IJ SOLN
10.0000 mg | Freq: Once | INTRAMUSCULAR | Status: AC
  Administered 2023-09-01: 10 mg via INTRAMUSCULAR
  Filled 2023-09-01: qty 1

## 2023-09-01 MED ORDER — DEXAMETHASONE 1 MG/ML PO CONC: 10.0000 mg | Freq: Once | ORAL | Status: DC

## 2023-09-01 MED ORDER — LIDOCAINE VISCOUS HCL 2 % MT SOLN: 15.0000 mL | OROMUCOSAL | 0 refills | Status: DC | PRN

## 2023-09-01 NOTE — ED Triage Notes (Signed)
Pt POV from home reporting flulike symptoms that began today. Reports baby has been sick for the past week. Has been taking cold and flu meds today with no relief. Denies fever

## 2023-09-01 NOTE — ED Provider Notes (Signed)
[place] [facility] AT [facility] Provider Note   CSN: [id number] Arrival date & time: 09/01/23  1722     History  Chief Complaint  Patient presents with   flu like symptoms    Courtney Holloway is a 22 y.o. female with overall noncontributory past medical history who presents with concern for sore throat, body aches, cough, congestion for the last 3 days.  Reports her child has been sick for the past week.  She has not had any fever prior to arrival in the emergency department where she does have a temperature of 100.5.  She denies any shortness of breath, chest pain.  HPI     Home Medications Prior to Admission medications   Medication Sig Start Date End Date Taking? Authorizing Provider  lidocaine (XYLOCAINE) 2 % solution Use as directed 15 mLs in the mouth or throat as needed for mouth pain. 09/01/23  Yes Sukhman Martine H, PA-C  ARIPiprazole (ABILIFY) 5 MG tablet Take 1 tablet (5 mg total) by mouth daily. 07/14/23   Karie Georges, MD  etonogestrel (NEXPLANON) 68 MG IMPL implant 1 each by Subdermal route once.    [provider]  ondansetron (ZOFRAN) 4 MG tablet Take 1 tablet (4 mg total) by mouth every 8 (eight) hours as needed for nausea. 05/27/23   Karie Georges, MD  traZODone (DESYREL) 50 MG tablet Take 0.5-1 tablets (25-50 mg total) by mouth at bedtime as needed for sleep. 06/30/23   Karie Georges, MD      Allergies    Other, Pineapple, Fish-derived products, and Zofran [ondansetron hcl]    Review of Systems   Review of Systems  All other systems reviewed and are negative.   Physical Exam Updated Vital Signs BP 112/64   Pulse (!) 106   Temp 99.5 F (37.5 C) (Oral)   Resp 18   SpO2 99%  Physical Exam Vitals and nursing note reviewed.  Constitutional:      General: She is not in acute distress.    Appearance: Normal appearance.  HENT:     Head: Normocephalic and atraumatic.     Mouth/Throat:      Comments: 2-3+ tonsils bilaterally, they are symmetric.  Uvula is midline.  Intact swallow.  She has some tenderness and swelling of cervical lymph nodes bilaterally.  No floor of mouth redness, difficulty swallowing, no dysphonia. Eyes:     General:        Right eye: No discharge.        Left eye: No discharge.  Cardiovascular:     Rate and Rhythm: Normal rate and regular rhythm.     Heart sounds: No murmur heard.    No friction rub. No gallop.  Pulmonary:     Effort: Pulmonary effort is normal.     Breath sounds: Normal breath sounds.  Abdominal:     General: Bowel sounds are normal.     Palpations: Abdomen is soft.  Skin:    General: Skin is warm and dry.     Capillary Refill: Capillary refill takes less than 2 seconds.  Neurological:     Mental Status: She is alert and oriented to person, place, and time.  Psychiatric:        Mood and Affect: Mood normal.        Behavior: Behavior normal.     ED Results / Procedures / Treatments   Labs (all labs ordered are listed, but only abnormal results are displayed) Labs Reviewed  RESP PANEL BY RT-PCR (RSV, FLU A&B, COVID)  [id number]  GROUP A STREP BY PCR  MONONUCLEOSIS SCREEN    EKG None  Radiology No results found.  Procedures Procedures    Medications Ordered in ED Medications  acetaminophen (TYLENOL) tablet 650 mg (has no administration in time range)  dexamethasone (DECADRON) injection 10 mg (has no administration in time range)    ED Course/ Medical Decision Making/ A&P                                 Medical Decision Making Amount and/or Complexity of Data Reviewed Labs: ordered.  Risk OTC drugs.   This is a well-appearing 22yo female who presents with concern for 3 days of cough, fever, sore throat, bodyaches.  My emergent differential diagnosis includes acute upper respiratory infection with COVID, flu, RSV versus new asthma presentation, acute bronchitis, less clinical concern for pneumonia.  Also  considered other ENT emergencies, Ludwig angina, strep pharyngitis, mono, versus epiglottis, tonsillitis versus other.  This is not an exhaustive differential.  On my exam patient is overall well-appearing, they have temperature of 100.5, improved to 99.5 after Tylenol.  Breathing unlabored, no tachypnea, no respiratory distress, stable oxygen saturation.  Patient with mild tachycardia, pulse 106, improved on reevaluation.  Bilateral TMs are clear.  RVP independently reviewed by myself shows negative for COVID, flu, RSV, mono negative, strep negative..  Patient symptoms are consistent with viral tonsillitis.  Administered IM Decadron, will discharge with this lidocaine. encouraged ibuprofen, Tylenol, rest, plenty of fluids.  Discussed extensive return precautions.  Patient discharged in stable condition at this time.  Final Clinical Impression(s) / ED Diagnoses Final diagnoses:  Acute viral tonsillitis    Rx / DC Orders ED Discharge Orders          Ordered    lidocaine (XYLOCAINE) 2 % solution  As needed        09/01/23 2126              [place] 09/01/23 2127    Jacalyn Lefevre, MD 09/01/23 2211

## 2023-09-01 NOTE — Discharge Instructions (Addendum)
Please use Tylenol or ibuprofen for pain.  You may use 600 mg ibuprofen every 6 hours or 1000 mg of Tylenol every 6 hours.  You may choose to alternate between the 2.  This would be most effective.  Not to exceed 4 g of Tylenol within 24 hours.  Not to exceed 3200 mg ibuprofen 24 hours.  

## 2023-09-03 ENCOUNTER — Encounter: Payer: Self-pay | Admitting: Family Medicine

## 2023-09-03 ENCOUNTER — Ambulatory Visit: Payer: Medicaid Other | Admitting: Family Medicine

## 2023-09-03 ENCOUNTER — Other Ambulatory Visit (HOSPITAL_COMMUNITY)
Admission: RE | Admit: 2023-09-03 | Discharge: 2023-09-03 | Disposition: A | Discharge status: Home / Self Care | Payer: Medicaid Other | Source: Ambulatory Visit | Attending: Family Medicine | Admitting: Family Medicine

## 2023-09-03 ENCOUNTER — Ambulatory Visit (INDEPENDENT_AMBULATORY_CARE_PROVIDER_SITE_OTHER): Payer: Medicaid Other | Admitting: Family Medicine

## 2023-09-03 VITALS — BP 124/78 | HR 85 | Temp 97.8°F | Ht 62.25 in | Wt 236.2 lb

## 2023-09-03 DIAGNOSIS — Z202 Contact with and (suspected) exposure to infections with a predominantly sexual mode of transmission: Secondary | ICD-10-CM | POA: Diagnosis present

## 2023-09-03 DIAGNOSIS — R059 Cough, unspecified: Secondary | ICD-10-CM

## 2023-09-03 DIAGNOSIS — H66003 Acute suppurative otitis media without spontaneous rupture of ear drum, bilateral: Secondary | ICD-10-CM | POA: Diagnosis not present

## 2023-09-03 LAB — POC COVID19 BINAXNOW: SARS Coronavirus 2 Ag: NEGATIVE

## 2023-09-03 MED ORDER — IBUPROFEN 600 MG PO TABS: 600.0000 mg | ORAL_TABLET | Freq: Three times a day (TID) | ORAL | 0 refills | Status: DC | PRN

## 2023-09-03 MED ORDER — AMOXICILLIN 875 MG PO TABS: 875.0000 mg | ORAL_TABLET | Freq: Two times a day (BID) | ORAL | 0 refills | Status: AC

## 2023-09-03 MED ORDER — PROMETHAZINE-DM 6.25-15 MG/5ML PO SYRP: 5.0000 mL | ORAL_SOLUTION | Freq: Four times a day (QID) | ORAL | 0 refills | Status: DC | PRN

## 2023-09-03 NOTE — Patient Instructions (Signed)
Follow up as needed We'll notify you of your lab results and make any changes if needed START the Amoxicillin twice daily for your ear infection USE the cough syrup as needed TAKE the ibuprofen for pain or fever Drink LOTS of water REST! If not feeling better by next week, please schedule an appointment with your regular doctor Call with any questions or concerns Hang in there!!

## 2023-09-03 NOTE — Progress Notes (Signed)
Subjective:    Patient ID: Courtney Holloway, female    DOB: Sep 14, 2001, 22 y.o.   MRN: [id number]  HPI Cough- sxs started ~5 days ago w/ sore throat, body aches, congestion, HA.  Now has SOB w/ exertion.  Was seen in ER on 9/17 and tested negative for COVID, flu, RSV, strep, mono.  + sick contacts- son has a double ear infxn.  Pt reports throat is so painful that she received a steroid injection but this only helped temporarily.  Having bilateral ear pain.  Had a fever earlier in the illness but not currently.  Cough is productive of green sputum.  STI- possible exposure to STD.  Has an outbreak on her bottom- concerned for HSV   Review of Systems For ROS see HPI     Objective:   Physical Exam Vitals reviewed.  Constitutional:      General: She is not in acute distress.    Appearance: She is well-developed. She is not ill-appearing.  HENT:     Head: Normocephalic and atraumatic.     Right Ear: A middle ear effusion is present. Tympanic membrane is erythematous.     Left Ear: A middle ear effusion is present. Tympanic membrane is erythematous.     Mouth/Throat:     Mouth: Mucous membranes are moist. No oral lesions.     Pharynx: No oropharyngeal exudate or posterior oropharyngeal erythema.     Tonsils: No tonsillar exudate.  Cardiovascular:     Rate and Rhythm: Normal rate and regular rhythm.  Pulmonary:     Effort: Pulmonary effort is normal. No respiratory distress.     Breath sounds: No wheezing or rhonchi.  Abdominal:     General: There is no distension.     Palpations: Abdomen is soft.     Tenderness: There is no abdominal tenderness. There is no guarding.  Lymphadenopathy:     Cervical: Cervical adenopathy present.  Skin:    General: Skin is warm and dry.     Comments: + skin irritation in gluteal cleft but no vesicles or 'outbreak' noted  Neurological:     Mental Status: She is alert.  Psychiatric:     Comments: Tangential thought process, hard to follow at  times           Assessment & Plan:  Bilateral otitis media- new.  Pt's ears are both red and bulging.  Will start Amoxicillin 875mg  BID.  Discussed that this will cover any bacterial infection present- strep, sinuses, lungs- although throat is WNL and lungs are CTAB.  Reviewed supportive care and red flags that should prompt return.  Pt expressed understanding and is in agreement w/ plan.   Cough- thankfully respiratory panel done 2 days ago was negative.  Lungs CTAB.  Treat supportively w/ promethazine cough syrup.  Pt expressed understanding and is in agreement w/ plan.   STD exposure- new.  Pt reports she had new sex partner ~10 days ago.  Check labs.  Encouraged condom use at each encounter for protection.

## 2023-09-04 LAB — URINE CYTOLOGY ANCILLARY ONLY
Chlamydia: NEGATIVE
Comment: NEGATIVE
Comment: NORMAL
Neisseria Gonorrhea: NEGATIVE

## 2023-09-04 LAB — RPR: RPR Ser Ql: NONREACTIVE

## 2023-09-04 LAB — HIV ANTIBODY (ROUTINE TESTING W REFLEX): HIV 1&2 Ab, 4th Generation: NONREACTIVE

## 2023-09-07 ENCOUNTER — Telehealth: Payer: Self-pay

## 2023-09-07 NOTE — Telephone Encounter (Signed)
-----   Message from Neena Rhymes sent at 09/06/2023  9:40 AM EDT ----- All STD testing was negative.  Great news!

## 2023-09-10 ENCOUNTER — Encounter: Payer: Self-pay | Admitting: Family Medicine

## 2023-09-10 ENCOUNTER — Telehealth: Payer: Self-pay | Admitting: Family Medicine

## 2023-09-10 NOTE — Telephone Encounter (Signed)
Pt is calling and need new rx on generic valtrex. Pt does not know the mg. The new [facility] [address] ([address] [place] - [address] Phone: [phone]  Fax: [phone]

## 2023-09-11 MED ORDER — VALACYCLOVIR HCL 1 G PO TABS: 1000.0000 mg | ORAL_TABLET | Freq: Two times a day (BID) | ORAL | 5 refills | Status: DC

## 2023-09-18 ENCOUNTER — Ambulatory Visit: Payer: Medicaid Other | Admitting: Family Medicine

## 2023-09-23 ENCOUNTER — Encounter: Payer: Self-pay | Admitting: Family Medicine

## 2023-09-23 ENCOUNTER — Ambulatory Visit: Payer: Medicaid Other | Admitting: Family Medicine

## 2023-09-23 VITALS — BP 116/70 | HR 97 | Temp 98.7°F | Ht 62.25 in | Wt 234.4 lb

## 2023-09-23 DIAGNOSIS — Z6841 Body Mass Index (BMI) 40.0 and over, adult: Secondary | ICD-10-CM

## 2023-09-23 DIAGNOSIS — F902 Attention-deficit hyperactivity disorder, combined type: Secondary | ICD-10-CM

## 2023-09-23 DIAGNOSIS — E785 Hyperlipidemia, unspecified: Secondary | ICD-10-CM

## 2023-09-23 DIAGNOSIS — F332 Major depressive disorder, recurrent severe without psychotic features: Secondary | ICD-10-CM

## 2023-09-23 LAB — COMPREHENSIVE METABOLIC PANEL
ALT: 18 U/L (ref 0–35)
AST: 13 U/L (ref 0–37)
Albumin: 4.4 g/dL (ref 3.5–5.2)
Alkaline Phosphatase: 46 U/L (ref 39–117)
BUN: 11 mg/dL (ref 6–23)
CO2: 26 meq/L (ref 19–32)
Calcium: 9.8 mg/dL (ref 8.4–10.5)
Chloride: 104 meq/L (ref 96–112)
Creatinine, Ser: 0.62 mg/dL (ref 0.40–1.20)
GFR: 128.5 mL/min (ref >60.00)
Glucose, Bld: 116 mg/dL — ABNORMAL HIGH (ref 70–99)
Potassium: 3.8 meq/L (ref 3.5–5.1)
Sodium: 139 meq/L (ref 135–145)
Total Bilirubin: 0.5 mg/dL (ref 0.2–1.2)
Total Protein: 7.2 g/dL (ref 6.0–8.3)

## 2023-09-23 LAB — CBC WITH DIFFERENTIAL/PLATELET
Basophils Absolute: 0 10*3/uL (ref 0.0–0.1)
Basophils Relative: 0.3 % (ref 0.0–3.0)
Eosinophils Absolute: 0.1 10*3/uL (ref 0.0–0.7)
Eosinophils Relative: 1 % (ref 0.0–5.0)
HCT: 39.8 % (ref 36.0–46.0)
Hemoglobin: 13.1 g/dL (ref 12.0–15.0)
Lymphocytes Relative: 25.8 % (ref 12.0–46.0)
Lymphs Abs: 2 10*3/uL (ref 0.7–4.0)
MCHC: 33 g/dL (ref 30.0–36.0)
MCV: 84.4 fL (ref 78.0–100.0)
Monocytes Absolute: 0.8 10*3/uL (ref 0.1–1.0)
Monocytes Relative: 10.4 % (ref 3.0–12.0)
Neutro Abs: 4.8 10*3/uL (ref 1.4–7.7)
Neutrophils Relative %: 62.5 % (ref 43.0–77.0)
Platelets: 446 10*3/uL — ABNORMAL HIGH (ref 150.0–400.0)
RBC: 4.71 Mil/uL (ref 3.87–5.11)
RDW: 15.3 % — ABNORMAL HIGH (ref 11.5–14.6)
WBC: 7.7 10*3/uL (ref 4.5–10.5)

## 2023-09-23 LAB — HEMOGLOBIN A1C: Hgb A1c MFr Bld: 6 % (ref 4.6–6.5)

## 2023-09-23 LAB — TSH: TSH: 0.7 u[IU]/mL (ref 0.35–5.50)

## 2023-09-23 MED ORDER — GUANFACINE HCL 1 MG PO TABS
1.0000 mg | ORAL_TABLET | Freq: Every day | ORAL | 2 refills | Status: DC
Start: 2023-09-23 — End: 2023-11-05

## 2023-09-23 MED ORDER — METHYLPHENIDATE HCL ER (CD) 10 MG PO CPCR
10.0000 mg | ORAL_CAPSULE | ORAL | 0 refills | Status: DC
Start: 2023-09-23 — End: 2023-11-05

## 2023-09-23 MED ORDER — ARIPIPRAZOLE 5 MG PO TABS
5.0000 mg | ORAL_TABLET | Freq: Every day | ORAL | 1 refills | Status: DC
Start: 2023-09-23 — End: 2024-03-04

## 2023-09-23 NOTE — Progress Notes (Unsigned)
Established Patient Office Visit  Subjective   Patient ID: Courtney Holloway, female    DOB: [birth year]  Age: 22 y.o. MRN: [id number]  Chief Complaint  Patient presents with   Medical Management of Chronic Issues    Patient requests lab testing after giving birth, requests referral for breast reduction and wants to know if she should restart Abilify or if a referral is needed to psychologist    Pt is here for follow up today.   Depression-- she reports that she is in therapy now at [facility], right now she is in the beginning stages now so is not sure if it is going to be helpful because she is just starting.   Pt reports that her depression symptoms are worsening, states that she is failing 2 classes and the father of her child has left her- he was cheating on her and now they are going through legal proceedings for custody of her child. 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 She is reporting that her ADHD is not well controlled. She reports lack of motivation to study and difficulty finishing tasks. States that she did not do well with wellbutrin and the stimulants she has tried in the past caused nausea/ flushing. Patient has been on multiple medications in the past-- I have extensively reviewed her previous medications.     Current Outpatient Medications  Medication Instructions   ARIPiprazole (ABILIFY) 5 mg, Oral, Daily   etonogestrel (NEXPLANON) 68 MG IMPL implant 1 each, Subdermal,  Once   guanFACINE (TENEX) 1 mg, Oral, Daily at bedtime   ibuprofen (ADVIL) 600 mg, Oral, Every 8 hours PRN   methylphenidate (METADATE CD) 10 mg, Oral, BH-each morning   valACYclovir (VALTREX) 1,000 mg, Oral, 2 times daily, For 5-7 days as needed for outbreaks    Patient Active Problem List   Diagnosis Date Noted   Dysfunctional uterine bleeding 04/13/2023   Postpartum anemia 04/13/2023   Deficiency anemia  04/03/2023   Status post primary low transverse cesarean section 12/23/2022   BMI 50.0-59.9, adult (HCC) 12/04/2022   HSV (herpes simplex virus) anogenital infection 11/17/2022   Suicidal ideation 10/31/2022   GBS bacteriuria 10/06/2022   Elevated blood pressure reading without diagnosis of hypertension 01/13/2022   Body mass index (BMI) of 39.0 to 39.9 in adult 09/02/2021   Prediabetes 08/04/2021   Nightmares associated with chronic post-traumatic stress disorder 04/01/2021   Anal fissure 03/28/2021   Drug overdose, multiple drugs, undetermined intent, initial encounter 02/02/2021   Attention deficit hyperactivity disorder (ADHD), combined type 01/28/2021   MDD (major depressive disorder), recurrent severe, without psychosis (HCC) 01/16/2021   Dyslipidemia 02/15/2018   Hidradenitis axillaris 07/11/2016   Slow transit constipation 11/21/2014   Asthma 05/04/2013   GAD (generalized anxiety disorder) 11/02/2012   Post-traumatic stress disorder 11/02/2012      Review of Systems  All other systems reviewed and are negative.     Objective:     BP 116/70 (BP Location: Left Arm, Patient Position: Sitting, Cuff Size: Large)   Pulse 97   Temp 98.7 F (37.1 C) (Oral)   Ht 5' 2.25" (1.581 m)   Wt 234 lb 6.4 oz (106.3 kg)   LMP 09/12/2023 (Exact Date)   SpO2 96%   Breastfeeding No   BMI 42.53 kg/m    Physical Exam Constitutional:      Appearance: Normal appearance. She is morbidly obese.  Eyes:  Conjunctiva/sclera: Conjunctivae normal.  Pulmonary:     Effort: Pulmonary effort is normal.  Musculoskeletal:     Right lower leg: No edema.     Left lower leg: No edema.  Neurological:     Mental Status: She is alert and oriented to person, place, and time. Mental status is at baseline.  Psychiatric:        Mood and Affect: Mood is depressed. Affect is flat.        Speech: Speech normal.        Behavior: Behavior normal.        Cognition and Memory: Cognition normal.         The ASCVD Risk score (Arnett DK, et al., 2019) failed to calculate for the following reasons:   The 2019 ASCVD risk score is only valid for ages 25 to 22    Assessment & Plan:  Attention deficit hyperactivity disorder (ADHD), combined type Assessment & Plan: Pt is having increased irritability on the concerta, we will reduce her dose and switch her to methylphenidate ER at 10 mg daily. Also I recommended using guanfacine at night to help with her sleep patterns. RTC in 6 weeks for a follow up.   Orders: -     Methylphenidate HCl ER (CD); Take 1 capsule (10 mg total) by mouth every morning.  Dispense: 30 capsule; Refill: 0 -     guanFACINE HCl; Take 1 tablet (1 mg total) by mouth at bedtime.  Dispense: 30 tablet; Refill: 2  MDD (major depressive disorder), recurrent severe, without psychosis (HCC) Assessment & Plan: Pt is reporting worsening symptoms due to situations in her home life. I encouraged her to stay on the abilify since initially it seemed to be helping. I will check her annual labs today for surveillance, will see her back in 6 weeks to re-evaluate her symptoms.   Orders: -     TSH -     Comprehensive metabolic panel -     ARIPiprazole; Take 1 tablet (5 mg total) by mouth daily.  Dispense: 90 tablet; Refill: 1  Morbid obesity (HCC) -     CBC with Differential/Platelet -     Hemoglobin A1c     Return in about 6 weeks (around 11/04/2023) for video visit for depression follow up.    Karie Georges, MD

## 2023-09-25 ENCOUNTER — Ambulatory Visit: Payer: Medicaid Other | Admitting: Family Medicine

## 2023-09-30 NOTE — Assessment & Plan Note (Signed)
Pt is having increased irritability on the concerta, we will reduce her dose and switch her to methylphenidate ER at 10 mg daily. Also I recommended using guanfacine at night to help with her sleep patterns. RTC in 6 weeks for a follow up.

## 2023-09-30 NOTE — Assessment & Plan Note (Signed)
Pt is reporting worsening symptoms due to situations in her home life. I encouraged her to stay on the abilify since initially it seemed to be helping. I will check her annual labs today for surveillance, will see her back in 6 weeks to re-evaluate her symptoms.

## 2023-10-08 ENCOUNTER — Ambulatory Visit: Payer: Medicaid Other | Admitting: Family Medicine

## 2023-10-09 ENCOUNTER — Telehealth: Payer: Self-pay

## 2023-10-09 ENCOUNTER — Other Ambulatory Visit (HOSPITAL_COMMUNITY): Payer: Self-pay

## 2023-10-09 NOTE — Telephone Encounter (Signed)
Pharmacy Patient Advocate Encounter   Received notification from CoverMyMeds that prior authorization for ARIPiprazole  5 MG tablet is required/requested.   Insurance verification completed.   The patient is insured through [employer] [facility] MEDICAID .   Per test claim: APPROVED from 10-09-2023 to 10-08-2024  Confirm #: [id number]

## 2023-10-09 NOTE — Telephone Encounter (Signed)
Left a detailed message with the information below on the voicemail at [facility].

## 2023-10-11 ENCOUNTER — Telehealth: Payer: Medicaid Other

## 2023-10-11 ENCOUNTER — Telehealth: Payer: Medicaid Other | Admitting: Family

## 2023-10-11 DIAGNOSIS — H109 Unspecified conjunctivitis: Secondary | ICD-10-CM | POA: Diagnosis not present

## 2023-10-11 MED ORDER — POLYMYXIN B-TRIMETHOPRIM 10000-0.1 UNIT/ML-% OP SOLN: 1.0000 [drp] | Freq: Four times a day (QID) | OPHTHALMIC | 0 refills | Status: DC

## 2023-10-11 NOTE — Progress Notes (Signed)
Virtual Visit Consent   Courtney Holloway, you are scheduled for a virtual visit with a [facility] provider today. Just as with appointments in the office, your consent must be obtained to participate. Your consent will be active for this visit and any virtual visit you may have with one of our providers in the next 365 days. If you have a MyChart account, a copy of this consent can be sent to you electronically.  As this is a virtual visit, video technology does not allow for your provider to perform a traditional examination. This may limit your provider's ability to fully assess your condition. If your provider identifies any concerns that need to be evaluated in person or the need to arrange testing (such as labs, EKG, etc.), we will make arrangements to do so. Although advances in technology are sophisticated, we cannot ensure that it will always work on either your end or our end. If the connection with a video visit is poor, the visit may have to be switched to a telephone visit. With either a video or telephone visit, we are not always able to ensure that we have a secure connection.  By engaging in this virtual visit, you consent to the provision of healthcare and authorize for your insurance to be billed (if applicable) for the services provided during this visit. Depending on your insurance coverage, you may receive a charge related to this service.  I need to obtain your verbal consent now. Are you willing to proceed with your visit today? Courtney Holloway has provided verbal consent on 10/11/2023 for a virtual visit (video or telephone). Jannifer Rodney, FNP  Date: 10/11/2023 8:30 AM  Virtual Visit via Video Note   I, Jannifer Rodney, connected with  Courtney Holloway  ([id number], 03/04/2001) on 10/11/23 at  8:30 AM EDT by a video-enabled telemedicine application and verified that I am speaking with the correct person using two identifiers.  Location: Patient: Virtual  Visit Location Patient: Home Provider: Virtual Visit Location Provider: Home Office   I discussed the limitations of evaluation and management by telemedicine and the availability of in person appointments. The patient expressed understanding and agreed to proceed.    History of Present Illness: Courtney Holloway is a 22 y.o. who identifies as a female who was assigned female at birth, and is being seen today for conjunctivitis in her left eye two days ago. It has spread to her right eye. She been taking claritin and otc eye drops without relief. Reports this morning it was worsen and "matted together".   HPI: Conjunctivitis  The current episode started 2 days ago. The onset was sudden. The problem has been gradually worsening. The symptoms are relieved by rest. Associated symptoms include eye itching, photophobia, eye discharge, eye pain and eye redness.    Problems:  Patient Active Problem List   Diagnosis Date Noted   Dysfunctional uterine bleeding 04/13/2023   Postpartum anemia 04/13/2023   Deficiency anemia 04/03/2023   Status post primary low transverse cesarean section 12/23/2022   BMI 50.0-59.9, adult (HCC) 12/04/2022   HSV (herpes simplex virus) anogenital infection 11/17/2022   Suicidal ideation 10/31/2022   GBS bacteriuria 10/06/2022   Elevated blood pressure reading without diagnosis of hypertension 01/13/2022   Body mass index (BMI) of 39.0 to 39.9 in adult 09/02/2021   Prediabetes 08/04/2021   Nightmares associated with chronic post-traumatic stress disorder 04/01/2021   Anal fissure 03/28/2021   Drug overdose, multiple drugs, undetermined intent, initial encounter  02/02/2021   Attention deficit hyperactivity disorder (ADHD), combined type 01/28/2021   MDD (major depressive disorder), recurrent severe, without psychosis (HCC) 01/16/2021   Dyslipidemia 02/15/2018   Hidradenitis axillaris 07/11/2016   Slow transit constipation 11/21/2014   Asthma 05/04/2013   GAD  (generalized anxiety disorder) 11/02/2012   Post-traumatic stress disorder 11/02/2012    Allergies:  Allergies  Allergen Reactions   Other Anaphylaxis and Itching    Seasonal allergies, dog fur, certain detergents  Pt. Reports mother has used "epi-pen" for past allergies.    Pineapple Anaphylaxis   Fish-Derived Products Diarrhea, Hives and Itching   Zofran [Ondansetron Hcl] Itching   Medications:  Current Outpatient Medications:    trimethoprim-polymyxin b (POLYTRIM) ophthalmic solution, Place 1 drop into both eyes every 6 (six) hours., Disp: 10 mL, Rfl: 0   ARIPiprazole (ABILIFY) 5 MG tablet, Take 1 tablet (5 mg total) by mouth daily., Disp: 90 tablet, Rfl: 1   etonogestrel (NEXPLANON) 68 MG IMPL implant, 1 each by Subdermal route once., Disp: , Rfl:    guanFACINE (TENEX) 1 MG tablet, Take 1 tablet (1 mg total) by mouth at bedtime., Disp: 30 tablet, Rfl: 2   ibuprofen (ADVIL) 600 MG tablet, Take 1 tablet (600 mg total) by mouth every 8 (eight) hours as needed., Disp: 30 tablet, Rfl: 0   methylphenidate (METADATE CD) 10 MG CR capsule, Take 1 capsule (10 mg total) by mouth every morning., Disp: 30 capsule, Rfl: 0  Observations/Objective: Patient is well-developed, well-nourished in no acute distress.  Resting comfortably  at home.  Head is normocephalic, atraumatic.  No labored breathing.  Speech is clear and coherent with logical content.  Patient is alert and oriented at baseline.  Bilateral eye erythemas and crust present in eyelashes, left worse than right  Assessment and Plan: 1. Bacterial conjunctivitis - trimethoprim-polymyxin b (POLYTRIM) ophthalmic solution; Place 1 drop into both eyes every 6 (six) hours.  Dispense: 10 mL; Refill: 0  Keep clean and dry Avoid rubbing  Good hand hygiene  Warm compresses as needed  Follow up if symptoms worsen or do not improve   Follow Up Instructions: I discussed the assessment and treatment plan with the patient. The patient was  provided an opportunity to ask questions and all were answered. The patient agreed with the plan and demonstrated an understanding of the instructions.  A copy of instructions were sent to the patient via MyChart unless otherwise noted below.    The patient was advised to call back or seek an in-person evaluation if the symptoms worsen or if the condition fails to improve as anticipated.    Jannifer Rodney, FNP

## 2023-10-13 ENCOUNTER — Ambulatory Visit: Payer: Medicaid Other | Admitting: Family Medicine

## 2023-10-19 ENCOUNTER — Encounter: Payer: Self-pay | Admitting: Emergency Medicine

## 2023-10-19 ENCOUNTER — Ambulatory Visit
Admission: EM | Admit: 2023-10-19 | Discharge: 2023-10-19 | Disposition: A | Discharge status: Home / Self Care | Payer: Medicaid Other | Attending: Family Medicine | Admitting: Family Medicine

## 2023-10-19 DIAGNOSIS — R5383 Other fatigue: Secondary | ICD-10-CM | POA: Diagnosis present

## 2023-10-19 DIAGNOSIS — D649 Anemia, unspecified: Secondary | ICD-10-CM

## 2023-10-19 DIAGNOSIS — R42 Dizziness and giddiness: Secondary | ICD-10-CM

## 2023-10-19 DIAGNOSIS — R519 Headache, unspecified: Secondary | ICD-10-CM | POA: Diagnosis not present

## 2023-10-19 DIAGNOSIS — N898 Other specified noninflammatory disorders of vagina: Secondary | ICD-10-CM | POA: Diagnosis not present

## 2023-10-19 LAB — POCT FASTING CBG KUC MANUAL ENTRY: POCT Glucose (KUC): 128 mg/dL — AB (ref 70–99)

## 2023-10-19 MED ORDER — FLUCONAZOLE 150 MG PO TABS: ORAL_TABLET | ORAL | 0 refills | Status: DC

## 2023-10-19 NOTE — ED Provider Notes (Addendum)
[facility]    CSN: [id number] Arrival date & time: 10/19/23  1449      History   Chief Complaint Chief Complaint  Patient presents with   Vaginal Discharge    HPI Courtney Holloway is a 22 y.o. female.    Vaginal Discharge  Patient is here for several issues.  She has vaginal discharge, is chunky and white.  It does have an odor at this time.  Some itching as well.  She does have a new partner, and wonders if is an STD.   She is having light headedness and headache x 3 days.  Headache is at the front of the head.  It is annoying.  She has been fatigued, and over exhausted.  Eating/drinking does not affect/help this.   She has taken tylenol without much help.  She was getting iron infusions, but it has been a while since she got an infusion.  Some nausea.  She is feeling "weird".  She is urinating a lot, some abd cramping. Increased urination.  She has nexplanon.  No diarrhea or constipation.  No painful urination.  She is pre-diabetic.        Past Medical History:  Diagnosis Date   Anxiety    Asthma    Depression    Diabetes mellitus during pregnancy in second trimester 09/15/2022   DMDD (disruptive mood dysregulation disorder) (HCC) 12/28/2015   Failed vision screen 11/21/2014   GAD (generalized anxiety disorder)    Gestational diabetes    Headache(784.0)    HSV-1 (herpes simplex virus 1) infection 08/02/2020   Obesity 11/21/2014   Vision abnormalities    wears glasses    Patient Active Problem List   Diagnosis Date Noted   Dysfunctional uterine bleeding 04/13/2023   Postpartum anemia 04/13/2023   Deficiency anemia 04/03/2023   Status post primary low transverse cesarean section 12/23/2022   BMI 50.0-59.9, adult (HCC) 12/04/2022   HSV (herpes simplex virus) anogenital infection 11/17/2022   Suicidal ideation 10/31/2022   GBS bacteriuria 10/06/2022   Elevated blood pressure reading without diagnosis of hypertension 01/13/2022    Body mass index (BMI) of 39.0 to 39.9 in adult 09/02/2021   Prediabetes 08/04/2021   Nightmares associated with chronic post-traumatic stress disorder 04/01/2021   Anal fissure 03/28/2021   Drug overdose, multiple drugs, undetermined intent, initial encounter 02/02/2021   Attention deficit hyperactivity disorder (ADHD), combined type 01/28/2021   MDD (major depressive disorder), recurrent severe, without psychosis (HCC) 01/16/2021   Dyslipidemia 02/15/2018   Hidradenitis axillaris 07/11/2016   Slow transit constipation 11/21/2014   Asthma 05/04/2013   GAD (generalized anxiety disorder) 11/02/2012   Post-traumatic stress disorder 11/02/2012    Past Surgical History:  Procedure Laterality Date   CESAREAN SECTION N/A 12/23/2022   Procedure: CESAREAN SECTION;  Surgeon: Levie Heritage, DO;  Location: [facility] ORS;  Service: Obstetrics;  Laterality: N/A;   NO PAST SURGERIES      OB History     Gravida  1   Para  1   Term  1   Preterm      AB      Living  1      SAB      IAB      Ectopic      Multiple  0   Live Births  1            Home Medications    Prior to Admission medications   Medication Sig Start Date End  Date Taking? Authorizing Provider  ARIPiprazole (ABILIFY) 5 MG tablet Take 1 tablet (5 mg total) by mouth daily. 09/23/23   Karie Georges, MD  etonogestrel (NEXPLANON) 68 MG IMPL implant 1 each by Subdermal route once.    [provider]  guanFACINE (TENEX) 1 MG tablet Take 1 tablet (1 mg total) by mouth at bedtime. 09/23/23   Karie Georges, MD  ibuprofen (ADVIL) 600 MG tablet Take 1 tablet (600 mg total) by mouth every 8 (eight) hours as needed. 09/03/23   Sheliah Hatch, MD  methylphenidate (METADATE CD) 10 MG CR capsule Take 1 capsule (10 mg total) by mouth every morning. 09/23/23   Karie Georges, MD  trimethoprim-polymyxin b Joaquim Lai) ophthalmic solution Place 1 drop into both eyes every 6 (six) hours. 10/11/23   Junie Spencer, FNP    Family History Family History  Problem Relation Age of Onset   Diabetes Mother    Bipolar disorder Mother    Schizophrenia Mother    Multiple sclerosis Mother    Diabetes Father    Bipolar disorder Maternal Grandmother    Alcohol abuse Maternal Grandmother     Social History Social History   Tobacco Use   Smoking status: Former    Types: E-cigarettes    Quit date: 04/13/2022    Years since quitting: 1.5   Smokeless tobacco: Never  Vaping Use   Vaping status: Every Day  Substance Use Topics   Alcohol use: Not Currently   Drug use: Not Currently    Types: Marijuana    Comment: 2021     Allergies   Other, Pineapple, Fish-derived products, and Zofran [ondansetron hcl]   Review of Systems Review of Systems  Constitutional:  Positive for fatigue.  HENT: Negative.    Respiratory: Negative.    Cardiovascular: Negative.   Gastrointestinal: Negative.   Endocrine: Positive for polydipsia.  Genitourinary:  Positive for frequency and vaginal discharge.  Neurological:  Positive for light-headedness and headaches.  Psychiatric/Behavioral: Negative.       Physical Exam Triage Vital Signs ED Triage Vitals  Encounter Vitals Group     BP 10/19/23 1501 113/75     Systolic BP Percentile --      Diastolic BP Percentile --      Pulse Rate 10/19/23 1501 89     Resp 10/19/23 1501 16     Temp 10/19/23 1501 98.6 F (37 C)     Temp Source 10/19/23 1501 Oral     SpO2 10/19/23 1501 95 %     Weight --      Height --      Head Circumference --      Peak Flow --      Pain Score 10/19/23 1504 0     Pain Loc --      Pain Education --      Exclude from Growth Chart --    No data found.  Updated Vital Signs BP 113/75 (BP Location: Right Arm)   Pulse 89   Temp 98.6 F (37 C) (Oral)   Resp 16   LMP 09/12/2023 (Exact Date)   SpO2 95%   Visual Acuity Right Eye Distance:   Left Eye Distance:   Bilateral Distance:    Right Eye Near:   Left Eye Near:     Bilateral Near:     Physical Exam Constitutional:      Appearance: Normal appearance.  Eyes:     Extraocular Movements: Extraocular movements intact.  Pupils: Pupils are equal, round, and reactive to light.  Cardiovascular:     Rate and Rhythm: Normal rate and regular rhythm.  Pulmonary:     Effort: Pulmonary effort is normal.     Breath sounds: Normal breath sounds.  Abdominal:     Palpations: Abdomen is soft.     Tenderness: There is no abdominal tenderness. There is no guarding or rebound.  Musculoskeletal:     Cervical back: Normal range of motion and neck supple.  Skin:    General: Skin is warm.  Neurological:     General: No focal deficit present.     Mental Status: She is alert and oriented to person, place, and time.     Cranial Nerves: No cranial nerve deficit.     Sensory: No sensory deficit.     Motor: No weakness.     Coordination: Coordination normal.  Psychiatric:        Mood and Affect: Mood normal.      UC Treatments / Results  Labs (all labs ordered are listed, but only abnormal results are displayed) Labs Reviewed  POCT FASTING CBG [facility] MANUAL ENTRY - Abnormal; Notable for the following components:      Result Value   POCT Glucose ([facility]) 128 (*)    All other components within normal limits  CBC  FERRITIN  HIV ANTIBODY (ROUTINE TESTING W REFLEX)  RPR  CERVICOVAGINAL ANCILLARY ONLY    EKG   Radiology No results found.  Procedures Procedures (including critical care time)  Medications Ordered in UC Medications - No data to display  Initial Impression / Assessment and Plan / UC Course  I have reviewed the triage vital signs and the nursing notes.  Pertinent labs & imaging results that were available during my care of the patient were reviewed by me and considered in my medical decision making (see chart for details).   Final Clinical Impressions(s) / UC Diagnoses   Final diagnoses:  Vaginal discharge  Nonintractable headache,  unspecified chronicity pattern, unspecified headache type  Light-headed feeling  Anemia, unspecified type  Other fatigue     Discharge Instructions      You were seen today for various symptoms.  Your vaginal swab will be resulted tomorrow.  In the mean time I have sent out diflucan for possible yeast infection.  If there is anything else that is positive on the swab you will be notified.  Your blood sugar is normal today.  I have ordered blood work in terms of the headache, fatigue, and light headedness.  The labs will be resulted tomorrow and visible in mychart.  If there is anything abnormal you will be notified.  In the mean time I recommend you use tylenol for pain, and get plenty of rest and fluids.  If this is not improving or worsening then please return or follow up with your primary care provider.     ED Prescriptions     Medication Sig Dispense Auth. Provider   fluconazole (DIFLUCAN) 150 MG tablet 1 tab po x 1 dose, repeat in 3 days 2 tablet Jannifer Franklin, MD      PDMP not reviewed this encounter.   Jannifer Franklin, MD 10/19/23 2956    Jannifer Franklin, MD 10/19/23 1547

## 2023-10-19 NOTE — ED Triage Notes (Signed)
Reports new vaginal itching and discharge starting Friday. Reports new intimate partner recently. Requesting STD check with blood work.  Patient also concerned d/t new recent headache and fatigue. States she's concerned d/t her stopping her iron infusions.

## 2023-10-19 NOTE — Discharge Instructions (Addendum)
You were seen today for various symptoms.  Your vaginal swab will be resulted tomorrow.  In the mean time I have sent out diflucan for possible yeast infection.  If there is anything else that is positive on the swab you will be notified.  Your blood sugar is normal today.  I have ordered blood work in terms of the headache, fatigue, and light headedness.  The labs will be resulted tomorrow and visible in mychart.  If there is anything abnormal you will be notified.  In the mean time I recommend you use tylenol for pain, and get plenty of rest and fluids.  If this is not improving or worsening then please return or follow up with your primary care provider.

## 2023-10-20 LAB — CBC
Hematocrit: 39.4 % (ref 34.0–46.6)
Hemoglobin: 12.9 g/dL (ref 11.1–15.9)
MCH: 27.7 pg (ref 26.6–33.0)
MCHC: 32.7 g/dL (ref 31.5–35.7)
MCV: 85 fL (ref 79–97)
Platelets: 393 10*3/uL (ref 150–450)
RBC: 4.66 x10E6/uL (ref 3.77–5.28)
RDW: 13.8 % (ref 11.7–15.4)
WBC: 11 10*3/uL — ABNORMAL HIGH (ref 3.4–10.8)

## 2023-10-20 LAB — CERVICOVAGINAL ANCILLARY ONLY
Bacterial Vaginitis (gardnerella): NEGATIVE
Candida Glabrata: NEGATIVE
Candida Vaginitis: POSITIVE — AB
Chlamydia: NEGATIVE
Comment: NEGATIVE
Comment: NEGATIVE
Comment: NEGATIVE
Comment: NEGATIVE
Comment: NEGATIVE
Comment: NORMAL
Neisseria Gonorrhea: NEGATIVE
Trichomonas: NEGATIVE

## 2023-10-20 LAB — HIV ANTIBODY (ROUTINE TESTING W REFLEX): HIV Screen 4th Generation wRfx: NONREACTIVE

## 2023-10-20 LAB — FERRITIN: Ferritin: 380 ng/mL — ABNORMAL HIGH (ref 15–150)

## 2023-10-20 LAB — RPR: RPR Ser Ql: NONREACTIVE

## 2023-10-22 ENCOUNTER — Ambulatory Visit: Payer: Medicaid Other | Admitting: Family Medicine

## 2023-11-05 ENCOUNTER — Telehealth: Payer: Medicaid Other | Admitting: Family Medicine

## 2023-11-05 DIAGNOSIS — F902 Attention-deficit hyperactivity disorder, combined type: Secondary | ICD-10-CM | POA: Diagnosis not present

## 2023-11-05 MED ORDER — METHYLPHENIDATE HCL ER (CD) 10 MG PO CPCR
10.0000 mg | ORAL_CAPSULE | ORAL | 0 refills | Status: DC
Start: 2023-12-30 — End: 2024-03-04

## 2023-11-05 MED ORDER — METHYLPHENIDATE HCL ER (CD) 10 MG PO CPCR
10.0000 mg | ORAL_CAPSULE | ORAL | 0 refills | Status: DC
Start: 2023-11-05 — End: 2024-03-04

## 2023-11-05 MED ORDER — GUANFACINE HCL 1 MG PO TABS
1.0000 mg | ORAL_TABLET | Freq: Every day | ORAL | 2 refills | Status: DC
Start: 2023-11-05 — End: 2024-03-04

## 2023-11-05 MED ORDER — METHYLPHENIDATE HCL ER (CD) 10 MG PO CPCR
10.0000 mg | ORAL_CAPSULE | ORAL | 0 refills | Status: DC
Start: 2023-12-03 — End: 2024-03-04

## 2023-11-05 NOTE — Progress Notes (Addendum)
Virtual Medical Office Visit  Patient:  Courtney Holloway      Age: 22 y.o.       Sex:  female  Date:   11/05/2023  PCP:    Karie Georges, MD   Today's Healthcare Provider: Karie Georges, MD    Assessment/Plan:   Summary assessment:  Courtney Holloway was seen today for medical management of chronic issues.  Attention deficit hyperactivity disorder (ADHD), combined type Assessment & Plan: Seems to be doing better on the metadate CD 10 mg daily, will continue this and the guanfacine at nighttime. RTC in 3 months for medication refills.   Orders: -     Methylphenidate HCl ER (CD); Take 1 capsule (10 mg total) by mouth every morning.  Dispense: 30 capsule; Refill: 0 -     guanFACINE HCl; Take 1 tablet (1 mg total) by mouth at bedtime.  Dispense: 30 tablet; Refill: 2 -     Methylphenidate HCl ER (CD); Take 1 capsule (10 mg total) by mouth every morning.  Dispense: 30 capsule; Refill: 0 -     Methylphenidate HCl ER (CD); Take 1 capsule (10 mg total) by mouth every morning.  Dispense: 30 capsule; Refill: 0     No follow-ups on file.   She was advised to call the office or go to ER if her condition worsens    Subjective:   Courtney Holloway is a 22 y.o. female with PMH significant for: Past Medical History:  Diagnosis Date   Anxiety    Asthma    Depression    Diabetes mellitus during pregnancy in second trimester 09/15/2022   DMDD (disruptive mood dysregulation disorder) (HCC) 12/28/2015   Failed vision screen 11/21/2014   GAD (generalized anxiety disorder)    Gestational diabetes    Headache(784.0)    HSV-1 (herpes simplex virus 1) infection 08/02/2020   Obesity 11/21/2014   Vision abnormalities    wears glasses     Presenting today with: Chief Complaint  Patient presents with   Medical Management of Chronic Issues     She clarifies and reports that her condition: Pt reports she doesn't feel much different than the last visit, states that she is  taking her abilify daily, reports she is out of her ADHD medication. States that the 10 mg is helping some with her attention, no irritability.  Pt reports that she is in therapy now and it seems to be helping as well.    She denies having any: Side effects to the medication          Objective/Observations  Physical Exam:  Polite and friendly, alert and oriented x 3, NAD Gen: NAD, resting comfortably Pulm: no coughing, pulmonary effort sounds normal Psych: Normal affect and thought content Problem specific physical exam findings:    No images are attached to the encounter or orders placed in the encounter.    Results: No results found for any visits on 11/05/23.     Virtual Visit via Video   I connected with Courtney Holloway on 11/05/23 at  1:30 PM EST by an audio enabled telemedicine application and verified that I am speaking with the correct person using two identifiers. The limitations of evaluation and management by telemedicine and the availability of in person appointments were discussed. The patient expressed understanding and agreed to proceed.   Percentage of appointment time on audio:  100% Patient location: Home Provider location: [place] [facility] Persons participating in the audio enabled visit: Myself  and Patient

## 2023-11-05 NOTE — Addendum Note (Signed)
Addended by: Karie Georges on: 11/05/2023 03:05 PM   Modules accepted: Orders, Level of Service

## 2023-11-05 NOTE — Progress Notes (Signed)
Patient unable to perform vital signs at home for virtual visit.

## 2023-11-05 NOTE — Assessment & Plan Note (Signed)
Seems to be doing better on the metadate CD 10 mg daily, will continue this and the guanfacine at nighttime. RTC in 3 months for medication refills.

## 2023-11-09 ENCOUNTER — Telehealth: Payer: Medicaid Other

## 2023-12-24 ENCOUNTER — Ambulatory Visit: Payer: Medicaid Other | Admitting: Allergy

## 2023-12-30 ENCOUNTER — Ambulatory Visit: Payer: Medicaid Other | Admitting: Family Medicine

## 2024-01-05 ENCOUNTER — Encounter: Payer: Self-pay | Admitting: Oncology

## 2024-01-11 ENCOUNTER — Encounter: Payer: Self-pay | Admitting: Oncology

## 2024-01-12 ENCOUNTER — Ambulatory Visit: Payer: Medicaid Other | Admitting: Family Medicine

## 2024-01-13 ENCOUNTER — Encounter (HOSPITAL_BASED_OUTPATIENT_CLINIC_OR_DEPARTMENT_OTHER): Payer: Self-pay | Admitting: Emergency Medicine

## 2024-01-13 ENCOUNTER — Emergency Department (HOSPITAL_BASED_OUTPATIENT_CLINIC_OR_DEPARTMENT_OTHER)
Admission: EM | Admit: 2024-01-13 | Discharge: 2024-01-13 | Disposition: A | Discharge status: Home / Self Care | Payer: Medicaid Other | Attending: Emergency Medicine | Admitting: Emergency Medicine

## 2024-01-13 ENCOUNTER — Encounter: Payer: Self-pay | Admitting: Oncology

## 2024-01-13 ENCOUNTER — Emergency Department (HOSPITAL_BASED_OUTPATIENT_CLINIC_OR_DEPARTMENT_OTHER): Payer: Medicaid Other | Admitting: Radiology

## 2024-01-13 ENCOUNTER — Other Ambulatory Visit: Payer: Self-pay

## 2024-01-13 DIAGNOSIS — J209 Acute bronchitis, unspecified: Secondary | ICD-10-CM | POA: Insufficient documentation

## 2024-01-13 DIAGNOSIS — E669 Obesity, unspecified: Secondary | ICD-10-CM | POA: Insufficient documentation

## 2024-01-13 DIAGNOSIS — J069 Acute upper respiratory infection, unspecified: Secondary | ICD-10-CM | POA: Insufficient documentation

## 2024-01-13 DIAGNOSIS — Z20822 Contact with and (suspected) exposure to covid-19: Secondary | ICD-10-CM | POA: Insufficient documentation

## 2024-01-13 DIAGNOSIS — Z6841 Body Mass Index (BMI) 40.0 and over, adult: Secondary | ICD-10-CM | POA: Diagnosis not present

## 2024-01-13 DIAGNOSIS — R059 Cough, unspecified: Secondary | ICD-10-CM | POA: Diagnosis present

## 2024-01-13 LAB — RESP PANEL BY RT-PCR (RSV, FLU A&B, COVID)  RVPGX2
Influenza A by PCR: NEGATIVE
Influenza B by PCR: NEGATIVE
Resp Syncytial Virus by PCR: NEGATIVE
SARS Coronavirus 2 by RT PCR: NEGATIVE

## 2024-01-13 LAB — COMPREHENSIVE METABOLIC PANEL
ALT: 15 U/L (ref 0–44)
AST: 13 U/L — ABNORMAL LOW (ref 15–41)
Albumin: 4.3 g/dL (ref 3.5–5.0)
Alkaline Phosphatase: 32 U/L — ABNORMAL LOW (ref 38–126)
Anion gap: 7 (ref 5–15)
BUN: 9 mg/dL (ref 6–20)
CO2: 25 mmol/L (ref 22–32)
Calcium: 8.5 mg/dL — ABNORMAL LOW (ref 8.9–10.3)
Chloride: 104 mmol/L (ref 98–111)
Creatinine, Ser: 0.45 mg/dL (ref 0.44–1.00)
GFR, Estimated: >60 mL/min (ref >60)
Glucose, Bld: 88 mg/dL (ref 70–99)
Potassium: 3.9 mmol/L (ref 3.5–5.1)
Sodium: 136 mmol/L (ref 135–145)
Total Bilirubin: 0.5 mg/dL (ref 0.0–1.2)
Total Protein: 7.2 g/dL (ref 6.5–8.1)

## 2024-01-13 LAB — D-DIMER, QUANTITATIVE: D-Dimer, Quant: <0.27 ug{FEU}/mL (ref 0.00–0.50)

## 2024-01-13 LAB — CBC WITH DIFFERENTIAL/PLATELET
Abs Immature Granulocytes: 0.01 10*3/uL (ref 0.00–0.07)
Basophils Absolute: 0 10*3/uL (ref 0.0–0.1)
Basophils Relative: 0 %
Eosinophils Absolute: 0.3 10*3/uL (ref 0.0–0.5)
Eosinophils Relative: 5 %
HCT: 40.9 % (ref 36.0–46.0)
Hemoglobin: 13.3 g/dL (ref 12.0–15.0)
Immature Granulocytes: 0 %
Lymphocytes Relative: 28 %
Lymphs Abs: 1.4 10*3/uL (ref 0.7–4.0)
MCH: 27.5 pg (ref 26.0–34.0)
MCHC: 32.5 g/dL (ref 30.0–36.0)
MCV: 84.7 fL (ref 80.0–100.0)
Monocytes Absolute: 0.8 10*3/uL (ref 0.1–1.0)
Monocytes Relative: 15 %
Neutro Abs: 2.7 10*3/uL (ref 1.7–7.7)
Neutrophils Relative %: 52 %
Platelets: 351 10*3/uL (ref 150–400)
RBC: 4.83 MIL/uL (ref 3.87–5.11)
RDW: 14.1 % (ref 11.5–15.5)
WBC: 5.2 10*3/uL (ref 4.0–10.5)
nRBC: 0 % (ref 0.0–0.2)

## 2024-01-13 LAB — HCG, SERUM, QUALITATIVE: Preg, Serum: NEGATIVE

## 2024-01-13 LAB — TROPONIN I (HIGH SENSITIVITY): Troponin I (High Sensitivity): <2 ng/L (ref <18)

## 2024-01-13 MED ORDER — PREDNISONE 10 MG PO TABS: 40.0000 mg | ORAL_TABLET | Freq: Every day | ORAL | 0 refills | Status: AC

## 2024-01-13 MED ORDER — BENZONATATE 100 MG PO CAPS: 100.0000 mg | ORAL_CAPSULE | Freq: Three times a day (TID) | ORAL | 0 refills | Status: DC

## 2024-01-13 MED ORDER — ALBUTEROL SULFATE HFA 108 (90 BASE) MCG/ACT IN AERS: 1.0000 | INHALATION_SPRAY | Freq: Four times a day (QID) | RESPIRATORY_TRACT | 0 refills | Status: DC | PRN

## 2024-01-13 NOTE — ED Notes (Signed)
Discharge paperwork given and verbally understood.

## 2024-01-13 NOTE — Discharge Instructions (Addendum)
Your COVID, influenza, RSV PCR testing was also negative today.  Your chest x-ray showed no evidence of a pneumonia.  You likely have a different viral upper respiratory infection.  Given your mild hemoptysis, will treat for acute bronchitis with cough suppressant, albuterol inhaler and steroids.  Follow-up with your primary care provider to ensure resolution, return for any severe worsening symptoms.  Your D-dimer was negative pointing away from blood clot as the cause of your symptoms.

## 2024-01-13 NOTE — ED Triage Notes (Signed)
Pt caox4, ambulatory, NAD c/o productive cough, congestion, bodyaches, chills. Pt was seen at [facility] [address] 1/27 for same, states she was swabbed and neg. Pt also c/o vaginal irration which she was also evaluated for and dx with yeast infection. Pt states she took Diflucan with no improvement.

## 2024-01-13 NOTE — ED Provider Notes (Signed)
[place] EMERGENCY DEPARTMENT AT [facility] Provider Note   CSN: [id number] Arrival date & time: 01/13/24  4098     History  Chief Complaint  Patient presents with   Cough    Courtney Holloway is a 23 y.o. female.  HPI   23 year old female presenting to the emergency department with a chief complaint of flulike symptoms.  She endorses a productive cough, nasal congestion, body aches, chills.  She was seen at urgent care 2 days ago.  She was also checked for sexually transmitted infections due to some vaginal irritation and was diagnosed with yeast vaginitis for which she has been taking fluconazole.  She has been having pleuritic chest pain.  She states that she has had episodes of hemoptysis as well.  No known sick contacts.  The vaginal burning and itching had lasted for 5 days and is now ongoing for 7 days and started after an episode of intercourse, not improved after initial diflucan. She has a hx of HSV-1 but has not had an outbreak since her initial diagnosis. She declines repeat pelvic exam today.  Home Medications Prior to Admission medications   Medication Sig Start Date End Date Taking? Authorizing Provider  albuterol (VENTOLIN HFA) 108 (90 Base) MCG/ACT inhaler Inhale 1-2 puffs into the lungs every 6 (six) hours as needed for wheezing or shortness of breath. 01/13/24  Yes Ernie Avena, MD  ARIPiprazole (ABILIFY) 5 MG tablet Take 1 tablet (5 mg total) by mouth daily. 09/23/23   Karie Georges, MD  benzonatate (TESSALON) 100 MG capsule Take 1 capsule (100 mg total) by mouth every 8 (eight) hours. 01/13/24  Yes Ernie Avena, MD  etonogestrel (NEXPLANON) 68 MG IMPL implant 1 each by Subdermal route once.    [provider]  guanFACINE (TENEX) 1 MG tablet Take 1 tablet (1 mg total) by mouth at bedtime. 11/05/23   Karie Georges, MD  ibuprofen (ADVIL) 600 MG tablet Take 1 tablet (600 mg total) by mouth every 8 (eight) hours as needed. 09/03/23    Sheliah Hatch, MD  methylphenidate (METADATE CD) 10 MG CR capsule Take 1 capsule (10 mg total) by mouth every morning. 11/05/23   Karie Georges, MD  methylphenidate (METADATE CD) 10 MG CR capsule Take 1 capsule (10 mg total) by mouth every morning. 12/03/23   Karie Georges, MD  methylphenidate (METADATE CD) 10 MG CR capsule Take 1 capsule (10 mg total) by mouth every morning. 12/30/23   Karie Georges, MD  predniSONE (DELTASONE) 10 MG tablet Take 4 tablets (40 mg total) by mouth daily for 5 days. 01/13/24 01/18/24 Yes Ernie Avena, MD      Allergies    Other, Pineapple, Fish-derived products, and Zofran [ondansetron hcl]    Review of Systems   Review of Systems  Physical Exam Updated Vital Signs BP 117/76 (BP Location: Right Arm)   Pulse 95   Temp 98 F (36.7 C) (Oral)   Resp 20   Ht 5\' 1"  (1.549 m)   Wt 104.8 kg   SpO2 98%   BMI 43.65 kg/m  Physical Exam Vitals and nursing note reviewed.  Constitutional:      General: She is not in acute distress.    Appearance: She is well-developed. She is obese.  HENT:     Head: Normocephalic and atraumatic.  Eyes:     Conjunctiva/sclera: Conjunctivae normal.  Cardiovascular:     Rate and Rhythm: Normal rate and regular rhythm.  Pulmonary:  Effort: Pulmonary effort is normal. No respiratory distress.     Breath sounds: Normal breath sounds.  Abdominal:     Palpations: Abdomen is soft.     Tenderness: There is no abdominal tenderness.  Genitourinary:    Comments: Pt declined exam Musculoskeletal:        General: No swelling.     Cervical back: Neck supple.  Skin:    General: Skin is warm and dry.     Capillary Refill: Capillary refill takes less than 2 seconds.  Neurological:     Mental Status: She is alert.  Psychiatric:        Mood and Affect: Mood normal.     ED Results / Procedures / Treatments   Labs (all labs ordered are listed, but only abnormal results are displayed) Labs Reviewed   COMPREHENSIVE METABOLIC PANEL - Abnormal; Notable for the following components:      Result Value   Calcium 8.5 (*)    AST 13 (*)    Alkaline Phosphatase 32 (*)    All other components within normal limits  RESP PANEL BY RT-PCR (RSV, FLU A&B, COVID)  RVPGX2  HCG, SERUM, QUALITATIVE  CBC WITH DIFFERENTIAL/PLATELET  D-DIMER, QUANTITATIVE  TROPONIN I (HIGH SENSITIVITY)    EKG EKG Interpretation Date/Time:  Wednesday January 13 2024 09:29:21 EST Ventricular Rate:  79 PR Interval:  165 QRS Duration:  90 QT Interval:  390 QTC Calculation: 448 R Axis:   67  Text Interpretation: Sinus rhythm Confirmed by Ernie Avena ([id number]) on 01/13/2024 9:37:06 AM  Radiology DG Chest 2 View Result Date: 01/13/2024 CLINICAL DATA:  Cough, hemoptysis EXAM: CHEST - 2 VIEW COMPARISON:  11/24/2021 FINDINGS: The heart size and mediastinal contours are within normal limits. Both lungs are clear. The visualized skeletal structures are unremarkable. IMPRESSION: No active cardiopulmonary disease. Electronically Signed   By: Judie Petit.  Shick M.D.   On: 01/13/2024 09:45    Procedures Procedures    Medications Ordered in ED Medications - No data to display  ED Course/ Medical Decision Making/ A&P                                 Medical Decision Making Amount and/or Complexity of Data Reviewed Labs: ordered. Radiology: ordered.  Risk Prescription drug management.    23 year old female presenting to the emergency department with a chief complaint of flulike symptoms.  She endorses a productive cough, nasal congestion, body aches, chills.  She was seen at urgent care 2 days ago.  She was also checked for sexually transmitted infections due to some vaginal irritation and was diagnosed with yeast vaginitis for which she has been taking fluconazole.  She has been having pleuritic chest pain.  She states that she has had episodes of hemoptysis as well.  No known sick contacts.  The vaginal burning and itching had  lasted for 5 days and is now ongoing for 7 days and started after an episode of intercourse, not improved after initial diflucan. She has a hx of HSV-1 but has not had an outbreak since her initial diagnosis. She declines repeat pelvic exam today.  On arrival, the patient was afebrile, not tachycardic or tachypneic, heart rate 95 on cardiac telemetry, BP 117/76, saturating 98% on room air.  Patient presenting with symptoms of a viral upper respiratory infection.  Also presenting with mild hemoptysis indicating mild bronchitis.  Additionally considered PE given the hemoptysis as the patient is  PERC positive, Wells low risk.  EKG was performed revealed sinus rhythm, ventricular rate 79, no abnormal intervals or acute ischemic changes.  A chest x-ray was performed which revealed no evidence of pneumothorax or pneumonia, no active disease.  Laboratory evaluation revealed COVID-19, influenza, RSV PCR testing negative, D-dimer negative, troponin less than 2, CMP unremarkable without significant electrolyte abnormality, normal renal function, hCG negative and CBC without a leukocytosis or anemia.  The patient was overall well-appearing, vitally stable.  Will treat with a course of steroids, cough suppressant due to likely viral bronchitis.  Patient provided with return precautions, advised to follow-up outpatient as needed.   Final Clinical Impression(s) / ED Diagnoses Final diagnoses:  Viral URI with cough  Acute bronchitis, unspecified organism    Rx / DC Orders ED Discharge Orders          Ordered    predniSONE (DELTASONE) 10 MG tablet  Daily        01/13/24 0950    benzonatate (TESSALON) 100 MG capsule  Every 8 hours        01/13/24 0950    albuterol (VENTOLIN HFA) 108 (90 Base) MCG/ACT inhaler  Every 6 hours PRN        01/13/24 0950              Ernie Avena, MD 01/13/24 1025

## 2024-01-19 ENCOUNTER — Encounter: Payer: Self-pay | Admitting: Oncology

## 2024-01-19 ENCOUNTER — Telehealth: Payer: Medicaid Other | Admitting: Physician Assistant

## 2024-01-19 DIAGNOSIS — H6691 Otitis media, unspecified, right ear: Secondary | ICD-10-CM | POA: Diagnosis not present

## 2024-01-19 MED ORDER — AMOXICILLIN 875 MG PO TABS: 875.0000 mg | ORAL_TABLET | Freq: Two times a day (BID) | ORAL | 0 refills | Status: DC

## 2024-01-19 NOTE — Progress Notes (Signed)
 E-Visit for Ear Pain - Acute Otitis Media   We are sorry that you are not feeling well. Here is how we plan to help!  Based on what you have shared with me it looks like you have Acute Otitis Media.  Acute Otitis Media is an infection of the middle or "inner" ear. This type of infection can cause redness, inflammation, and fluid buildup behind the tympanic membrane (ear drum).  The usual symptoms include: Earache/Pain Fever Upper respiratory symptoms Lack of energy/Fatigue/Malaise Slight hearing loss gradually worsening- if the inner ear fills with fluid What causes middle ear infections? Most middle ear infections occur when an infection such as a cold, leads to a build-up of mucus in the middle ear and causes the Eustachian tube (a thin tube that runs from the middle ear to the back of the nose) to become swollen or blocked.   This means mucus can't drain away properly, making it easier for an infection to spread into the middle ear.  How middle ear infections are treated: Most ear infections clear up within three to five days and don't need any specific treatment. If necessary, tylenol or ibuprofen should be used to relieve pain and a high temperature.  If you develop a fever higher than 102, or any significantly worsening symptoms, this could indicate a more serious infection moving to the middle/inner and needs face to face evaluation in an office by a provider.   Antibiotics aren't routinely used to treat middle ear infections, although they may occasionally be prescribed if symptoms persist or are particularly severe. Given your presentation,   I have prescribed Amoxicillin 875 mg one tablet twice daily for 10 days   Your symptoms should improve over the next 3 days and should resolve in about 7 days. Be sure to complete ALL of the prescription(s) given.  HOME CARE: Wash your hands frequently. If you are prescribed an ear drop, do not place the tip of the bottle on your ear or  touch it with your fingers. You can take Acetaminophen 650 mg every 4-6 hours as needed for pain.  If pain is severe or moderate, you can apply a heating pad (set on low) or hot water bottle (wrapped in a towel) to outer ear for 20 minutes.  This will also increase drainage.  GET HELP RIGHT AWAY IF: Fever is over 102.2 degrees. You develop progressive ear pain or hearing loss. Ear symptoms persist longer than 3 days after treatment.  MAKE SURE YOU: Understand these instructions. Will watch your condition. Will get help right away if you are not doing well or get worse.  Thank you for choosing an e-visit.  Your e-visit answers were reviewed by a board certified advanced clinical practitioner to complete your personal care plan. Depending upon the condition, your plan could have included both over the counter or prescription medications.  Please review your pharmacy choice. Make sure the pharmacy is open so you can pick up the prescription now. If there is a problem, you may contact your provider through [employer] and have the prescription routed to another pharmacy.  Your safety is important to [place]. If you have drug allergies check your prescription carefully.   For the next 24 hours you can use MyChart to ask questions about today's visit, request a non-urgent call back, or ask for a work or school excuse. You will get an email with a survey after your eVisit asking about your experience. We would appreciate your feedback. I hope  that your e-visit has been valuable and will aid in your recovery.

## 2024-01-19 NOTE — Progress Notes (Signed)
 I have spent 5 minutes in review of e-visit questionnaire, review and updating patient chart, medical decision making and response to patient.   [name], PA-C

## 2024-01-20 ENCOUNTER — Emergency Department (HOSPITAL_BASED_OUTPATIENT_CLINIC_OR_DEPARTMENT_OTHER)
Admission: EM | Admit: 2024-01-20 | Discharge: 2024-01-20 | Disposition: A | Discharge status: Home / Self Care | Payer: Medicaid Other | Attending: Emergency Medicine | Admitting: Emergency Medicine

## 2024-01-20 ENCOUNTER — Other Ambulatory Visit: Payer: Self-pay

## 2024-01-20 ENCOUNTER — Encounter (HOSPITAL_BASED_OUTPATIENT_CLINIC_OR_DEPARTMENT_OTHER): Payer: Self-pay

## 2024-01-20 DIAGNOSIS — Z20822 Contact with and (suspected) exposure to covid-19: Secondary | ICD-10-CM | POA: Diagnosis not present

## 2024-01-20 DIAGNOSIS — E119 Type 2 diabetes mellitus without complications: Secondary | ICD-10-CM | POA: Insufficient documentation

## 2024-01-20 DIAGNOSIS — R197 Diarrhea, unspecified: Secondary | ICD-10-CM | POA: Diagnosis not present

## 2024-01-20 DIAGNOSIS — R1084 Generalized abdominal pain: Secondary | ICD-10-CM | POA: Diagnosis present

## 2024-01-20 DIAGNOSIS — J45909 Unspecified asthma, uncomplicated: Secondary | ICD-10-CM | POA: Insufficient documentation

## 2024-01-20 DIAGNOSIS — Z7951 Long term (current) use of inhaled steroids: Secondary | ICD-10-CM | POA: Insufficient documentation

## 2024-01-20 LAB — CBC WITH DIFFERENTIAL/PLATELET
Abs Immature Granulocytes: 0.04 10*3/uL (ref 0.00–0.07)
Basophils Absolute: 0 10*3/uL (ref 0.0–0.1)
Basophils Relative: 0 %
Eosinophils Absolute: 0.1 10*3/uL (ref 0.0–0.5)
Eosinophils Relative: 0 %
HCT: 43.1 % (ref 36.0–46.0)
Hemoglobin: 13.9 g/dL (ref 12.0–15.0)
Immature Granulocytes: 0 %
Lymphocytes Relative: 9 %
Lymphs Abs: 1.3 10*3/uL (ref 0.7–4.0)
MCH: 27.5 pg (ref 26.0–34.0)
MCHC: 32.3 g/dL (ref 30.0–36.0)
MCV: 85.3 fL (ref 80.0–100.0)
Monocytes Absolute: 1 10*3/uL (ref 0.1–1.0)
Monocytes Relative: 6 %
Neutro Abs: 13.4 10*3/uL — ABNORMAL HIGH (ref 1.7–7.7)
Neutrophils Relative %: 85 %
Platelets: 390 10*3/uL (ref 150–400)
RBC: 5.05 MIL/uL (ref 3.87–5.11)
RDW: 14.4 % (ref 11.5–15.5)
WBC: 15.8 10*3/uL — ABNORMAL HIGH (ref 4.0–10.5)
nRBC: 0 % (ref 0.0–0.2)

## 2024-01-20 LAB — COMPREHENSIVE METABOLIC PANEL
ALT: 18 U/L (ref 0–44)
AST: 11 U/L — ABNORMAL LOW (ref 15–41)
Albumin: 3.7 g/dL (ref 3.5–5.0)
Alkaline Phosphatase: 36 U/L — ABNORMAL LOW (ref 38–126)
Anion gap: 7 (ref 5–15)
BUN: 11 mg/dL (ref 6–20)
CO2: 25 mmol/L (ref 22–32)
Calcium: 8.3 mg/dL — ABNORMAL LOW (ref 8.9–10.3)
Chloride: 105 mmol/L (ref 98–111)
Creatinine, Ser: 0.45 mg/dL (ref 0.44–1.00)
GFR, Estimated: >60 mL/min (ref >60)
Glucose, Bld: 115 mg/dL — ABNORMAL HIGH (ref 70–99)
Potassium: 3.6 mmol/L (ref 3.5–5.1)
Sodium: 137 mmol/L (ref 135–145)
Total Bilirubin: 0.6 mg/dL (ref 0.0–1.2)
Total Protein: 6.8 g/dL (ref 6.5–8.1)

## 2024-01-20 LAB — RESP PANEL BY RT-PCR (RSV, FLU A&B, COVID)  RVPGX2
Influenza A by PCR: NEGATIVE
Influenza B by PCR: NEGATIVE
Resp Syncytial Virus by PCR: NEGATIVE
SARS Coronavirus 2 by RT PCR: NEGATIVE

## 2024-01-20 LAB — HCG, SERUM, QUALITATIVE: Preg, Serum: NEGATIVE

## 2024-01-20 MED ORDER — SODIUM CHLORIDE 0.9 % IV SOLN
25.0000 mg | Freq: Once | INTRAVENOUS | Status: DC
  Administered 2024-01-20: 25 mg via INTRAVENOUS
  Filled 2024-01-20: qty 1

## 2024-01-20 MED ORDER — METOCLOPRAMIDE HCL 5 MG/ML IJ SOLN
10.0000 mg | Freq: Once | INTRAMUSCULAR | Status: AC
  Administered 2024-01-20: 10 mg via INTRAVENOUS
  Filled 2024-01-20: qty 2

## 2024-01-20 MED ORDER — LACTATED RINGERS IV BOLUS
1000.0000 mL | Freq: Once | INTRAVENOUS | Status: AC
  Administered 2024-01-20: 1000 mL via INTRAVENOUS

## 2024-01-20 MED ORDER — KETOROLAC TROMETHAMINE 30 MG/ML IJ SOLN
30.0000 mg | Freq: Once | INTRAMUSCULAR | Status: AC
  Administered 2024-01-20: 30 mg via INTRAVENOUS
  Filled 2024-01-20: qty 1

## 2024-01-20 MED ORDER — METOCLOPRAMIDE HCL 10 MG PO TABS: 10.0000 mg | ORAL_TABLET | Freq: Three times a day (TID) | ORAL | 0 refills | Status: DC | PRN

## 2024-01-20 MED ORDER — PROMETHAZINE HCL 25 MG/ML IJ SOLN
INTRAMUSCULAR | Status: AC
  Filled 2024-01-20: qty 1

## 2024-01-20 MED ORDER — SODIUM CHLORIDE 0.9 % IV SOLN
25.0000 mg | Freq: Four times a day (QID) | INTRAVENOUS | Status: DC | PRN
  Filled 2024-01-20: qty 1

## 2024-01-20 NOTE — ED Triage Notes (Signed)
 In for eval of flu-like symptoms, nausea, vomiting, diarrhea, abd pain, and fluid filled ears. Had Evisit yesterday, diag with ear infection - rx for amoxicillin . Completed prednisone  yesterday for previous diag for sinus infection. Reports blood in diarrhea.

## 2024-01-20 NOTE — Discharge Instructions (Signed)
 Courtney Holloway ,  You were seen for persistent nausea, vomiting, and diarrhea.  This is likely from a viral infection to your numerous sick exposures.  You were given fluids, nausea medications, and pain medication to help with your and symptoms.  We feel that you are safe for discharge at this time.  I have prescribed an as needed nausea medication for you to take in the coming days.  Furthermore, you were prescribed amoxicillin  yesterday.  We feel that you can stop taking this medication as this is likely worsening your diarrhea.  We suspect that you will feel better in the coming days.  Please continue to rest and maintain adequate hydration.  If your signs and symptoms worsen or do not improve by the end of the weekend, please follow-up.

## 2024-01-20 NOTE — ED Provider Notes (Cosign Needed)
 [place] EMERGENCY DEPARTMENT AT [facility] Provider Note   CSN: [id number] Arrival date & time: 01/20/24  9293  History Past Medical History:  Diagnosis Date   Anxiety    Asthma    Depression    Diabetes mellitus during pregnancy in second trimester 09/15/2022   DMDD (disruptive mood dysregulation disorder) (HCC) 12/28/2015   Failed vision screen 11/21/2014   GAD (generalized anxiety disorder)    Gestational diabetes    Headache(784.0)    HSV-1 (herpes simplex virus 1) infection 08/02/2020   Obesity 11/21/2014   Vision abnormalities    wears glasses   Chief Complaint  Patient presents with   Abdominal Pain    Shannan  Zonzarae Clendenin is a 23 y.o. female.  Nyelah  Epstein is a 23 year old female presenting to the ED with 1 day history of vomiting, nausea, diarrhea, chills, and diaphoresis.  She was seen roughly 1 week ago in the ED with concerns for bronchitis.  She was last seen through televisit yesterday and was thought to have otitis media in which she was started on amoxicillin .  Since this time, she has had worsening diarrhea and vomiting.  She has had roughly 10 episodes of vomiting and 5 episodes of diarrhea.  She is unable to hold down solids or liquids.  She denies any fevers.  She does note that her son has been sick with multiple diuresis.  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  Additionally, she endorses sinus congestion, cough, and ear pain.     Abdominal Pain Pain location:  Generalized Pain quality: dull   Pain radiates to:  Does not radiate Pain severity:  Moderate Duration:  1 day Timing:  Constant Progression:  Unchanged Chronicity:  New Context: recent illness and sick contacts   Relieved by:  Lying down Worsened by:  Eating Ineffective treatments:  Position changes Associated symptoms: chest pain, chills and diarrhea   Associated symptoms: no sore throat     Home Medications Prior to Admission medications    Medication Sig Start Date End Date Taking? Authorizing Provider  fluticasone  (FLONASE ) 50 MCG/ACT nasal spray Place 2 sprays into both nostrils 2 (two) times daily. 11/09/23 11/08/24 Yes [provider]  metoCLOPramide  (REGLAN ) 10 MG tablet Take 1 tablet (10 mg total) by mouth 3 (three) times daily with meals as needed for up to 6 doses for nausea. 01/20/24  Yes Stephanie Freund, MD  valACYclovir  (VALTREX ) 1000 MG tablet Take 1,000 mg by mouth 2 (two) times daily as needed. 01/13/24  Yes [provider]  albuterol  (VENTOLIN  HFA) 108 (90 Base) MCG/ACT inhaler Inhale 1-2 puffs into the lungs every 6 (six) hours as needed for wheezing or shortness of breath. 01/13/24   Jerrol Agent, MD  amoxicillin  (AMOXIL ) 875 MG tablet Take 1 tablet (875 mg total) by mouth 2 (two) times daily for 10 days. 01/19/24 01/29/24  Gladis Elsie BROCKS, PA-C  ARIPiprazole  (ABILIFY ) 5 MG tablet Take 1 tablet (5 mg total) by mouth daily. 09/23/23   Ozell Heron HERO, MD  benzonatate  (TESSALON ) 100 MG capsule Take 1 capsule (100 mg total) by mouth every 8 (eight) hours. 01/13/24   Jerrol Agent, MD  etonogestrel  (NEXPLANON ) 68 MG IMPL implant 1 each by Subdermal route once.    [provider]  guanFACINE  (TENEX ) 1 MG tablet Take 1 tablet (1 mg total) by mouth at bedtime. 11/05/23   Ozell Heron HERO, MD  ibuprofen  (ADVIL ) 600 MG tablet Take 1 tablet (600 mg  total) by mouth every 8 (eight) hours as needed. 09/03/23   Tabori, Katherine E, MD  methylphenidate  (METADATE  CD) 10 MG CR capsule Take 1 capsule (10 mg total) by mouth every morning. 11/05/23   Ozell Heron HERO, MD  methylphenidate  (METADATE  CD) 10 MG CR capsule Take 1 capsule (10 mg total) by mouth every morning. 12/03/23   Ozell Heron HERO, MD  methylphenidate  (METADATE  CD) 10 MG CR capsule Take 1 capsule (10 mg total) by mouth every morning. 12/30/23   Ozell Heron HERO, MD      Allergies    Other, Pineapple, Fish-derived products, and Zofran   [ondansetron  hcl]    Review of Systems   Review of Systems  Constitutional:  Positive for chills.  HENT:  Negative for sore throat.   Cardiovascular:  Positive for chest pain.  Gastrointestinal:  Positive for abdominal pain and diarrhea.   Physical Exam Updated Vital Signs BP 99/69   Pulse 93   Temp 98.2 F (36.8 C)   Resp 16   Ht 5' 1 (1.549 m)   Wt 104.3 kg   SpO2 98%   BMI 43.46 kg/m  Physical Exam HENT:     Head: Normocephalic and atraumatic.     Mouth/Throat:     Pharynx: Oropharynx is clear.  Cardiovascular:     Rate and Rhythm: Regular rhythm. Tachycardia present.     Heart sounds: Normal heart sounds.  Pulmonary:     Effort: Pulmonary effort is normal.     Breath sounds: Normal breath sounds.  Abdominal:     General: Abdomen is flat. Bowel sounds are increased.     Palpations: Abdomen is soft.     Tenderness: There is generalized abdominal tenderness.  Skin:    Capillary Refill: Capillary refill takes less than 2 seconds.  Neurological:     General: No focal deficit present.     Mental Status: She is alert.  Psychiatric:        Mood and Affect: Mood normal.        Behavior: Behavior normal.     ED Results / Procedures / Treatments   Labs (all labs ordered are listed, but only abnormal results are displayed) Labs Reviewed  COMPREHENSIVE METABOLIC PANEL - Abnormal; Notable for the following components:      Result Value   Glucose, Bld 115 (*)    Calcium  8.3 (*)    AST 11 (*)    Alkaline Phosphatase 36 (*)    All other components within normal limits  CBC WITH DIFFERENTIAL/PLATELET - Abnormal; Notable for the following components:   WBC 15.8 (*)    Neutro Abs 13.4 (*)    All other components within normal limits  RESP PANEL BY RT-PCR (RSV, FLU A&B, COVID)  RVPGX2  HCG, SERUM, QUALITATIVE    EKG None  Radiology No results found.  Medications Ordered in ED Medications  promethazine  (PHENERGAN ) 25 mg in sodium chloride  0.9 % 50 mL IVPB (has  no administration in time range)  lactated ringers  bolus 1,000 mL (0 mLs Intravenous Stopped 01/20/24 1026)  ketorolac  (TORADOL ) 30 MG/ML injection 30 mg (30 mg Intravenous Given 01/20/24 1025)  metoCLOPramide  (REGLAN ) injection 10 mg (10 mg Intravenous Given 01/20/24 1046)    ED Course/ Medical Decision Making/ A&P  Medical Decision Making 23 year old female with 1 day history of vomiting diarrhea in context of 1 week history of viral illness.  Differential diagnosis includes but not limited to gastroenteritis, food poisoning, viral infection, IBS, IBD, medication intolerance, gallstones,  appendicitis, or pancreatitis.  Amount and/or Complexity of Data Reviewed Labs: ordered.    Details: CBC demonstrates leukocytosis of 15.8.  CMP largely unremarkable.  Pregnancy test negative.  Negative for COVID. Discussion of management or test interpretation with external provider(s): Patient presents with 1 day history of nausea, vomiting, and diarrhea with associated diffuse myalgias, generalized abdominal tenderness, chills, and diaphoresis.  Patient was diagnosed with bronchitis roughly 1 week ago and is given appropriate treatment.  She was seen through televisit yesterday in which she was diagnosed with otitis media despite no ear exam.  She has been afebrile but is tachycardic likely secondary to hypovolemia from vomiting and diarrhea.  I suspect that her diarrhea has been exacerbated by amoxicillin .  I have a low suspicion for otitis media so we will likely discontinue her on this medication.  Her current presentation is likely multifactorial due to her numerous sick contacts at home with her son and at work in a nursing home.  I will begin her on IV fluids and promethazine  as she noted previous success with this antiemetic.  I will also check a CBC and CMP for other concomitant abnormalities.  Her CBC is notable for leukocytosis which I suspect is secondary from her recent prednisone  treatment. Pregnancy test  negative so patient was given Toradol  to help with pain. She still had some lingering nausea post promethazine  so she was given 1 dose of Reglan .  I feel that she is safe for discharge as her symptoms improved after antiemetics, pain medications, and fluids.  She was also counseled on stopping her amoxicillin  as this was likely worsening her diarrhea.  Risk Prescription drug management.   Final Clinical Impression(s) / ED Diagnoses Final diagnoses:  Diarrhea, unspecified type    Rx / DC Orders ED Discharge Orders          Ordered    metoCLOPramide  (REGLAN ) 10 MG tablet  3 times daily with meals PRN        01/20/24 1135              Stephanie Freund, MD 01/20/24 1142

## 2024-01-22 ENCOUNTER — Encounter: Payer: Medicaid Other | Admitting: Family Medicine

## 2024-01-25 ENCOUNTER — Telehealth: Payer: Medicaid Other | Admitting: Physician Assistant

## 2024-01-25 DIAGNOSIS — H6503 Acute serous otitis media, bilateral: Secondary | ICD-10-CM

## 2024-01-25 MED ORDER — AMOXICILLIN-POT CLAVULANATE 875-125 MG PO TABS: 1.0000 | ORAL_TABLET | Freq: Two times a day (BID) | ORAL | 0 refills | Status: DC

## 2024-01-25 MED ORDER — CIPROFLOXACIN-DEXAMETHASONE 0.3-0.1 % OT SUSP: 4.0000 [drp] | Freq: Two times a day (BID) | OTIC | 0 refills | Status: DC

## 2024-01-25 NOTE — Progress Notes (Signed)

## 2024-01-29 ENCOUNTER — Ambulatory Visit: Payer: Medicaid Other | Admitting: Allergy

## 2024-01-31 ENCOUNTER — Encounter: Payer: Self-pay | Admitting: Family Medicine

## 2024-02-01 MED ORDER — FLUCONAZOLE 150 MG PO TABS: 150.0000 mg | ORAL_TABLET | Freq: Once | ORAL | 0 refills | Status: AC

## 2024-02-05 ENCOUNTER — Ambulatory Visit: Payer: Medicaid Other | Admitting: Family Medicine

## 2024-02-10 ENCOUNTER — Ambulatory Visit: Payer: Medicaid Other | Admitting: Family

## 2024-02-19 ENCOUNTER — Ambulatory Visit: Payer: Medicaid Other | Admitting: Family Medicine

## 2024-02-19 ENCOUNTER — Telehealth: Admitting: Family Medicine

## 2024-02-19 DIAGNOSIS — L709 Acne, unspecified: Secondary | ICD-10-CM | POA: Diagnosis not present

## 2024-02-19 MED ORDER — CLINDAMYCIN PHOS-BENZOYL PEROX 1-5 % EX GEL: Freq: Two times a day (BID) | CUTANEOUS | 0 refills | Status: DC

## 2024-02-19 NOTE — Progress Notes (Signed)
 E-Visit for Acne   We are sorry that you are experiencing this issue.  Here is how we plan to help!  Based on what you shared with me it looks like you have cystic acne.  Acne is a disorder of the hair follicles and oil glands (sebaceous glands). The sebaceous glands secrete oils to keep the skin moist.  When the glands get clogged, it can lead to pimples or cysts.  These cysts may become infected and leave scars. Acne is very common and normally occurs at puberty.  Acne is also inherited.  Your personal care plan consists of the following recommendations:  I recommend that you use a daily cleanser  You might try an over the counter cleanser that has benzoyl peroxide.  I recommend that you start with a product that has 2.5% benzoyl peroxide.  Stronger concentrations have not been shown to be more effective.  I have prescribed a topical gel with an antibiotic:  Clindamycin-benzoyl peroxide gel.  This gel should be applied to the affected areas twice a day.  Be sure to read the package insert to understand potential side effects.  If excessive dryness or peeling occurs, reduce dose frequency or concentration of the topical scrubs.  If excessive stinging or burning occurs, remove the topical gel with mild soap and water and resume at a lower dose the next day.  Remember oral antibiotics and topical acne treatments may increase your sensitivity to the sun!  HOME CARE: Do not squeeze pimples because that can often lead to infections, worse acne, and scars. Use a moisturizer that contains retinoid or fruit acids that may inhibit the development of new acne lesions. Although there is not a clear link that foods can cause acne, doctors do believe that too many sweets predispose you to skin problems.  GET HELP RIGHT AWAY IF: If your acne gets worse or is not better within 10 days. If you become depressed. If you become pregnant, discontinue medications and call your OB/GYN.  MAKE SURE  YOU: Understand these instructions. Will watch your condition. Will get help right away if you are not doing well or get worse.  Thank you for choosing an e-visit.  Your e-visit answers were reviewed by a board certified advanced clinical practitioner to complete your personal care plan. Depending upon the condition, your plan could have included both over the counter or prescription medications.  Please review your pharmacy choice. Make sure the pharmacy is open so you can pick up prescription now. If there is a problem, you may contact your provider through [employer] and have the prescription routed to another pharmacy.  Your safety is important to [place]. If you have drug allergies check your prescription carefully.   For the next 24 hours you can use MyChart to ask questions about today's visit, request a non-urgent call back, or ask for a work or school excuse. You will get an email in the next two days asking about your experience. I hope that your e-visit has been valuable and will speed your recovery.    have provided 5 minutes of non face to face time during this encounter for chart review and documentation.

## 2024-02-22 MED ORDER — CLINDAMYCIN PHOS-BENZOYL PEROX 1.2-5 % EX GEL
CUTANEOUS | 0 refills | Status: DC
Start: 2024-02-22 — End: 2024-02-26

## 2024-02-22 NOTE — Addendum Note (Signed)
 Addended by: Margaretann Loveless on: 02/22/2024 12:23 PM   Modules accepted: Orders

## 2024-02-26 ENCOUNTER — Encounter: Payer: Self-pay | Admitting: Family Medicine

## 2024-02-26 ENCOUNTER — Telehealth: Admitting: Family Medicine

## 2024-02-26 DIAGNOSIS — B3731 Acute candidiasis of vulva and vagina: Secondary | ICD-10-CM | POA: Diagnosis not present

## 2024-02-26 MED ORDER — CLINDAMYCIN PHOS-BENZOYL PEROX 1.2-5 % EX GEL: CUTANEOUS | 0 refills | Status: DC

## 2024-02-26 MED ORDER — FLUCONAZOLE 150 MG PO TABS: 150.0000 mg | ORAL_TABLET | Freq: Every day | ORAL | 0 refills | Status: AC

## 2024-02-26 NOTE — Progress Notes (Signed)

## 2024-02-26 NOTE — Addendum Note (Signed)
 Addended by: Georgana Curio on: 02/26/2024 07:55 PM   Modules accepted: Orders

## 2024-03-01 ENCOUNTER — Ambulatory Visit: Admitting: Family Medicine

## 2024-03-04 ENCOUNTER — Encounter: Payer: Self-pay | Admitting: Family Medicine

## 2024-03-04 ENCOUNTER — Other Ambulatory Visit (HOSPITAL_COMMUNITY)
Admission: RE | Admit: 2024-03-04 | Discharge: 2024-03-04 | Disposition: A | Discharge status: Home / Self Care | Source: Ambulatory Visit | Attending: Family Medicine | Admitting: Family Medicine

## 2024-03-04 ENCOUNTER — Ambulatory Visit (INDEPENDENT_AMBULATORY_CARE_PROVIDER_SITE_OTHER): Admitting: Family Medicine

## 2024-03-04 VITALS — BP 102/80 | HR 90 | Temp 98.5°F | Ht 61.0 in | Wt 247.4 lb

## 2024-03-04 DIAGNOSIS — N898 Other specified noninflammatory disorders of vagina: Secondary | ICD-10-CM | POA: Insufficient documentation

## 2024-03-04 DIAGNOSIS — Z114 Encounter for screening for human immunodeficiency virus [HIV]: Secondary | ICD-10-CM

## 2024-03-04 DIAGNOSIS — Z7251 High risk heterosexual behavior: Secondary | ICD-10-CM | POA: Diagnosis not present

## 2024-03-04 DIAGNOSIS — F332 Major depressive disorder, recurrent severe without psychotic features: Secondary | ICD-10-CM

## 2024-03-04 DIAGNOSIS — R7303 Prediabetes: Secondary | ICD-10-CM

## 2024-03-04 DIAGNOSIS — R102 Pelvic and perineal pain: Secondary | ICD-10-CM | POA: Diagnosis not present

## 2024-03-04 DIAGNOSIS — Z1159 Encounter for screening for other viral diseases: Secondary | ICD-10-CM

## 2024-03-04 DIAGNOSIS — F902 Attention-deficit hyperactivity disorder, combined type: Secondary | ICD-10-CM

## 2024-03-04 DIAGNOSIS — F411 Generalized anxiety disorder: Secondary | ICD-10-CM

## 2024-03-04 LAB — HEMOGLOBIN A1C: Hgb A1c MFr Bld: 6.3 % (ref 4.6–6.5)

## 2024-03-04 MED ORDER — WEGOVY 1 MG/0.5ML ~~LOC~~ SOAJ: 1.0000 mg | SUBCUTANEOUS | 0 refills | Status: DC

## 2024-03-04 MED ORDER — WEGOVY 0.5 MG/0.5ML ~~LOC~~ SOAJ
0.5000 mg | SUBCUTANEOUS | 0 refills | Status: DC
Start: 2024-03-04 — End: 2024-05-20

## 2024-03-04 MED ORDER — BUPROPION HCL ER (XL) 150 MG PO TB24: 150.0000 mg | ORAL_TABLET | Freq: Every day | ORAL | 0 refills | Status: DC

## 2024-03-04 MED ORDER — WEGOVY 0.25 MG/0.5ML ~~LOC~~ SOAJ: 0.2500 mg | SUBCUTANEOUS | 0 refills | Status: DC

## 2024-03-04 MED ORDER — LAMOTRIGINE 50 MG PO TBDP: ORAL_TABLET | ORAL | 2 refills | Status: DC

## 2024-03-04 NOTE — Patient Instructions (Addendum)
 Total calorie intake: 1900 per day  Total carbohydrate intake: less than 100 grams per day

## 2024-03-04 NOTE — Assessment & Plan Note (Signed)
 I have had an extensive 30 minute conversation today with the patient about healthy eating habits, exercise, calorie and carb goals for sustainable and successful weight loss. I gave the patient caloric and protein daily intake values as well as described the importance of increasing fiber and water intake. I discussed weight loss medications that could be used in the treatment of this patient. Handouts on low carb eating were given to the patient.    The calorie/ carb restrictions I gave her are listed below: Total calorie intake: 1900 per day  Total carbohydrate intake: less than 100 grams per day   Patient has prediabetes and chronic back pain as her co morbid conditions. I have sent in scripts for wegovy to be started. She will continue diet and exercise while she is taking the medication. RTC every 3 months for weight checks.

## 2024-03-04 NOTE — Progress Notes (Signed)
 Established Patient Office Visit  Subjective   Patient ID: Courtney Holloway, female    DOB: 2001/07/05  Age: 23 y.o. MRN: [id number]  Chief Complaint  Patient presents with   Medication Problem    Patient requests her medications for depression be changed   Vaginal Itching    Patient complains of vaginal itching and pain x1 week, noted after using body wash, tried Rx for yeast infection with no relief   Night Sweats    X2 months    Pt is here for follow up today. She reports that she is now in therapy, states that she was previously taking abilify now but feels like it is not working. States that   Pt reports a history of hemorrhoids and vaginal pain/ itching. States that she used a different body wash and it started shortly after. States that she took medication for yeast infection however this did not work. She also complains of vaginal pain as well, is requesting referral to GYN today. No pelvic pain, no fever or chills, no abnormal vaginal bleeding.   Pt reports a history of eczema in the past, states that her skin has been very dry recently. States she would like a referral to dermatology.  Patient also reports chronic back and muscle pain diffusely. She reports that she wants to lose weight, has had trouble with her weight since she was a child. She reports she has been reducing her calories to 1700  per day and she is exercising at the gym at least 3 days a week, however this is not working and she is not losing any weight.     Current Outpatient Medications  Medication Instructions   albuterol (VENTOLIN HFA) 108 (90 Base) MCG/ACT inhaler 1-2 puffs, Inhalation, Every 6 hours PRN   buPROPion (WELLBUTRIN XL) 150 mg, Oral, Daily   Clindamycin-Benzoyl Per, Refr, gel Apply topically twice daily onto clean skin   etonogestrel (NEXPLANON) 68 MG IMPL implant 1 each, Subdermal,  Once   fluticasone (FLONASE) 50 MCG/ACT nasal spray 2 sprays, 2 times daily   lamoTRIgine 50 MG TBDP  Take 0.5 tablets (25 mg total) by mouth at bedtime for 7 days, THEN 0.5 tablets (25 mg total) 2 (two) times daily for 7 days, THEN 1 tablet (50 mg total) 2 (two) times daily.   valACYclovir (VALTREX) 1,000 mg, 2 times daily PRN   Wegovy 0.25 mg, Subcutaneous, Weekly   Wegovy 0.5 mg, Subcutaneous, Weekly   Wegovy 1 mg, Subcutaneous, Weekly    Patient Active Problem List   Diagnosis Date Noted   Dysfunctional uterine bleeding 04/13/2023   Postpartum anemia 04/13/2023   Deficiency anemia 04/03/2023   Status post primary low transverse cesarean section 12/23/2022   BMI 50.0-59.9, adult (HCC) 12/04/2022   HSV (herpes simplex virus) anogenital infection 11/17/2022   Suicidal ideation 10/31/2022   GBS bacteriuria 10/06/2022   Morbid obesity (HCC) 07/16/2022   Elevated blood pressure reading without diagnosis of hypertension 01/13/2022   Body mass index (BMI) of 39.0 to 39.9 in adult 09/02/2021   Prediabetes 08/04/2021   Nightmares associated with chronic post-traumatic stress disorder 04/01/2021   Anal fissure 03/28/2021   Drug overdose, multiple drugs, undetermined intent, initial encounter 02/02/2021   Attention deficit hyperactivity disorder (ADHD), combined type 01/28/2021   MDD (major depressive disorder), recurrent severe, without psychosis (HCC) 01/16/2021   Dyslipidemia 02/15/2018   Hidradenitis axillaris 07/11/2016   Slow transit constipation 11/21/2014   Asthma 05/04/2013   GAD (generalized anxiety disorder) 11/02/2012  Post-traumatic stress disorder 11/02/2012      Review of Systems  All other systems reviewed and are negative.     Objective:     BP 102/80   Pulse 90   Temp 98.5 F (36.9 C) (Oral)   Ht 5\' 1"  (1.549 m)   Wt 247 lb 6.4 oz (112.2 kg)   SpO2 98%   BMI 46.75 kg/m    Physical Exam Vitals reviewed.  Constitutional:      Appearance: Normal appearance. She is well-groomed. She is morbidly obese.  Neck:     Thyroid: No thyromegaly.   Cardiovascular:     Rate and Rhythm: Normal rate and regular rhythm.     Heart sounds: S1 normal and S2 normal.  Pulmonary:     Effort: Pulmonary effort is normal.     Breath sounds: Normal breath sounds and air entry.  Musculoskeletal:     Right lower leg: No edema.     Left lower leg: No edema.  Neurological:     Mental Status: She is alert and oriented to person, place, and time. Mental status is at baseline.     Gait: Gait is intact.  Psychiatric:        Mood and Affect: Mood and affect normal.        Speech: Speech normal.        Behavior: Behavior normal.        Judgment: Judgment normal.      No results found for any visits on 03/04/24.    The ASCVD Risk score (Arnett DK, et al., 2019) failed to calculate for the following reasons:   The 2019 ASCVD risk score is only valid for ages 54 to 64    Assessment & Plan:  Vaginal discharge -     Cervicovaginal ancillary only  Vaginal pain -     Ambulatory referral to Gynecology  Unprotected sexual intercourse -     RPR; Future  Need for hepatitis C screening test -     Hepatitis C antibody; Future  Encounter for screening for HIV -     HIV Antibody (routine testing w rflx); Future  Prediabetes Assessment & Plan: We discussed this in the setting of obesity, see below. Checking new A1C today   Orders: -     Hemoglobin A1c; Future  MDD (major depressive disorder), recurrent severe, without psychosis (HCC) Assessment & Plan: Pt stopped the abilify, states it made her "feel funny" but wasn't very specific about her side effects. I recommended referral to psychiatry due to her complicated history and reactions to other medications she has tried, I offered to start bupropion and lamotrigine today, risks/benefits discussed with patient. She is agreeable to trying these medications.   Orders: -     Ambulatory referral to Psychiatry -     buPROPion HCl ER (XL); Take 1 tablet (150 mg total) by mouth daily.  Dispense: 90  tablet; Refill: 0 -     lamoTRIgine; Take 0.5 tablets (25 mg total) by mouth at bedtime for 7 days, THEN 0.5 tablets (25 mg total) 2 (two) times daily for 7 days, THEN 1 tablet (50 mg total) 2 (two) times daily.  Dispense: 60 tablet; Refill: 2  Attention deficit hyperactivity disorder (ADHD), combined type -     Ambulatory referral to Psychiatry -     buPROPion HCl ER (XL); Take 1 tablet (150 mg total) by mouth daily.  Dispense: 90 tablet; Refill: 0  GAD (generalized anxiety disorder) -  Ambulatory referral to Psychiatry  Morbid obesity [facility]) Assessment & Plan: I have had an extensive 30 minute conversation today with the patient about healthy eating habits, exercise, calorie and carb goals for sustainable and successful weight loss. I gave the patient caloric and protein daily intake values as well as described the importance of increasing fiber and water intake. I discussed weight loss medications that could be used in the treatment of this patient. Handouts on low carb eating were given to the patient.    The calorie/ carb restrictions I gave her are listed below: Total calorie intake: 1900 per day  Total carbohydrate intake: less than 100 grams per day   Patient has prediabetes and chronic back pain as her co morbid conditions. I have sent in scripts for wegovy to be started. She will continue diet and exercise while she is taking the medication. RTC every 3 months for weight checks.   Orders: -     Wegovy; Inject 0.25 mg into the skin once a week.  Dispense: 2 mL; Refill: 0 -     Wegovy; Inject 0.5 mg into the skin once a week.  Dispense: 2 mL; Refill: 0 -     Wegovy; Inject 1 mg into the skin once a week.  Dispense: 2 mL; Refill: 0     Return in about 3 months (around 06/04/2024) for weight loss.    Karie Georges, MD

## 2024-03-04 NOTE — Assessment & Plan Note (Signed)
 We discussed this in the setting of obesity, see below. Checking new A1C today

## 2024-03-04 NOTE — Assessment & Plan Note (Signed)
 Pt stopped the abilify, states it made her "feel funny" but wasn't very specific about her side effects. I recommended referral to psychiatry due to her complicated history and reactions to other medications she has tried, I offered to start bupropion and lamotrigine today, risks/benefits discussed with patient. She is agreeable to trying these medications.

## 2024-03-05 LAB — RPR: RPR Ser Ql: NONREACTIVE

## 2024-03-05 LAB — HEPATITIS C ANTIBODY: Hepatitis C Ab: NONREACTIVE

## 2024-03-05 LAB — HIV ANTIBODY (ROUTINE TESTING W REFLEX): HIV 1&2 Ab, 4th Generation: NONREACTIVE

## 2024-03-07 ENCOUNTER — Encounter: Payer: Self-pay | Admitting: Family Medicine

## 2024-03-07 LAB — CERVICOVAGINAL ANCILLARY ONLY
Bacterial Vaginitis (gardnerella): NEGATIVE
Candida Glabrata: NEGATIVE
Candida Vaginitis: NEGATIVE
Chlamydia: NEGATIVE
Comment: NEGATIVE
Comment: NEGATIVE
Comment: NEGATIVE
Comment: NEGATIVE
Comment: NEGATIVE
Comment: NORMAL
Neisseria Gonorrhea: NEGATIVE
Trichomonas: NEGATIVE

## 2024-03-09 ENCOUNTER — Other Ambulatory Visit (HOSPITAL_COMMUNITY): Payer: Self-pay

## 2024-03-09 ENCOUNTER — Telehealth: Payer: Self-pay | Admitting: *Deleted

## 2024-03-09 NOTE — Telephone Encounter (Signed)
 PCP received fax from [employer] [facility] (psych) stating the patient declined to schedule an appt as they are scheduling new patients into July and the patient stated this is too far out.  Message sent to referral coordinator as PCP requested to try another office.  Fax sent to be scanned.

## 2024-03-10 ENCOUNTER — Ambulatory Visit (INDEPENDENT_AMBULATORY_CARE_PROVIDER_SITE_OTHER): Admitting: Allergy & Immunology

## 2024-03-10 ENCOUNTER — Other Ambulatory Visit: Payer: Self-pay

## 2024-03-10 ENCOUNTER — Other Ambulatory Visit (HOSPITAL_COMMUNITY): Payer: Self-pay

## 2024-03-10 ENCOUNTER — Telehealth: Payer: Self-pay

## 2024-03-10 VITALS — BP 110/70 | HR 107 | Temp 97.7°F | Resp 12

## 2024-03-10 DIAGNOSIS — J4520 Mild intermittent asthma, uncomplicated: Secondary | ICD-10-CM

## 2024-03-10 DIAGNOSIS — L2089 Other atopic dermatitis: Secondary | ICD-10-CM

## 2024-03-10 DIAGNOSIS — L239 Allergic contact dermatitis, unspecified cause: Secondary | ICD-10-CM | POA: Diagnosis not present

## 2024-03-10 DIAGNOSIS — R1084 Generalized abdominal pain: Secondary | ICD-10-CM

## 2024-03-10 DIAGNOSIS — J3089 Other allergic rhinitis: Secondary | ICD-10-CM | POA: Diagnosis not present

## 2024-03-10 DIAGNOSIS — J302 Other seasonal allergic rhinitis: Secondary | ICD-10-CM

## 2024-03-10 NOTE — Progress Notes (Signed)
 FOLLOW UP  Date of Service/Encounter:  03/10/24   Assessment:   Mild intermittent asthma, uncomplicated  Seasonal and perennial allergic rhinitis  Allergic contact dermatitis  Flexural atopic dermatitis  Generalized abdominal pain  Plan/Recommendations:   1. Mild intermittent asthma, uncomplicated - Lung testing not done today. - Continue with albuterol as needed.   2. Seasonal and perennial allergic rhinitis - Continue with an antihistamine as needed.   3. Concern for food allergies (stomach pain and nausea) - We are going to get an alpha gal panel. - We are going to look for mast cell disease with a tryptase. - Food sheet given for testing at the next visit.   3. Allergic contact dermatitis  - Continue to avoid all of your triggering chemicals.  4. Flexural atopic dermatitis -  Continue with moisturizing as you are doing.   5. Return in about 2 weeks (around 03/24/2024) for SKIN TESTING (selected foods). You can have the follow up appointment with Dr. Dellis Anes or a Nurse Practicioner (our Nurse Practitioners are excellent and always have Physician oversight!).   Subjective:   Courtney Holloway is a 23 y.o. female presenting today for follow up of  Chief Complaint  Patient presents with   Allergy Testing    Wants to tested for alpha states when she eats meat her stomach hurts also has constipation.     Courtney Holloway has a history of the following: Patient Active Problem List   Diagnosis Date Noted   Dysfunctional uterine bleeding 04/13/2023   Postpartum anemia 04/13/2023   Deficiency anemia 04/03/2023   Status post primary low transverse cesarean section 12/23/2022   BMI 50.0-59.9, adult (HCC) 12/04/2022   HSV (herpes simplex virus) anogenital infection 11/17/2022   Suicidal ideation 10/31/2022   GBS bacteriuria 10/06/2022   Morbid obesity (HCC) 07/16/2022   Elevated blood pressure reading without diagnosis of hypertension 01/13/2022    Body mass index (BMI) of 39.0 to 39.9 in adult 09/02/2021   Prediabetes 08/04/2021   Nightmares associated with chronic post-traumatic stress disorder 04/01/2021   Anal fissure 03/28/2021   Drug overdose, multiple drugs, undetermined intent, initial encounter 02/02/2021   Attention deficit hyperactivity disorder (ADHD), combined type 01/28/2021   MDD (major depressive disorder), recurrent severe, without psychosis (HCC) 01/16/2021   Dyslipidemia 02/15/2018   Hidradenitis axillaris 07/11/2016   Slow transit constipation 11/21/2014   Asthma 05/04/2013   GAD (generalized anxiety disorder) 11/02/2012   Post-traumatic stress disorder 11/02/2012    History obtained from: chart review and patient.  Discussed the use of AI scribe software for clinical note transcription with the patient and/or guardian, who gave verbal consent to proceed.  [name] is a 23 y.o. female presenting for a follow up visit.  She was last seen by Dr. Vernell Leep in June 2022.  At that time, food allergy testing was negative to everything tested.  She had multiple indoor and outdoor environmental allergens.  She was started on Xyzal 5 mg daily as well as olopatadine 1 drop per eye daily as needed and Flonase.  Atopic dermatitis was under good control with moisturizing.  For her contact of otitis, patch testing was recommended.  She never followed up for that.  Asthma is under good control with albuterol as needed.  Since last visit, she has done fairly well.  Asthma/Respiratory Symptom History: She has a history of asthma, which is generally well-controlled except during physical exertion. She does not currently use albuterol and has not reported any recent exacerbations.  Allergic Rhinitis Symptom History: She has been previously tested for environmental allergies and was positive for several allergens, but she does not currently experience significant symptoms related to these allergies.   Food Allergy Symptom History: She  experiences significant gastrointestinal discomfort after consuming certain meats, particularly red meat and pork. Symptoms include severe stomach pain, constipation, and nausea, which have been persistent and impact her daily life, causing feelings of grogginess and fatigue.  Skin Symptom History: She also has dermatological issues, including a rash on her face that sometimes contains fluid or resembles acne. She mentions an incident where her ear 'popped like a pimple'. Despite changing soap and laundry detergent, these changes did not alleviate her symptoms. She experiences itching, particularly after showering. She has a history of eczema, which she manages with lotion application.  Otherwise, there have been no changes to her past medical history, surgical history, family history, or social history.    Review of systems otherwise negative other than that mentioned in the HPI.    Objective:   Blood pressure 110/70, pulse (!) 107, temperature 97.7 F (36.5 C), resp. rate 12, SpO2 96%, not currently breastfeeding. There is no height or weight on file to calculate BMI.    Physical Exam Vitals reviewed.  Constitutional:      Appearance: She is well-developed.     Comments: Friendly.  HENT:     Head: Normocephalic and atraumatic.     Right Ear: Tympanic membrane, ear canal and external ear normal. No drainage, swelling or tenderness. Tympanic membrane is not injected, scarred, erythematous, retracted or bulging.     Left Ear: Tympanic membrane, ear canal and external ear normal. No drainage, swelling or tenderness. Tympanic membrane is not injected, scarred, erythematous, retracted or bulging.     Nose: No nasal deformity, septal deviation, mucosal edema or rhinorrhea.     Right Turbinates: Enlarged, swollen and pale.     Left Turbinates: Enlarged, swollen and pale.     Right Sinus: No maxillary sinus tenderness or frontal sinus tenderness.     Left Sinus: No maxillary sinus  tenderness or frontal sinus tenderness.     Mouth/Throat:     Mouth: Mucous membranes are not pale and not dry.     Pharynx: Uvula midline.  Eyes:     General:        Right eye: No discharge.        Left eye: No discharge.     Conjunctiva/sclera: Conjunctivae normal.     Right eye: Right conjunctiva is not injected. No chemosis.    Left eye: Left conjunctiva is not injected. No chemosis.    Pupils: Pupils are equal, round, and reactive to light.  Cardiovascular:     Rate and Rhythm: Normal rate and regular rhythm.     Heart sounds: Normal heart sounds.  Pulmonary:     Effort: Pulmonary effort is normal. No tachypnea, accessory muscle usage or respiratory distress.     Breath sounds: Normal breath sounds. No wheezing, rhonchi or rales.  Chest:     Chest wall: No tenderness.  Abdominal:     Tenderness: There is no abdominal tenderness. There is no guarding or rebound.  Lymphadenopathy:     Head:     Right side of head: No submandibular, tonsillar or occipital adenopathy.     Left side of head: No submandibular, tonsillar or occipital adenopathy.     Cervical: No cervical adenopathy.  Skin:    Coloration: Skin is not pale.  Findings: No abrasion, erythema, petechiae or rash. Rash is not papular, urticarial or vesicular.  Neurological:     Mental Status: She is alert.  Psychiatric:        Behavior: Behavior is cooperative.      Diagnostic studies: Labs sent        Malachi Bonds, MD  [facility]

## 2024-03-10 NOTE — Patient Instructions (Addendum)
 1. Mild intermittent asthma, uncomplicated - Lung testing not done today. - Continue with albuterol as needed.   2. Seasonal and perennial allergic rhinitis - Continue with an antihistamine as needed.   3. Concern for food allergies (stomach pain and nausea) - We are going to get an alpha gal panel. - We are going to look for mast cell disease with a tryptase. - Food sheet given for testing at the next visit.   3. Allergic contact dermatitis  - Continue to avoid all of your triggering chemicals.  4. Flexural atopic dermatitis -  Continue with moisturizing as you are doing.   5. Return in about 2 weeks (around 03/24/2024) for SKIN TESTING (selected foods). You can have the follow up appointment with Dr. Dellis Anes or a Nurse Practicioner (our Nurse Practitioners are excellent and always have Physician oversight!).    Please inform us of any Emergency Department visits, hospitalizations, or changes in symptoms. Call us before going to the ED for breathing or allergy symptoms since we might be able to fit you in for a sick visit. Feel free to contact us anytime with any questions, problems, or concerns.  It was a pleasure to meet you today!  Websites that have reliable patient information: 1. American Academy of Asthma, Allergy, and Immunology: www.aaaai.org 2. Food Allergy Research and Education (FARE): foodallergy.org 3. Mothers of Asthmatics: http://www.asthmacommunitynetwork.org 4. American College of Allergy, Asthma, and Immunology: www.acaai.org      "Like" [place] on Facebook and Instagram for our latest updates!      A healthy democracy works best when [employer] participate! Make sure you are registered to vote! If you have moved or changed any of your contact information, you will need to get this updated before voting! Scan the QR codes below to learn more!

## 2024-03-10 NOTE — Telephone Encounter (Signed)
 Pharmacy Patient Advocate Encounter   Received notification from Patient Advice Request messages that prior authorization for [facility] is required/requested.   Insurance verification completed.   The patient is insured through [facility] MEDICAID .   Per test claim: PA required; PA submitted to above mentioned insurance via Fax Key/confirmation #/EOC --- Status is pending    FAX: [phone] PHONE: [phone]

## 2024-03-12 LAB — TRYPTASE: Tryptase: 6.8 ug/L (ref 2.2–13.2)

## 2024-03-14 ENCOUNTER — Encounter: Payer: Self-pay | Admitting: Allergy & Immunology

## 2024-03-16 ENCOUNTER — Ambulatory Visit: Admitting: Nurse Practitioner

## 2024-03-16 ENCOUNTER — Other Ambulatory Visit (HOSPITAL_COMMUNITY): Payer: Self-pay

## 2024-03-16 ENCOUNTER — Encounter: Payer: Self-pay | Admitting: Allergy & Immunology

## 2024-03-18 ENCOUNTER — Other Ambulatory Visit (HOSPITAL_COMMUNITY): Payer: Self-pay

## 2024-03-18 LAB — ALPHA-GAL PANEL
Allergen Lamb IgE: <0.1 kU/L
Beef IgE: <0.1 kU/L
IgE (Immunoglobulin E), Serum: 227 [IU]/mL (ref 6–495)
O215-IgE Alpha-Gal: <0.1 kU/L
Pork IgE: <0.1 kU/L

## 2024-03-18 NOTE — Telephone Encounter (Signed)
 Spoke with [place] at [facility] and informed her of the approval as below.  She stated the patient will receive an automated call when the Rx is ready.

## 2024-03-18 NOTE — Telephone Encounter (Signed)
 Pharmacy Patient Advocate Encounter  Received notification from [facility] MEDICAID that Prior Authorization for [facility] has been APPROVED from 03/10/24 to 09/05/24. Unable to obtain price due to refill too soon rejection, last fill date 03/14/24 next available fill date4/21/25

## 2024-03-22 ENCOUNTER — Encounter: Payer: Self-pay | Admitting: Family Medicine

## 2024-03-22 DIAGNOSIS — R110 Nausea: Secondary | ICD-10-CM

## 2024-03-22 MED ORDER — PROMETHAZINE HCL 12.5 MG PO TABS: 12.5000 mg | ORAL_TABLET | Freq: Three times a day (TID) | ORAL | 0 refills | Status: DC | PRN

## 2024-04-21 ENCOUNTER — Ambulatory Visit: Admitting: Allergy & Immunology

## 2024-04-21 ENCOUNTER — Ambulatory Visit: Admitting: Radiology

## 2024-04-24 ENCOUNTER — Encounter (HOSPITAL_BASED_OUTPATIENT_CLINIC_OR_DEPARTMENT_OTHER): Payer: Self-pay

## 2024-04-24 ENCOUNTER — Emergency Department (HOSPITAL_BASED_OUTPATIENT_CLINIC_OR_DEPARTMENT_OTHER): Admission: EM | Admit: 2024-04-24 | Discharge: 2024-04-24 | Disposition: A | Discharge status: Home / Self Care

## 2024-04-24 ENCOUNTER — Other Ambulatory Visit: Payer: Self-pay

## 2024-04-24 DIAGNOSIS — K0889 Other specified disorders of teeth and supporting structures: Secondary | ICD-10-CM | POA: Diagnosis present

## 2024-04-24 MED ORDER — OXYCODONE HCL 5 MG PO TABS
5.0000 mg | ORAL_TABLET | Freq: Four times a day (QID) | ORAL | 0 refills | Status: AC | PRN
Start: 2024-04-24 — End: 2024-04-26

## 2024-04-24 NOTE — Discharge Instructions (Signed)
 Please follow-up with your oral surgeon tomorrow.  As discussed take Tylenol  alternating with ibuprofen  every 4 hours for baseline pain control.  You may use the Roxicodone 's we have prescribed for breakthrough pain.  Return if develop fevers, chills, difficulty swallowing, difficulty breathing, tongue or lip swelling, muffled voice or any new or worsening symptoms that are concerning to you.

## 2024-04-24 NOTE — ED Provider Notes (Signed)
  EMERGENCY DEPARTMENT AT [facility] Provider Note   CSN: [id number] Arrival date & time: 04/24/24  7829     History  Chief Complaint  Patient presents with   Dental Pain    Courtney  Zonzarae Holloway is a 23 y.o. female.  24 year old female presents emergency department for dental pain.  Had wisdom teeth removed 5 days ago has had persistent pain since that time.  Unrelieved by Tylenol  ibuprofen .  No fevers no chills no difficulty swallowing, no difficulty breathing.   Dental Pain      Home Medications Prior to Admission medications   Medication Sig Start Date End Date Taking? Authorizing Provider  albuterol  (VENTOLIN  HFA) 108 (90 Base) MCG/ACT inhaler Inhale 1-2 puffs into the lungs every 6 (six) hours as needed for wheezing or shortness of breath. Patient not taking: Reported on 03/10/2024 01/13/24   Rosealee Concha, MD  buPROPion  (WELLBUTRIN  XL) 150 MG 24 hr tablet Take 1 tablet (150 mg total) by mouth daily. Patient not taking: Reported on 03/10/2024 03/04/24   Aida House, MD  Clindamycin -Benzoyl Per, Refr, gel Apply topically twice daily onto clean skin Patient not taking: Reported on 03/10/2024 02/26/24   Blair, Diane W, FNP  etonogestrel  (NEXPLANON ) 68 MG IMPL implant 1 each by Subdermal route once. Patient not taking: Reported on 03/10/2024    [provider]  fluticasone  (FLONASE ) 50 MCG/ACT nasal spray Place 2 sprays into both nostrils 2 (two) times daily. Patient not taking: Reported on 03/10/2024 11/09/23 11/08/24  [provider]  lamoTRIgine  50 MG TBDP Take 0.5 tablets (25 mg total) by mouth at bedtime for 7 days, THEN 0.5 tablets (25 mg total) 2 (two) times daily for 7 days, THEN 1 tablet (50 mg total) 2 (two) times daily. Patient not taking: Reported on 03/10/2024 03/04/24 04/17/24  Aida House, MD  promethazine  (PHENERGAN ) 12.5 MG tablet Take 1 tablet (12.5 mg total) by mouth every 8 (eight) hours as needed for nausea or  vomiting. 03/22/24   Aida House, MD  Semaglutide -Weight Management (WEGOVY ) 0.25 MG/0.5ML SOAJ Inject 0.25 mg into the skin once a week. Patient not taking: Reported on 03/10/2024 03/04/24   Aida House, MD  Semaglutide -Weight Management (WEGOVY ) 0.5 MG/0.5ML SOAJ Inject 0.5 mg into the skin once a week. Patient not taking: Reported on 03/10/2024 03/04/24   Aida House, MD  Semaglutide -Weight Management (WEGOVY ) 1 MG/0.5ML SOAJ Inject 1 mg into the skin once a week. Patient not taking: Reported on 03/10/2024 03/04/24   Aida House, MD  valACYclovir  (VALTREX ) 1000 MG tablet Take 1,000 mg by mouth 2 (two) times daily as needed. Patient not taking: Reported on 03/10/2024 01/13/24   [provider]      Allergies    Other, Pineapple, Fish-derived products, and Zofran  [ondansetron  hcl]    Review of Systems   Review of Systems  Physical Exam Updated Vital Signs BP 122/80 (BP Location: Right Arm)   Pulse 91   Temp 98.4 F (36.9 C) (Oral)   Resp 18   Ht 5\' 1"  (1.549 m)   Wt 102.1 kg   LMP  (LMP Unknown)   SpO2 97%   BMI 42.51 kg/m  Physical Exam Vitals and nursing note reviewed.  HENT:     Head: Normocephalic.     Mouth/Throat:     Mouth: Mucous membranes are moist.     Comments: No obvious infection or periapical abscess.  Uvula midline.  No muffled hot potato voice.  Floor mouth  soft. Eyes:     Conjunctiva/sclera: Conjunctivae normal.  Cardiovascular:     Rate and Rhythm: Normal rate.  Pulmonary:     Effort: Pulmonary effort is normal.  Musculoskeletal:     Cervical back: Normal range of motion and neck supple.  Skin:    General: Skin is dry.  Neurological:     Mental Status: She is alert and oriented to person, place, and time.  Psychiatric:        Mood and Affect: Mood normal.        Behavior: Behavior normal.     ED Results / Procedures / Treatments   Labs (all labs ordered are listed, but only abnormal results are displayed) Labs  Reviewed - No data to display  EKG None  Radiology No results found.  Procedures Procedures    Medications Ordered in ED Medications - No data to display  ED Course/ Medical Decision Making/ A&P                                 Medical Decision Making 23 year old with dental pain after wisdom teeth extraction.  Vital signs reassuring.  Exam not consistent with infectious etiology.  Poor pain control; discussed baseline pain medications with Tylenol  turning with ibuprofen .  Will give short course of Percocets, but instructed patient to follow-up with her oral surgeon first thing in the morning.  Voiced understanding.  Return precautions given         Final Clinical Impression(s) / ED Diagnoses Final diagnoses:  None    Rx / DC Orders ED Discharge Orders     None         Rolinda Climes, DO 04/24/24 9811

## 2024-04-24 NOTE — ED Triage Notes (Signed)
 She states she had "all 4 wisdom teeth removed" 5 days ago. She c/o persistent pain not relieved by Ibuprofen .

## 2024-05-05 ENCOUNTER — Ambulatory Visit: Admitting: Allergy & Immunology

## 2024-05-16 ENCOUNTER — Encounter: Payer: Self-pay | Admitting: Family Medicine

## 2024-05-17 ENCOUNTER — Telehealth: Payer: Self-pay | Admitting: *Deleted

## 2024-05-17 NOTE — Telephone Encounter (Signed)
 See previous Mychart message with same request.

## 2024-05-17 NOTE — Telephone Encounter (Signed)
 Copied from CRM [id number]. Topic: Clinical - Medical Advice >> May 16, 2024  4:56 PM Baldo Levan wrote: Reason for CRM: Patient called in stating that she needs an ESA letter for her therapy dog. Patients therapist told her she would have to contact her PCP for the letter, due to the therapist not having training to be able to write the letter. Patients therapist stated she is in the range to need it for anxiety and depression. Please contact the patient as soon as possible.

## 2024-05-19 ENCOUNTER — Emergency Department (HOSPITAL_COMMUNITY)
Admission: EM | Admit: 2024-05-19 | Discharge: 2024-05-20 | Disposition: A | Discharge status: Home / Self Care | Attending: Student | Admitting: Student

## 2024-05-19 DIAGNOSIS — J45909 Unspecified asthma, uncomplicated: Secondary | ICD-10-CM | POA: Insufficient documentation

## 2024-05-19 DIAGNOSIS — F332 Major depressive disorder, recurrent severe without psychotic features: Secondary | ICD-10-CM

## 2024-05-19 DIAGNOSIS — F32A Depression, unspecified: Secondary | ICD-10-CM | POA: Diagnosis present

## 2024-05-19 DIAGNOSIS — Z7951 Long term (current) use of inhaled steroids: Secondary | ICD-10-CM | POA: Diagnosis not present

## 2024-05-19 DIAGNOSIS — F411 Generalized anxiety disorder: Secondary | ICD-10-CM | POA: Diagnosis not present

## 2024-05-19 DIAGNOSIS — E119 Type 2 diabetes mellitus without complications: Secondary | ICD-10-CM | POA: Diagnosis not present

## 2024-05-19 DIAGNOSIS — F902 Attention-deficit hyperactivity disorder, combined type: Secondary | ICD-10-CM

## 2024-05-19 LAB — CBC WITH DIFFERENTIAL/PLATELET
Abs Immature Granulocytes: 0.07 10*3/uL (ref 0.00–0.07)
Basophils Absolute: 0 10*3/uL (ref 0.0–0.1)
Basophils Relative: 0 %
Eosinophils Absolute: 0 10*3/uL (ref 0.0–0.5)
Eosinophils Relative: 0 %
HCT: 42.2 % (ref 36.0–46.0)
Hemoglobin: 13.1 g/dL (ref 12.0–15.0)
Immature Granulocytes: 1 %
Lymphocytes Relative: 15 %
Lymphs Abs: 1.7 10*3/uL (ref 0.7–4.0)
MCH: 27.4 pg (ref 26.0–34.0)
MCHC: 31 g/dL (ref 30.0–36.0)
MCV: 88.3 fL (ref 80.0–100.0)
Monocytes Absolute: 0.9 10*3/uL (ref 0.1–1.0)
Monocytes Relative: 8 %
Neutro Abs: 8.9 10*3/uL — ABNORMAL HIGH (ref 1.7–7.7)
Neutrophils Relative %: 76 %
Platelets: 415 10*3/uL — ABNORMAL HIGH (ref 150–400)
RBC: 4.78 MIL/uL (ref 3.87–5.11)
RDW: 14.4 % (ref 11.5–15.5)
WBC: 11.6 10*3/uL — ABNORMAL HIGH (ref 4.0–10.5)
nRBC: 0 % (ref 0.0–0.2)

## 2024-05-19 LAB — COMPREHENSIVE METABOLIC PANEL WITH GFR
ALT: 17 U/L (ref 0–44)
AST: 14 U/L — ABNORMAL LOW (ref 15–41)
Albumin: 3.7 g/dL (ref 3.5–5.0)
Alkaline Phosphatase: 37 U/L — ABNORMAL LOW (ref 38–126)
Anion gap: 7 (ref 5–15)
BUN: 9 mg/dL (ref 6–20)
CO2: 24 mmol/L (ref 22–32)
Calcium: 8.7 mg/dL — ABNORMAL LOW (ref 8.9–10.3)
Chloride: 106 mmol/L (ref 98–111)
Creatinine, Ser: 0.56 mg/dL (ref 0.44–1.00)
GFR, Estimated: >60 mL/min (ref >60)
Glucose, Bld: 92 mg/dL (ref 70–99)
Potassium: 3.9 mmol/L (ref 3.5–5.1)
Sodium: 137 mmol/L (ref 135–145)
Total Bilirubin: 0.8 mg/dL (ref 0.0–1.2)
Total Protein: 7.1 g/dL (ref 6.5–8.1)

## 2024-05-19 LAB — RAPID URINE DRUG SCREEN, HOSP PERFORMED
Amphetamines: NOT DETECTED
Barbiturates: NOT DETECTED
Benzodiazepines: NOT DETECTED
Cocaine: NOT DETECTED
Opiates: NOT DETECTED
Tetrahydrocannabinol: NOT DETECTED

## 2024-05-19 LAB — ETHANOL: Alcohol, Ethyl (B): <15 mg/dL (ref <15)

## 2024-05-19 LAB — PREGNANCY, URINE: Preg Test, Ur: NEGATIVE

## 2024-05-19 MED ORDER — HYDROXYZINE HCL 25 MG PO TABS
25.0000 mg | ORAL_TABLET | Freq: Three times a day (TID) | ORAL | Status: DC | PRN
  Administered 2024-05-19: 25 mg via ORAL
  Filled 2024-05-19: qty 1

## 2024-05-19 MED ORDER — BUPROPION HCL ER (XL) 150 MG PO TB24
150.0000 mg | ORAL_TABLET | Freq: Every day | ORAL | Status: DC
  Administered 2024-05-19 – 2024-05-20 (×2): 150 mg via ORAL
  Filled 2024-05-19 (×2): qty 1

## 2024-05-19 MED ORDER — ACETAMINOPHEN 325 MG PO TABS
650.0000 mg | ORAL_TABLET | Freq: Four times a day (QID) | ORAL | Status: DC | PRN
  Administered 2024-05-19 – 2024-05-20 (×2): 650 mg via ORAL
  Filled 2024-05-19 (×2): qty 2

## 2024-05-19 MED ORDER — TRAZODONE HCL 50 MG PO TABS
50.0000 mg | ORAL_TABLET | Freq: Every evening | ORAL | Status: DC | PRN
  Administered 2024-05-19: 50 mg via ORAL
  Filled 2024-05-19: qty 1

## 2024-05-19 NOTE — ED Triage Notes (Signed)
 Patient Courtney Holloway voluntarily. Patient reports that she was at the park and called the police because she was overwhelmed. 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 She reports that her boyfriend told her today that he cheated on her, and how he cheated on her. She reports telling the police that she is Suicidal, but she is actually not. She has no plan or intentions on killing herself.
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
## 2024-05-19 NOTE — Consult Note (Addendum)
 [facility] Psychiatric Consult Initial  Patient Name: .Courtney  Zonzarae Holloway  MRN: [id number]  DOB: 01-Nov-2001  Consult Order details:  Orders (From admission, onward)     Start     Ordered   05/19/24 1534  CONSULT TO CALL ACT TEAM       Ordering Provider: Soto, Johana, PA-C  Provider:  (Not yet assigned)  Question:  Reason for Consult?  Answer:  depression   05/19/24 1534             Mode of Visit: In person    Psychiatry Consult Evaluation  Service Date: May 19, 2024 LOS:  LOS: 0 days  Chief Complaint suicidal thoughts  Primary Psychiatric Diagnoses  Major depressive disorder 2.  Generalized anxiety disorder  Assessment  Courtney  Zonzarae Holloway is a 23 y.o. female admitted: Presented to the ED on 05/19/2024  2:45 PM for suicidal thoughts with no plan or intent. She carries the psychiatric diagnoses of depression and anxiety and has a past medical history of prediabetes and anemia.   Her current presentation of sadness, feeling tired and thoughts of suicide is most consistent with undertreated depression. She meets criteria for overnight observation due to lack of collateral information.  Current outpatient psychotropic medications include Wellbutrin , Lamictal , and Concerta  and historically she has had a beneficial response to these medications. She was non compliant with medications prior to admission as evidenced by patient report. On initial examination, patient tearful, calm and cooperative. Please see plan below for detailed recommendations.   Diagnoses:  Active Hospital problems: Principal Problem:   MDD (major depressive disorder), recurrent severe, without psychosis (HCC) Active Problems:   GAD (generalized anxiety disorder)    Plan   ## Psychiatric Medication Recommendations:  Continue patient's home medication Give Atarax  25 mg p.o. 3 times daily as needed for anxiety  ## Medical Decision Making Capacity: Not specifically addressed in this encounter  ##  Further Work-up:  -- No further workup needed  EKG or UDS -- most recent EKG on 01/15/24 had QtC of 448 -- Pertinent labwork reviewed earlier this admission includes: CBC, CMP, EKG, UDS   ## Disposition:--Recommend overnight observation with reevaluation tomorrow. If patient has no behavioral events today or through out night, likely can psychiatrically clear with safety plan with mother and with medication prescriptions tomorrow.  Recommend that patient go over to behavioral health urgent care for observation if there is any bed availability.  ## Behavioral / Environmental: -To minimize splitting of staff, assign one staff person to communicate all information from the team when feasible. or Utilize compassion and acknowledge the patient's experiences while setting clear and realistic expectations for care.    ## Safety and Observation Level:  - Based on my clinical evaluation, I estimate the patient to be at low risk of self harm in the current setting. - At this time, we recommend  routine. This decision is based on my review of the chart including patient's history and current presentation, interview of the patient, mental status examination, and consideration of suicide risk including evaluating suicidal ideation, plan, intent, suicidal or self-harm behaviors, risk factors, and protective factors. This judgment is based on our ability to directly address suicide risk, implement suicide prevention strategies, and develop a safety plan while the patient is in the clinical setting. Please contact our team if there is a concern that risk level has changed.  CSSR Risk Category:C-SSRS RISK CATEGORY: No Risk  Suicide Risk Assessment: Patient has following modifiable risk factors for suicide: under  treated depression , which we are addressing by Recommend overnight observation with reevaluation tomorrow.  Patient has following non-modifiable or demographic risk factors for suicide: history of self  harm behavior and psychiatric hospitalization Patient has the following protective factors against suicide: Access to outpatient mental health care, Supportive family, and Minor children in the home  Thank you for this consult request. Recommendations have been communicated to the primary team.  We will recommend overnight observation with reevaluation tomorrow and continue to follow patient at this time.   Chandra Come, PMHNP       History of Present Illness  Relevant Aspects of Hospital ED Course:  Admitted on 05/19/2024 for depression.  Patient Report:  Courtney  Zonzarae Holloway, 23 y.o., female patient seen face to face by this provider, consulted with Dr. Deborah Falling; and chart reviewed on 05/19/24.  On evaluation Courtney  Zonzarae Holloway reports that she became upset when she found out her boyfriend was seeing another girl and cheating on her.  She states she and boyfriend been together for a long time, they have a 3-year-old child together, she states that she was very hurt, by what he told her and she did not know how to process it.  She states she tried to get in contact with her mother, her mother did not pick up the phone right away, so she called the police due to having suicidal thoughts.  Patient is currently in tears, adamantly denying any suicidal thoughts, or self-harm, or homicidal thoughts, also denying auditory or visual hallucinations.  She states that she knows she will never harm herself or anyone else, because she was raised in foster care and she would not want that for her child.  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  She states her mother has multiple sclerosis and congestive heart failure, so it is very hard to get assistance from her but her mother helps out.  She states that she does have a psychiatric diagnosis depression and  anxiety, states that she has been admitted to behavioral health Hospital when she was a child.  She states that she currently takes Wellbutrin , and Lamictal , but states she has not had her medications in a couple days due to running out, she states that her primary care physician prescribes her medications.  She states her appetite and sleep are good, states that today was just a tough day for her when she found out her boyfriend was cheating.  She currently denies any drug or alcohol, UDS is negative and BAL is less than 10.  She states that she received therapy weekly and has been going to this therapist for about a couple months, she goes to [facility] here in [place].    During evaluation Courtney  Zonzarae Holloway is just sitting on the edge of the bed and appears to be in moderate distress, as she states she has no one to look after her 23-year-old, states he lives with her boyfriend now, but knows the boyfriend does not want to watch. She is alert, oriented x 4, calm, cooperative and attentive. Her mood is sad with congruent affect. She has normal speech, and behavior.  Objectively there is no evidence of psychosis/mania or delusional thinking.  Patient is able to converse coherently, goal directed thoughts, no distractibility, or pre-occupation. She denies suicidal/self-harm/homicidal ideation, psychosis, and paranoia.  Patient answered question appropriately.   During encounter, patient denies depressive symptoms of insomnia, anhedonia, decreased interest in doing things that make her happy, poor concentration, poor appetite, psychomotor retardation, and decreased motivation levels. Patient states that she made an impulsive decision to call the police, but felt like she just needed some help in the moment, stating that she was very hurt, but has no intentions to hurt herself or anyone else.  She states that she has her child's primary caregiver, continues to say that she does not want  him raised in foster care like she was.  She states that she does need a psychiatric provider to help with medication management, and will follow up with her therapist once she is discharged.  Per chart review patient has been following up with the therapist for a while, [facility], does appear that patient utilizes resources in the community to help with her mental illness and wellbeing. Patient scored a 5 on the PHQ-9.  Patient is future oriented, as she is looking towards the future and graduating as a Engineer, civil (consulting).  She is also excited about being a mother, and know that she has to be around for her son.  Patient and her mother  Psych ROS:  Depression: Endorses sadness  Anxiety: Endorses due to not having her child with her Mania (lifetime and current): Denies Psychosis: (lifetime and current): Denies   Collateral information:  Contacted patient's mother Maresa Morash and she states that her daughter is a very good girl, states that she does have a history of depression and anxiety.  She confirmed that patient does have a therapist and follows up with her therapist regularly, she states that she is supportive of her daughter in any way that she needs.  She states that when patient and boyfriend get into an argument, patient has a hard time coping with that.  She confirmed that she does have congestive heart failure and multiple sclerosis, but states she helps her her grandson and daughter.  She states that she is staying over a friend's house, and the patient is discharged tomorrow she will be able to come and pick her up.  She states that she does not feel the patient is a danger to herself or anyone else, just feel that patient was that at the circumstances of what happened.  She states that patient has a lot going on for herself, such as being in school for nursing and a full-time job.  Review of Systems  Psychiatric/Behavioral:   Positive for depression.      Psychiatric and Social History  Psychiatric History:  Information collected from patient and mother  Prev Dx/Sx: Depression and anxiety Current Psych Provider: None Home Meds (current): Yes Previous Med Trials: Denies Therapy: Yes  Prior Psych Hospitalization: Yes Prior Self Harm: Yes Prior Violence: Denies  Family Psych History: Denies Family Hx suicide: Denies  Social History:  Developmental Hx: Deferred Educational Hx: Patient graduated high school Occupational Hx: Patient works as a Economist Hx: Denies Living Situation: Lives at home with her 66-year-old son Spiritual Hx: Yes Access to weapons/lethal means: Denies  Substance History Patient denies any drug or alcohol abuse, UDS and BAL are negative.  Exam Findings  Physical Exam:  Vital Signs:  Temp:  [98.9 F (37.2 C)] 98.9 F (37.2 C) (06/05 1519) Pulse Rate:  [93] 93 (06/05 1519) Resp:  [18] 18 (06/05 1519) BP: (104)/(68) 104/68 (06/05 1519) SpO2:  [  100 %] 100 % (06/05 1519) Weight:  [102 kg] 102 kg (06/05 1521) Blood pressure 104/68, pulse 93, temperature 98.9 F (37.2 C), temperature source Oral, resp. rate 18, height 5\' 1"  (1.549 m), weight 102 kg, SpO2 100%, not currently breastfeeding. Body mass index is 42.49 kg/m.  Physical Exam Vitals and nursing note reviewed. Exam conducted with a chaperone present.  Neurological:     Mental Status: She is alert.  Psychiatric:        Attention and Perception: Attention normal.        Mood and Affect: Affect is tearful.        Speech: Speech normal.        Behavior: Behavior is cooperative.        Thought Content: Thought content normal.        Cognition and Memory: Memory normal.        Judgment: Judgment normal.     Mental Status Exam: General Appearance: Casual  Orientation:  Full (Time, Place, and Person)  Memory:  Immediate;   Fair Remote;   Fair  Concentration:  Concentration: Good and Attention Span: Good  Recall:   Good  Attention  Poor  Eye Contact:  Good  Speech:  Clear and Coherent  Language:  Good  Volume:  Normal  Mood: Sad  Affect:  Appropriate and Tearful  Thought Process:  Coherent  Thought Content:  Logical  Suicidal Thoughts:  No  Homicidal Thoughts:  No  Judgement:  Intact  Insight:  Fair  Psychomotor Activity:  Normal  Akathisia:  NA  Fund of Knowledge:  Fair      Assets:  Manufacturing systems engineer Desire for Improvement Financial Resources/Insurance Housing Social Support  Cognition:  WNL  ADL's:  Intact  AIMS (if indicated):        Other History   These have been pulled in through the EMR, reviewed, and updated if appropriate.  Family History:  The patient's family history includes Alcohol abuse in her maternal grandmother; Bipolar disorder in her maternal grandmother and mother; Diabetes in her father and mother; Multiple sclerosis in her mother; Schizophrenia in her mother.  Medical History: Past Medical History:  Diagnosis Date   Anxiety    Asthma    Depression    Diabetes mellitus during pregnancy in second trimester 09/15/2022   DMDD (disruptive mood dysregulation disorder) (HCC) 12/28/2015   Failed vision screen 11/21/2014   GAD (generalized anxiety disorder)    Gestational diabetes    Headache(784.0)    HSV-1 (herpes simplex virus 1) infection 08/02/2020   Obesity 11/21/2014   Vision abnormalities    wears glasses    Surgical History: Past Surgical History:  Procedure Laterality Date   CESAREAN SECTION N/A 12/23/2022   Procedure: CESAREAN SECTION;  Surgeon: Malka Sea, DO;  Location: [facility] LD ORS;  Service: Obstetrics;  Laterality: N/A;   NO PAST SURGERIES       Medications:   Current Facility-Administered Medications:    acetaminophen  (TYLENOL ) tablet 650 mg, 650 mg, Oral, Q6H PRN, Motley-Mangrum, Cortnie Ringel A, PMHNP   buPROPion  (WELLBUTRIN  XL) 24 hr tablet 150 mg, 150 mg, Oral, Daily, Motley-Mangrum, Jaelie Aguilera A, PMHNP   hydrOXYzine  (ATARAX ) tablet  25 mg, 25 mg, Oral, TID PRN, Motley-Mangrum, Makinze Jani A, PMHNP   traZODone  (DESYREL ) tablet 50 mg, 50 mg, Oral, QHS PRN, Motley-Mangrum, Jahdiel Krol A, PMHNP  Current Outpatient Medications:    albuterol  (VENTOLIN  HFA) 108 (90 Base) MCG/ACT inhaler, Inhale 1-2 puffs into the lungs every 6 (six) hours as  needed for wheezing or shortness of breath. (Patient not taking: Reported on 03/10/2024), Disp: 1 each, Rfl: 0   buPROPion  (WELLBUTRIN  XL) 150 MG 24 hr tablet, Take 1 tablet (150 mg total) by mouth daily. (Patient not taking: Reported on 03/10/2024), Disp: 90 tablet, Rfl: 0   Clindamycin -Benzoyl Per, Refr, gel, Apply topically twice daily onto clean skin (Patient not taking: Reported on 03/10/2024), Disp: 45 g, Rfl: 0   etonogestrel  (NEXPLANON ) 68 MG IMPL implant, 1 each by Subdermal route once. (Patient not taking: Reported on 03/10/2024), Disp: , Rfl:    fluticasone  (FLONASE ) 50 MCG/ACT nasal spray, Place 2 sprays into both nostrils 2 (two) times daily. (Patient not taking: Reported on 03/10/2024), Disp: , Rfl:    lamoTRIgine  50 MG TBDP, Take 0.5 tablets (25 mg total) by mouth at bedtime for 7 days, THEN 0.5 tablets (25 mg total) 2 (two) times daily for 7 days, THEN 1 tablet (50 mg total) 2 (two) times daily. (Patient not taking: Reported on 03/10/2024), Disp: 60 tablet, Rfl: 2   promethazine  (PHENERGAN ) 12.5 MG tablet, Take 1 tablet (12.5 mg total) by mouth every 8 (eight) hours as needed for nausea or vomiting., Disp: 20 tablet, Rfl: 0   Semaglutide -Weight Management (WEGOVY ) 0.25 MG/0.5ML SOAJ, Inject 0.25 mg into the skin once a week. (Patient not taking: Reported on 03/10/2024), Disp: 2 mL, Rfl: 0   Semaglutide -Weight Management (WEGOVY ) 0.5 MG/0.5ML SOAJ, Inject 0.5 mg into the skin once a week. (Patient not taking: Reported on 03/10/2024), Disp: 2 mL, Rfl: 0   Semaglutide -Weight Management (WEGOVY ) 1 MG/0.5ML SOAJ, Inject 1 mg into the skin once a week. (Patient not taking: Reported on 03/10/2024), Disp: 2 mL,  Rfl: 0   valACYclovir  (VALTREX ) 1000 MG tablet, Take 1,000 mg by mouth 2 (two) times daily as needed. (Patient not taking: Reported on 03/10/2024), Disp: , Rfl:   Allergies: Allergies  Allergen Reactions   Other Anaphylaxis and Itching    Seasonal allergies, dog fur, certain detergents  Pt. Reports mother has used "epi-pen" for past allergies.    Pineapple Anaphylaxis   Fish-Derived Products Diarrhea, Hives and Itching   Zofran  [Ondansetron  Hcl] Itching    Stephanos Fan MOTLEY-MANGRUM, PMHNP

## 2024-05-19 NOTE — ED Notes (Signed)
 Pt's belongings, including a cell phone in a lavender case placed in [address].

## 2024-05-20 MED ORDER — BUPROPION HCL ER (XL) 150 MG PO TB24: 150.0000 mg | ORAL_TABLET | Freq: Every day | ORAL | 0 refills | Status: DC

## 2024-05-20 MED ORDER — HYDROXYZINE HCL 25 MG PO TABS: 25.0000 mg | ORAL_TABLET | Freq: Three times a day (TID) | ORAL | 0 refills | Status: DC | PRN

## 2024-05-20 MED ORDER — TRAZODONE HCL 50 MG PO TABS: 50.0000 mg | ORAL_TABLET | Freq: Every evening | ORAL | 0 refills | Status: DC | PRN

## 2024-05-20 NOTE — Consult Note (Signed)
 [facility] Psychiatric Consult Initial  Patient Name: .Courtney Holloway  MRN: [id number]  DOB: 28-Sep-2001  Consult Order details:  Orders (From admission, onward)     Start     Ordered   05/19/24 1534  CONSULT TO CALL ACT TEAM       Ordering Provider: Soto, Johana, PA-C  Provider:  (Not yet assigned)  Question:  Reason for Consult?  Answer:  depression   05/19/24 1534             Mode of Visit: In person    Psychiatry Consult Evaluation  Service Date: May 20, 2024 LOS:  LOS: 0 days  Chief Complaint suicidal thoughts  Primary Psychiatric Diagnoses  Major depressive disorder 2.  Generalized anxiety disorder  Assessment  Courtney  Zonzarae Holloway is a 23 y.o. female admitted: Presented to the ED on 05/19/2024  2:45 PM for suicidal thoughts with no plan or intent. She carries the psychiatric diagnoses of depression and anxiety and has a past medical history of prediabetes and anemia.   On evaluation today, the patient is sitting up on the side of her bed, watching television and appears to be in no acute distress, stating she is ready to go home. She is calm and cooperative during this assessment. Her appearance is appropriate for environment. Her eye contact is good.  Speech is clear and coherent, normal pace and normal volume. She is alert and oriented x3 to person, place, and situation. She reports her mood is "good".  Affect is congruent with mood.  Thought process is coherent. Thought content is within normal limits. She denies auditory and visual hallucinations.  No indication that she is responding to internal stimuli during this assessment.  No delusions elicited during this assessment. She denies suicidal ideations and homicidal ideations. She reports her appetite and sleep have been good since she has been here in the hospital.  She reports that she is ready to go home, to get back to her son. She also states she has to pick him up from school at 2p. During patient's stay  in the hospital, patient has been appropriate, compliant with medications and has not had any aggression or agitated behaviors since admission. Patient states she has a follow up  appointment with Nicolina Barrios, MS, LCMHC-A at [facility] on 05/25/2024. She states she wants help with managing anger, and she discuss how her boyfriend upsets her, and she becomes emotional, but it insightful enough to know that she needs to work on her anger and emotions for her 78-year-old son.  This provider discussed with her given her resources to [place], or PSI facilities that have groups that will help her find better ways of coping with anger.   Diagnoses:  Active Hospital problems: Principal Problem:   MDD (major depressive disorder), recurrent severe, without psychosis (HCC) Active Problems:   GAD (generalized anxiety disorder)    Plan   ## Psychiatric Medication Recommendations:  Continue patient's home medication Give Atarax  25 mg p.o. 3 times daily as needed for anxiety  ## Medical Decision Making Capacity: Not specifically addressed in this encounter  ## Further Work-up:  -- No further workup needed  EKG or UDS -- most recent EKG on 01/15/24 had QtC of 448 -- Pertinent labwork reviewed earlier this admission includes: CBC, CMP, EKG, UDS   ## Disposition:--Patient is psychiatrically cleared. Patient case review and discussed with Dr. Brenita Callow, and patient does not meet inpatient criteria for inpatient psychiatric treatment. At time of discharge, patient  denies SI, HI, AVH and can contract for safety. She demonstrated no overt evidence of psychosis or mania. Prior to discharge, she verbalized that they understood warning signs, triggers, and symptoms of worsening mental health and how to access emergency mental health care if they felt it was needed. Patient was instructed to call 911 or return to the emergency room if they experienced any concerning symptoms after discharge.  Patient voiced understanding and agreed to the above.  Patient given resources to follow up with behavioral health urgent care for therapy and medication management. Patient denies access to weapons. Safety planning completed.  Patient has a follow up appointment with Nicolina Barrios, MS, LCMHC-A at [facility] on 05/25/2024.   Safety Plan Mileidy  Zonzarae Skeet will reach out to her mother Abbigaile Rockman, call 911 or call mobile crisis, or go to nearest emergency room if condition worsens or if suicidal thoughts become active Patients' will follow up with [facility] for outpatient psychiatric services (therapy/medication management).  The suicide prevention education provided includes the following: Suicide risk factors Suicide prevention and interventions National Suicide Hotline telephone number [name] assessment telephone number [facility] Emergency Assistance 911 [facility] and/or Residential Mobile Crisis Unit telephone number Request made of family/significant other to:  mother Zynia Wojtowicz weapons (e.g., guns, rifles, knives), all items previously/currently identified as safety concern.   Remove drugs/medications (over the counter, prescriptions, illicit drugs), all items previously/currently identified as a safety concern.      ## Behavioral / Environmental: -To minimize splitting of staff, assign one staff person to communicate all information from the team when feasible. or Utilize compassion and acknowledge the patient's experiences while setting clear and realistic expectations for care.    ## Safety and Observation Level:  - Based on my clinical evaluation, I estimate the patient to be at low risk of self harm in the current setting. - At this time, we recommend  routine. This decision is based on my review of the chart including patient's history and current presentation, interview of the patient, mental status  examination, and consideration of suicide risk including evaluating suicidal ideation, plan, intent, suicidal or self-harm behaviors, risk factors, and protective factors. This judgment is based on our ability to directly address suicide risk, implement suicide prevention strategies, and develop a safety plan while the patient is in the clinical setting. Please contact our team if there is a concern that risk level has changed.  CSSR Risk Category:C-SSRS RISK CATEGORY: No Risk  Suicide Risk Assessment: Patient has following modifiable risk factors for suicide: under treated depression ,  we are recommending that patient be psychiatrically cleared and given outpatient resources for medication management and therapeutic services. Patient has following non-modifiable or demographic risk factors for suicide: history of self harm behavior and psychiatric hospitalization Patient has the following protective factors against suicide: Access to outpatient mental health care, Supportive family, and Minor children in the home  Thank you for this consult request. Recommendations have been communicated to the primary team.  We are recommending that patient be psychiatrically cleared and given outpatient resources for medication management and therapeutic services.  Chandra Come, PMHNP       History of Present Illness  Relevant Aspects of Hospital ED Course:  Admitted on 05/19/2024 for depression.  Patient Report: On evaluation today, the patient is sitting up on the side of her bed, watching television and appears to be in no acute distress, stating she is ready to go home. She  is calm and cooperative during this assessment. Her appearance is appropriate for environment. Her eye contact is good.  Speech is clear and coherent, normal pace and normal volume. She is alert and oriented x3 to person, place, and situation. She reports her mood is "good".  Affect is congruent with mood.  Thought process is  coherent. Thought content is within normal limits. She denies auditory and visual hallucinations.  No indication that she is responding to internal stimuli during this assessment.  No delusions elicited during this assessment. She denies suicidal ideations and homicidal ideations. She reports her appetite and sleep have been good since she has been here in the hospital.  She reports that she is ready to go home, to get back to her son. She also states she has to pick him up from school at 2p. During patient's stay in the hospital, patient has been appropriate, compliant with medications and has not had any aggression or agitated behaviors since admission. Patient states she has a follow up  appointment with Nicolina Barrios, MS, LCMHC-A at [facility] on 05/25/2024. She states she wants help with managing anger, and she discuss how her boyfriend upsets her, and she becomes emotional, but it insightful enough to know that she needs to work on her anger and emotions for her 6-year-old son.  This provider discussed with her given her resources to [place], or PSI facilities that have groups that will help her find better ways of coping with anger.  Psych ROS:  Depression: Endorses sadness  Anxiety: Endorses due to not having her child with her Mania (lifetime and current): Denies Psychosis: (lifetime and current): Denies   Collateral information:  Contacted patient's mother Tmya Wigington continues to feel that patient is not a danger to self or anyone else, feels that patient will be fine with coming home.  She states that once patient is discharged, she will be at her home with her 18-year-old son.  She states that she will be able to monitor patient over the weekend, this provider was able to safety plan with patient's mother.  Discussed with patient's mother additional resources to help patient manage her anger, gave her information on [facility] and psychotherapeutic interventions so that  patient can attend groups on coping skills.     Review of Systems  Psychiatric/Behavioral:  Positive for depression.      Psychiatric and Social History  Psychiatric History:  Information collected from patient and mother  Prev Dx/Sx: Depression and anxiety Current Psych Provider: None Home Meds (current): Yes Previous Med Trials: Denies Therapy: Yes  Prior Psych Hospitalization: Yes Prior Self Harm: Yes Prior Violence: Denies  Family Psych History: Denies Family Hx suicide: Denies  Social History:  Developmental Hx: Deferred Educational Hx: Patient graduated high school Occupational Hx: Patient works as a Economist Hx: Denies Living Situation: Lives at home with her 31-year-old son Spiritual Hx: Yes Access to weapons/lethal means: Denies  Substance History Patient denies any drug or alcohol abuse, UDS and BAL are negative.  Exam Findings  Physical Exam:  Vital Signs:  Temp:  [98.2 F (36.8 C)-98.9 F (37.2 C)] 98.2 F (36.8 C) (06/06 1037) Pulse Rate:  [65-93] 71 (06/06 1037) Resp:  [18] 18 (06/06 1037) BP: (103-144)/(62-88) 144/88 (06/06 1037) SpO2:  [100 %] 100 % (06/06 1037) Weight:  [102 kg] 102 kg (06/05 1521) Blood pressure (!) 144/88, pulse 71, temperature 98.2 F (36.8 C), resp. rate 18, height 5\' 1"  (1.549 m), weight 102 kg, SpO2 100%,  not currently breastfeeding. Body mass index is 42.49 kg/m.  Physical Exam Vitals and nursing note reviewed. Exam conducted with a chaperone present.  Neurological:     Mental Status: She is alert.  Psychiatric:        Attention and Perception: Attention normal.        Mood and Affect: Affect is tearful.        Speech: Speech normal.        Behavior: Behavior is cooperative.        Thought Content: Thought content normal.        Cognition and Memory: Memory normal.        Judgment: Judgment normal.     Mental Status Exam: General Appearance: Casual  Orientation:  Full (Time, Place, and Person)  Memory:   Immediate;   Fair Remote;   Fair  Concentration:  Concentration: Good and Attention Span: Good  Recall:  Good  Attention  Poor  Eye Contact:  Good  Speech:  Clear and Coherent  Language:  Good  Volume:  Normal  Mood: Sad  Affect:  Appropriate and Tearful  Thought Process:  Coherent  Thought Content:  Logical  Suicidal Thoughts:  No  Homicidal Thoughts:  No  Judgement:  Intact  Insight:  Fair  Psychomotor Activity:  Normal  Akathisia:  NA  Fund of Knowledge:  Fair      Assets:  Manufacturing systems engineer Desire for Improvement Financial Resources/Insurance Housing Social Support  Cognition:  WNL  ADL's:  Intact  AIMS (if indicated):        Other History   These have been pulled in through the EMR, reviewed, and updated if appropriate.  Family History:  The patient's family history includes Alcohol abuse in her maternal grandmother; Bipolar disorder in her maternal grandmother and mother; Diabetes in her father and mother; Multiple sclerosis in her mother; Schizophrenia in her mother.  Medical History: Past Medical History:  Diagnosis Date   Anxiety    Asthma    Depression    Diabetes mellitus during pregnancy in second trimester 09/15/2022   DMDD (disruptive mood dysregulation disorder) (HCC) 12/28/2015   Failed vision screen 11/21/2014   GAD (generalized anxiety disorder)    Gestational diabetes    Headache(784.0)    HSV-1 (herpes simplex virus 1) infection 08/02/2020   Obesity 11/21/2014   Vision abnormalities    wears glasses    Surgical History: Past Surgical History:  Procedure Laterality Date   CESAREAN SECTION N/A 12/23/2022   Procedure: CESAREAN SECTION;  Surgeon: Malka Sea, DO;  Location: [facility] LD ORS;  Service: Obstetrics;  Laterality: N/A;   NO PAST SURGERIES       Medications:   Current Facility-Administered Medications:    acetaminophen  (TYLENOL ) tablet 650 mg, 650 mg, Oral, Q6H PRN, Motley-Mangrum, Shemia Bevel A, PMHNP, 650 mg at 05/20/24 0946    buPROPion  (WELLBUTRIN  XL) 24 hr tablet 150 mg, 150 mg, Oral, Daily, Motley-Mangrum, Jamear Carbonneau A, PMHNP, 150 mg at 05/20/24 0946   hydrOXYzine  (ATARAX ) tablet 25 mg, 25 mg, Oral, TID PRN, Motley-Mangrum, Izumi Mixon A, PMHNP, 25 mg at 05/19/24 1816   traZODone  (DESYREL ) tablet 50 mg, 50 mg, Oral, QHS PRN, Motley-Mangrum, Freyja Govea A, PMHNP, 50 mg at 05/19/24 1819  Current Outpatient Medications:    albuterol  (VENTOLIN  HFA) 108 (90 Base) MCG/ACT inhaler, Inhale 1-2 puffs into the lungs every 6 (six) hours as needed for wheezing or shortness of breath., Disp: 1 each, Rfl: 0   CLARITIN  10 MG tablet, Take 10  mg by mouth daily., Disp: , Rfl:    etonogestrel  (NEXPLANON ) 68 MG IMPL implant, 1 each by Subdermal route once., Disp: , Rfl:    fluticasone  (FLONASE ) 50 MCG/ACT nasal spray, Place 2 sprays into both nostrils 2 (two) times daily as needed for allergies or rhinitis., Disp: , Rfl:    lamoTRIgine  50 MG TBDP, Take 0.5 tablets (25 mg total) by mouth at bedtime for 7 days, THEN 0.5 tablets (25 mg total) 2 (two) times daily for 7 days, THEN 1 tablet (50 mg total) 2 (two) times daily. (Patient taking differently: Take 100 mg by mouth once a day), Disp: 60 tablet, Rfl: 2   promethazine  (PHENERGAN ) 12.5 MG tablet, Take 1 tablet (12.5 mg total) by mouth every 8 (eight) hours as needed for nausea or vomiting., Disp: 20 tablet, Rfl: 0   Semaglutide -Weight Management (WEGOVY ) 1 MG/0.5ML SOAJ, Inject 1 mg into the skin once a week. (Patient taking differently: Inject 1 mg into the skin every Thursday.), Disp: 2 mL, Rfl: 0   valACYclovir  (VALTREX ) 1000 MG tablet, Take 1,000 mg by mouth 2 (two) times daily as needed (for fever blisters)., Disp: , Rfl:    buPROPion  (WELLBUTRIN  XL) 150 MG 24 hr tablet, Take 1 tablet (150 mg total) by mouth daily. (Patient not taking: Reported on 05/19/2024), Disp: 90 tablet, Rfl: 0   Clindamycin -Benzoyl Per, Refr, gel, Apply topically twice daily onto clean skin (Patient not taking: Reported on  05/19/2024), Disp: 45 g, Rfl: 0   Semaglutide -Weight Management (WEGOVY ) 0.25 MG/0.5ML SOAJ, Inject 0.25 mg into the skin once a week. (Patient not taking: Reported on 05/19/2024), Disp: 2 mL, Rfl: 0   Semaglutide -Weight Management (WEGOVY ) 0.5 MG/0.5ML SOAJ, Inject 0.5 mg into the skin once a week. (Patient not taking: Reported on 05/19/2024), Disp: 2 mL, Rfl: 0  Allergies: Allergies  Allergen Reactions   Other Anaphylaxis and Itching    Seasonal allergies, dog fur, certain detergents  Pt. Reports mother has used "epi-pen" for past allergies.    Pineapple Anaphylaxis   Shellfish-Derived Products Anaphylaxis and Other (See Comments)   Fish-Derived Products Hives, Diarrhea and Itching   Zofran  [Ondansetron  Hcl] Itching    Dom Haverland MOTLEY-MANGRUM, PMHNP

## 2024-05-20 NOTE — Discharge Instructions (Addendum)
 Please follow up with your therapist at [facility] on 05/25/2024  Please follow up with group therapy at [facility]   Discharge recommendations:  Patient is to take medications as prescribed. Please see information for follow-up appointment with psychiatry and therapy. Please follow up with your primary care provider for all medical related needs.   Therapy: We recommend that patient participate in individual therapy to address mental health concerns.  Medications: The patient or guardian is to contact a medical professional and/or outpatient provider to address any new side effects that develop. The patient or guardian should update outpatient providers of any new medications and/or medication changes.   Atypical antipsychotics: If you are prescribed an atypical antipsychotic, it is recommended that your height, weight, BMI, blood pressure, fasting lipid panel, and fasting blood sugar be monitored by your outpatient providers.  Safety:  The patient should abstain from use of illicit substances/drugs and abuse of any medications. If symptoms worsen or do not continue to improve or if the patient becomes actively suicidal or homicidal then it is recommended that the patient return to the closest hospital emergency department, the [facility], or call 911 for further evaluation and treatment. National Suicide Prevention Lifeline 1-800-SUICIDE or 252-620-6102.  About 988 988 offers 24/7 access to trained crisis counselors who can help people experiencing mental health-related distress. People can call or text 988 or chat 988lifeline.org for themselves or if they are worried about a loved one who may need crisis support.  Crisis Mobile: [facility]:                     571-425-9199 (for crisis response 24 hours a day) [facility] Hotline:                                            (405)633-2794    Safety Plan Sherlonda  Zonzarae  Campa will reach out to her mother Paulette Rockford, call 911 or call mobile crisis, or go to nearest emergency room if condition worsens or if suicidal thoughts become active Patients' will follow up with [facility] for outpatient psychiatric services (therapy/medication management).  The suicide prevention education provided includes the following: Suicide risk factors Suicide prevention and interventions National Suicide Hotline telephone number [facility] assessment telephone number [facility] Emergency Assistance 911 [facility] and/or Residential Mobile Crisis Unit telephone number Request made of family/significant other to:  mother Anayiah Howden weapons (e.g., guns, rifles, knives), all items previously/currently identified as safety concern.   Remove drugs/medications (over the counter, prescriptions, illicit drugs), all items previously/currently identified as a safety concern.

## 2024-05-20 NOTE — ED Notes (Signed)
 Pt on the phone upset " maybe if he wasn't being at [employer] being a whore maybe all this wouldn't have happened"   " He told everyone I'm at mental health , I'm at damn [place]" " He needs to tell people the truth, why would you send my son to school smelling like that"

## 2024-05-23 ENCOUNTER — Encounter: Payer: Self-pay | Admitting: Obstetrics and Gynecology

## 2024-05-23 ENCOUNTER — Ambulatory Visit (INDEPENDENT_AMBULATORY_CARE_PROVIDER_SITE_OTHER): Admitting: Obstetrics and Gynecology

## 2024-05-23 VITALS — BP 118/76 | HR 90 | Ht 62.0 in | Wt 240.0 lb

## 2024-05-23 DIAGNOSIS — Z3046 Encounter for surveillance of implantable subdermal contraceptive: Secondary | ICD-10-CM | POA: Diagnosis not present

## 2024-05-23 DIAGNOSIS — N938 Other specified abnormal uterine and vaginal bleeding: Secondary | ICD-10-CM | POA: Diagnosis not present

## 2024-05-23 DIAGNOSIS — N930 Postcoital and contact bleeding: Secondary | ICD-10-CM

## 2024-05-23 MED ORDER — DROSPIRENONE-ETHINYL ESTRADIOL 3-0.02 MG PO TABS: 1.0000 | ORAL_TABLET | Freq: Every day | ORAL | 6 refills | Status: DC

## 2024-05-23 NOTE — Progress Notes (Signed)
   Acute Office Visit  Subjective:    Patient ID: Courtney  Layton Holloway, female    DOB: 09/29/2001, 22 y.o.   MRN: [id number]   HPI 23 y.o. presents today for Establish Care (Nexplanon  inserted in 2024/Gardasil 2/3-unsure if received the third. ) and GYN Problem (Pt reports consistent post coital bleeding and heavy as if having a period w/ clotting and can last for days afterwards for the past two months. ) . Same partner History of anemia Recent hospitalization for depression No LMP recorded (lmp unknown). Patient has had an implant.    Review of Systems     Objective:     OBGyn Exam  BP 118/76   Pulse 90   Ht 5\' 2"  (1.575 m)   Wt 240 lb (108.9 kg)   LMP  (LMP Unknown)   SpO2 96%   BMI 43.90 kg/m  Wt Readings from Last 3 Encounters:  05/23/24 240 lb (108.9 kg)  05/19/24 224 lb 13.9 oz (102 kg)  04/24/24 225 lb (102.1 kg)        SVE: spotting seen, normal discharge. Nuswab collected Left labia with small boil patient reports from shaving  Assessment & Plan:  DUB, post coital bleeding RTC for PUS, removal of nexplanon  To begin yaz today. Take on continuous basis with skipping placebo pills and starting new pack. Remove nexplanon  in a week or so. Discussed removing the nexplanon  may improve depression and continuous use with YAZ has the PMDD indication and better cycle control.  She agreed September she will begin her pap smear screening   Reinaldo Caras

## 2024-05-25 ENCOUNTER — Encounter: Payer: Self-pay | Admitting: Oncology

## 2024-05-25 ENCOUNTER — Ambulatory Visit: Payer: Self-pay | Admitting: Obstetrics and Gynecology

## 2024-05-25 LAB — SURESWAB® ADVANCED VAGINITIS PLUS,TMA
C. trachomatis RNA, TMA: NOT DETECTED
CANDIDA SPECIES: NOT DETECTED
Candida glabrata: NOT DETECTED
N. gonorrhoeae RNA, TMA: NOT DETECTED
SURESWAB(R) ADV BACTERIAL VAGINOSIS(BV),TMA: NEGATIVE
TRICHOMONAS VAGINALIS (TV),TMA: NOT DETECTED

## 2024-05-29 ENCOUNTER — Encounter: Payer: Self-pay | Admitting: Family Medicine

## 2024-05-30 MED ORDER — WEGOVY 1.7 MG/0.75ML ~~LOC~~ SOAJ: 1.7000 mg | SUBCUTANEOUS | 0 refills | Status: DC

## 2024-06-04 ENCOUNTER — Encounter (HOSPITAL_COMMUNITY): Payer: Self-pay | Admitting: *Deleted

## 2024-06-04 ENCOUNTER — Emergency Department (HOSPITAL_COMMUNITY)
Admission: EM | Admit: 2024-06-04 | Discharge: 2024-06-04 | Disposition: A | Discharge status: Home / Self Care | Attending: Emergency Medicine | Admitting: Emergency Medicine

## 2024-06-04 ENCOUNTER — Other Ambulatory Visit: Payer: Self-pay

## 2024-06-04 DIAGNOSIS — L0201 Cutaneous abscess of face: Secondary | ICD-10-CM | POA: Insufficient documentation

## 2024-06-04 DIAGNOSIS — R220 Localized swelling, mass and lump, head: Secondary | ICD-10-CM | POA: Diagnosis present

## 2024-06-04 MED ORDER — HYDROCODONE-ACETAMINOPHEN 5-325 MG PO TABS: 2.0000 | ORAL_TABLET | ORAL | 0 refills | Status: DC | PRN

## 2024-06-04 NOTE — ED Provider Notes (Signed)
 [place] EMERGENCY DEPARTMENT AT [facility] Provider Note   CSN: [id number] Arrival date & time: 06/04/24  9198     Patient presents with: Abscess   Courtney Holloway is a 23 y.o. female.   Patient reports increased right sided facial swelling. Diagnosed with abscess yesterday at [facility] and was started on antibiotics. Is concerned because the swelling is worse today. No fever. No nausea.   The history is provided by the patient. No language interpreter was used.  Abscess      Prior to Admission medications   Medication Sig Start Date End Date Taking? Authorizing Provider  HYDROcodone -acetaminophen  (NORCO/VICODIN) 5-325 MG tablet Take 2 tablets by mouth every 4 (four) hours as needed. 06/04/24  Yes Arneta Mahmood, Margit, PA-C  buPROPion  (WELLBUTRIN  XL) 150 MG 24 hr tablet Take 1 tablet (150 mg total) by mouth daily. 05/20/24   Motley-Mangrum, Jadeka A, PMHNP  CLARITIN  10 MG tablet Take 10 mg by mouth daily.    [provider]  drospirenone -ethinyl estradiol  (YAZ) 3-0.02 MG tablet Take 1 tablet by mouth daily. Take on continuous basis with skipping placebo pills and starting new pack. To begin now. Remove nexplanon  in a week or so 05/23/24   Glennon Almarie POUR, MD  fluticasone  (FLONASE ) 50 MCG/ACT nasal spray Place 2 sprays into both nostrils 2 (two) times daily as needed for allergies or rhinitis. 11/09/23 11/08/24  [provider]  hydrOXYzine  (ATARAX ) 25 MG tablet Take 1 tablet (25 mg total) by mouth 3 (three) times daily as needed for anxiety. Patient not taking: Reported on 05/23/2024 05/20/24   Motley-Mangrum, Jadeka A, PMHNP  Semaglutide -Weight Management (WEGOVY ) 1.7 MG/0.75ML SOAJ Inject 1.7 mg into the skin once a week. 05/30/24   Ozell Heron HERO, MD  traZODone  (DESYREL ) 50 MG tablet Take 1 tablet (50 mg total) by mouth at bedtime as needed for sleep. Patient not taking: Reported on 05/23/2024 05/20/24   Motley-Mangrum, Jadeka A, PMHNP     Allergies: Other, Pineapple, Shellfish-derived products, Fish-derived products, and Zofran  [ondansetron  hcl]    Review of Systems  Updated Vital Signs BP (!) 132/93 (BP Location: Left Arm)   Pulse 96   Temp 98 F (36.7 C) (Oral)   Resp 18   Ht 5' 2 (1.575 m)   Wt 108.9 kg   SpO2 98%   BMI 43.90 kg/m   Physical Exam Vitals and nursing note reviewed.  Constitutional:      General: She is not in acute distress.    Appearance: She is well-developed. She is not ill-appearing.  HENT:     Head:     Comments: Generally good dentition. No visualized intraoral abscess. Area of lower right cheek indurated c/w infection.  Pulmonary:     Effort: Pulmonary effort is normal.   Musculoskeletal:        General: Normal range of motion.     Cervical back: Normal range of motion.   Skin:    General: Skin is warm and dry.   Neurological:     Mental Status: She is alert and oriented to person, place, and time.     (all labs ordered are listed, but only abnormal results are displayed) Labs Reviewed - No data to display  EKG: None  Radiology: No results found.   Procedures   Medications Ordered in the ED - No data to display  Clinical Course as of 06/04/24 0858  Sat Jun 04, 2024  9152 She is reassured that the antibiotic has not  had time to be effective. Continue abx. Will provide pain medication for comfort. Discussed return precautions.  [SU]    Clinical Course User Index [SU] Odell Balls, PA-C                                 Medical Decision Making       Final diagnoses:  Facial abscess    ED Discharge Orders          Ordered    HYDROcodone -acetaminophen  (NORCO/VICODIN) 5-325 MG tablet  Every 4 hours PRN        06/04/24 0852               Odell Balls, PA-C 06/04/24 0858    Elnor Jayson LABOR, DO 06/05/24 [phone]

## 2024-06-04 NOTE — ED Triage Notes (Signed)
 Pt states she has an abscess bottom rt jaw. She was seen at [facility] yesterday and started on Antibiotics, this morning still painful with swollen jaw/face.

## 2024-06-04 NOTE — Discharge Instructions (Addendum)
 Continue the antibiotic as prescribed. You can take Norco for pain as needed. Please call your doctor and make an appointment for recheck in one week.   Give the antibiotic 48 hours to become effective. If, after that, you develop any fever or worsening symptoms, return to the emergency department for reassesement.

## 2024-06-09 ENCOUNTER — Other Ambulatory Visit: Payer: Self-pay | Admitting: Family Medicine

## 2024-06-09 DIAGNOSIS — F902 Attention-deficit hyperactivity disorder, combined type: Secondary | ICD-10-CM

## 2024-06-09 DIAGNOSIS — F332 Major depressive disorder, recurrent severe without psychotic features: Secondary | ICD-10-CM

## 2024-06-17 ENCOUNTER — Encounter (HOSPITAL_BASED_OUTPATIENT_CLINIC_OR_DEPARTMENT_OTHER): Payer: Self-pay | Admitting: Emergency Medicine

## 2024-06-17 ENCOUNTER — Emergency Department (HOSPITAL_BASED_OUTPATIENT_CLINIC_OR_DEPARTMENT_OTHER)
Admission: EM | Admit: 2024-06-17 | Discharge: 2024-06-17 | Disposition: A | Discharge status: Home / Self Care | Payer: Worker's Compensation | Attending: Emergency Medicine | Admitting: Emergency Medicine

## 2024-06-17 ENCOUNTER — Ambulatory Visit (HOSPITAL_COMMUNITY)
Admission: EM | Admit: 2024-06-17 | Discharge: 2024-06-17 | Disposition: A | Discharge status: Home / Self Care | Payer: Worker's Compensation

## 2024-06-17 ENCOUNTER — Encounter (HOSPITAL_COMMUNITY): Payer: Self-pay

## 2024-06-17 ENCOUNTER — Other Ambulatory Visit: Payer: Self-pay

## 2024-06-17 ENCOUNTER — Encounter: Payer: Self-pay | Admitting: Oncology

## 2024-06-17 DIAGNOSIS — Z77098 Contact with and (suspected) exposure to other hazardous, chiefly nonmedicinal, chemicals: Secondary | ICD-10-CM | POA: Diagnosis not present

## 2024-06-17 DIAGNOSIS — H5789 Other specified disorders of eye and adnexa: Secondary | ICD-10-CM | POA: Insufficient documentation

## 2024-06-17 DIAGNOSIS — J392 Other diseases of pharynx: Secondary | ICD-10-CM

## 2024-06-17 DIAGNOSIS — T6591XA Toxic effect of unspecified substance, accidental (unintentional), initial encounter: Secondary | ICD-10-CM | POA: Insufficient documentation

## 2024-06-17 DIAGNOSIS — Z794 Long term (current) use of insulin: Secondary | ICD-10-CM | POA: Diagnosis not present

## 2024-06-17 MED ORDER — TETRACAINE HCL 0.5 % OP SOLN
OPHTHALMIC | Status: AC
  Filled 2024-06-17: qty 4

## 2024-06-17 MED ORDER — FLUORESCEIN SODIUM 1 MG OP STRP
ORAL_STRIP | OPHTHALMIC | Status: AC
  Filled 2024-06-17: qty 1

## 2024-06-17 MED ORDER — EYE WASH OP SOLN
OPHTHALMIC | Status: AC
  Filled 2024-06-17: qty 118

## 2024-06-17 MED ORDER — TETRACAINE HCL 0.5 % OP SOLN
2.0000 [drp] | Freq: Once | OPHTHALMIC | Status: AC
  Administered 2024-06-17: 2 [drp] via OPHTHALMIC
  Filled 2024-06-17: qty 4

## 2024-06-17 MED ORDER — FLUORESCEIN SODIUM 1 MG OP STRP
1.0000 | ORAL_STRIP | Freq: Once | OPHTHALMIC | Status: AC
  Administered 2024-06-17: 1 via OPHTHALMIC
  Filled 2024-06-17: qty 1

## 2024-06-17 NOTE — ED Notes (Addendum)
 Patient declined EMS services, states there is no one to pick her up and that she is going to pick up her son and then go to the ED.
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
## 2024-06-17 NOTE — ED Notes (Signed)
 Pt ambulated to the bathroom.

## 2024-06-17 NOTE — ED Notes (Signed)
 Patient is being discharged from the Urgent Care and sent to the Emergency Department via POV . Per RONAL Belt NP, patient is in need of higher level of care due to need for further evaluation. Patient is aware and verbalizes understanding of plan of care.  Vitals:   06/17/24 1517  BP: 112/78  Pulse: 84  Resp: 18  Temp: 98.7 F (37.1 C)  SpO2: 98%

## 2024-06-17 NOTE — Discharge Instructions (Signed)
Please go to ER for further care.
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
## 2024-06-17 NOTE — Discharge Instructions (Addendum)
 You may apply lubricating eyedrops to both eyes as needed for eye irritation.  You can get eyedrops over-the-counter.  Please follow-up with the ophthalmologist (eye doctor) listed below within the next 5 days for reevaluation.  You will need to call to schedule this appointment.  Please return to the emergency room if you develop any worsening vision, worsening eye pain, any other new or concerning symptoms
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
## 2024-06-17 NOTE — ED Notes (Signed)
 Right and left eyes were irrigated with eye wash.

## 2024-06-21 ENCOUNTER — Ambulatory Visit (INDEPENDENT_AMBULATORY_CARE_PROVIDER_SITE_OTHER): Admitting: Allergy & Immunology

## 2024-06-21 DIAGNOSIS — J4520 Mild intermittent asthma, uncomplicated: Secondary | ICD-10-CM

## 2024-06-21 DIAGNOSIS — L2089 Other atopic dermatitis: Secondary | ICD-10-CM

## 2024-06-21 DIAGNOSIS — J3089 Other allergic rhinitis: Secondary | ICD-10-CM

## 2024-06-21 DIAGNOSIS — J302 Other seasonal allergic rhinitis: Secondary | ICD-10-CM

## 2024-06-21 NOTE — Progress Notes (Unsigned)
 FOLLOW UP  Date of Service/Encounter:  06/21/24   Assessment:   No diagnosis found.  Plan/Recommendations:   There are no Patient Instructions on file for this visit.   Subjective:   Courtney  Zonzarae Holloway is a 23 y.o. female presenting today for follow up of No chief complaint on file.   Courtney  Zonzarae Holloway has a history of the following: Patient Active Problem List   Diagnosis Date Noted   Dysfunctional uterine bleeding 04/13/2023   Postpartum anemia 04/13/2023   Deficiency anemia 04/03/2023   Status post primary low transverse cesarean section 12/23/2022   BMI 50.0-59.9, adult (HCC) 12/04/2022   HSV (herpes simplex virus) anogenital infection 11/17/2022   Suicidal ideation 10/31/2022   GBS bacteriuria 10/06/2022   Morbid obesity (HCC) 07/16/2022   Elevated blood pressure reading without diagnosis of hypertension 01/13/2022   Body mass index (BMI) of 39.0 to 39.9 in adult 09/02/2021   Prediabetes 08/04/2021   Nightmares associated with chronic post-traumatic stress disorder 04/01/2021   Anal fissure 03/28/2021   Drug overdose, multiple drugs, undetermined intent, initial encounter 02/02/2021   Attention deficit hyperactivity disorder (ADHD), combined type 01/28/2021   MDD (major depressive disorder), recurrent severe, without psychosis (HCC) 01/16/2021   Dyslipidemia 02/15/2018   Hidradenitis axillaris 07/11/2016   Slow transit constipation 11/21/2014   Asthma 05/04/2013   GAD (generalized anxiety disorder) 11/02/2012   Post-traumatic stress disorder 11/02/2012    History obtained from: chart review and {Persons; PED relatives w/patient:19415::patient}.  Discussed the use of AI scribe software for clinical note transcription with the patient and/or guardian, who gave verbal consent to proceed.  Courtney Holloway  is a 23 y.o. female presenting for skin testing. She was last seen on March 27th. We could not do testing because her insurance company does not cover  testing on the same day as a New Patient visit. She has been off of all antihistamines 3 days in anticipation of the testing.   At the last visit, we continue with antihistamine for environmental allergies.  She was concerned with food allergies.  We obtained an alpha gal panel.  We obtained a tryptase.  We gave her a food sheet at the last visit.  She continue to avoid all of her triggering chemicals for her contact dermatitis.  She also continue with moisturizing.  Lung testing looked great.  Otherwise, there have been no changes to her past medical history, surgical history, family history, or social history.    Review of systems otherwise negative other than that mentioned in the HPI.    Objective:   not currently breastfeeding. There is no height or weight on file to calculate BMI.    Physical exam deferred since this was a skin testing appointment only.   Diagnostic studies: {Blank single:19197::none,deferred due to recent antihistamine use,labs sent instead, }  Spirometry: {Blank single:19197::results normal (FEV1: ***%, FVC: ***%, FEV1/FVC: ***%),results abnormal (FEV1: ***%, FVC: ***%, FEV1/FVC: ***%)}.    {Blank single:19197::Spirometry consistent with mild obstructive disease,Spirometry consistent with moderate obstructive disease,Spirometry consistent with severe obstructive disease,Spirometry consistent with possible restrictive disease,Spirometry consistent with mixed obstructive and restrictive disease,Spirometry uninterpretable due to technique,Spirometry consistent with normal pattern}. {Blank single:19197::Albuterol /Atrovent nebulizer,Xopenex/Atrovent nebulizer,Albuterol  nebulizer,Albuterol  four puffs via MDI,Xopenex four puffs via MDI} treatment given in clinic with {Blank single:19197::significant improvement in FEV1 per ATS criteria,significant improvement in FVC per ATS criteria,significant improvement in FEV1 and FVC per ATS  criteria,improvement in FEV1, but not significant per ATS criteria,improvement in FVC, but not significant per ATS criteria,improvement in FEV1 and  FVC, but not significant per ATS criteria,no improvement}.  Allergy Studies: {Blank single:19197::none,labs sent instead, }    {Blank single:19197::Allergy testing results were read and interpreted by myself, documented by clinical staff., }      Marty Shaggy, MD  [facility] 

## 2024-06-21 NOTE — Patient Instructions (Addendum)
 1. Mild intermittent asthma, uncomplicated - Lung testing not done today. - Continue with albuterol  as needed.   2. Seasonal and perennial allergic rhinitis - Testing today showed: grasses, weeds, trees, indoor molds, outdoor molds, dust mites, cat, dog, and cockroach - Copy of test results provided.  - Avoidance measures provided. - Continue with: Zyrtec  (cetirizine ) 10mg  tablet TWICE daily - You can use an extra dose of the antihistamine, if needed, for breakthrough symptoms.  - Consider nasal saline rinses 1-2 times daily to remove allergens from the nasal cavities as well as help with mucous clearance (this is especially helpful to do before the nasal sprays are given) - We will start allergy shots as a means of long-term control. - Allergy shots re-train and reset the immune system to ignore environmental allergens and decrease the resulting immune response to those allergens (sneezing, itchy watery eyes, runny nose, nasal congestion, etc).    - Allergy shots improve symptoms in 75-85% of patients.   3. Concern for food allergies (stomach pain and nausea) - Alpha gal panel was negative, ruling out a red meat allergy. - Tryptase was normal, ruling out a mast cell disease. - Testing to the most common foods was negative, ruling out more than 95% of all food allergies. - Testing to the pineapple and orange were negative as well.   3. Allergic contact dermatitis  - Continue to avoid all of your triggering chemicals.  4. Flexural atopic dermatitis -  Continue with moisturizing as you are doing.   5. Return in about 3 months (around 09/21/2024). You can have the follow up appointment with Dr. Iva or a Nurse Practicioner (our Nurse Practitioners are excellent and always have Physician oversight!).    Please inform us  of any Emergency Department visits, hospitalizations, or changes in symptoms. Call us  before going to the ED for breathing or allergy symptoms since we might be able  to fit you in for a sick visit. Feel free to contact us  anytime with any questions, problems, or concerns.  It was a pleasure to see you again today!  Websites that have reliable patient information: 1. American Academy of Asthma, Allergy, and Immunology: www.aaaai.org 2. Food Allergy Research and Education (FARE): foodallergy.org 3. Mothers of Asthmatics: http://www.asthmacommunitynetwork.org 4. American College of Allergy, Asthma, and Immunology: www.acaai.org      "Like" us  on Facebook and Instagram for our latest updates!      A healthy democracy works best when [employer] participate! Make sure you are registered to vote! If you have moved or changed any of your contact information, you will need to get this updated before voting! Scan the QR codes below to learn more!        Airborne Adult Perc - 06/21/24 1800     Time Antigen Placed 1829    Allergen Manufacturer Jestine    Location Back    Number of Test 55    Panel 1 Select    1. Control-Buffer 50% Glycerol Negative    2. Control-Histamine 2+    3. Bahia Negative    4. French Southern Territories Negative    5. Johnson 2+    6. [place]  Blue Negative    7. Meadow Fescue Negative    8. Perennial Rye Negative    9. Timothy Negative    10. Ragweed Mix Negative    11. Cocklebur Negative    12. Plantain,  English 2+    13. Baccharis 2+    14. Dog Fennel 2+  15. Russian Thistle Negative    16. Lamb's Quarters Negative    17. Sheep Sorrell Negative    18. Rough Pigweed Negative    19. Marsh Elder, Rough Negative    20. Mugwort, Common Negative    21. Box, Elder Negative    22. Cedar, red Negative    23. Sweet Gum Negative    24. Pecan Pollen 3+    25. Pine Mix Negative    26. Walnut, Black Pollen Negative    27. Red Mulberry Negative    28. Ash Mix Negative    29. Birch Mix Negative    30. Beech American Negative    31. Cottonwood, Eastern 3+    32. Hickory, White 4+    33. Maple Mix Negative    34. Oak, Guinea[place] Mix  Negative    35. Sycamore Eastern Negative    36. Alternaria Alternata Negative    37. Cladosporium Herbarum Negative    38. Aspergillus Mix Negative    39. Penicillium Mix Negative    40. Bipolaris Sorokiniana (Helminthosporium) Negative    41. Drechslera Spicifera (Curvularia) Negative    42. Mucor Plumbeus Negative    43. Fusarium Moniliforme Negative    44. Aureobasidium Pullulans (pullulara) Negative    45. Rhizopus Oryzae Negative    46. Botrytis Cinera Negative    47. Epicoccum Nigrum Negative    48. Phoma Betae Negative    49. Dust Mite Mix Negative    50. Cat Hair 10,000 BAU/ml 3+    51.  Dog Epithelia Negative    52. Mixed Feathers Negative    53. Horse Epithelia Negative    54. Cockroach, German 2+    55. Tobacco Leaf Negative          Intradermal - 06/21/24 1910     Time Antigen Placed 1850    Allergen Manufacturer Jestine    Location Arm    Number of Test 12    Control Negative    Bahia 4+    French Southern Territories Negative    7 Grass 4+    Ragweed Mix Negative    Weed Mix 3+    Mold 1 2+    Mold 2 Negative    Mold 3 2+    Mold 4 2+    Mite Mix 3+    Dog 3+    Cockroach Omitted          Food Adult Perc - 06/21/24 1800     Time Antigen Placed 1829    Allergen Manufacturer Greer    Location Back    Number of allergen test 19    1. Peanut Negative    2. Soybean Negative    3. Wheat Negative    4. Sesame Negative    5. Milk, Cow Negative    6. Casein Negative    7. Egg White, Chicken Negative    8. Shellfish Mix Negative    9. Fish Mix Negative    10. Cashew Negative    11. Walnut Food Negative    12. Almond Negative    13. Hazelnut Negative    14. Pecan Food Negative    15. Pistachio Negative    16. [place] Nut Negative    17. Coconut Negative    55. Orange  Negative    65. Pineapple Negative          Reducing Pollen Exposure  The American Academy of Allergy, Asthma and Immunology suggests the following steps to reduce your  exposure to pollen  during allergy seasons.    Do not hang sheets or clothing out to dry; pollen may collect on these items. Do not mow lawns or spend time around freshly cut grass; mowing stirs up pollen. Keep windows closed at night.  Keep car windows closed while driving. Minimize morning activities outdoors, a time when pollen counts are usually at their highest. Stay indoors as much as possible when pollen counts or humidity is high and on windy days when pollen tends to remain in the air longer. Use air conditioning when possible.  Many air conditioners have filters that trap the pollen spores. Use a HEPA room air filter to remove pollen form the indoor air you breathe.  Control of Mold Allergen   Mold and fungi can grow on a variety of surfaces provided certain temperature and moisture conditions exist.  Outdoor molds grow on plants, decaying vegetation and soil.  The major outdoor mold, Alternaria and Cladosporium, are found in very high numbers during hot and dry conditions.  Generally, a late Summer - Fall peak is seen for common outdoor fungal spores.  Rain will temporarily lower outdoor mold spore count, but counts rise rapidly when the rainy period ends.  The most important indoor molds are Aspergillus and Penicillium.  Dark, humid and poorly ventilated basements are ideal sites for mold growth.  The next most common sites of mold growth are the bathroom and the kitchen.  Outdoor (Seasonal) Mold Control  Positive outdoor molds via skin testing: Alternaria, Cladosporium, Bipolaris (Helminthsporium), Drechslera (Curvalaria), and Mucor  Use air conditioning and keep windows closed Avoid exposure to decaying vegetation. Avoid leaf raking. Avoid grain handling. Consider wearing a face mask if working in moldy areas.    Indoor (Perennial) Mold Control   Positive indoor molds via skin testing: Fusarium, Aureobasidium (Pullulara), and Rhizopus  Maintain humidity below 50%. Clean washable surfaces with  5% bleach solution. Remove sources e.g. contaminated carpets.    Control of Dog or Cat Allergen  Avoidance is the best way to manage a dog or cat allergy. If you have a dog or cat and are allergic to dog or cats, consider removing the dog or cat from the home. If you have a dog or cat but don't want to find it a new home, or if your family wants a pet even though someone in the household is allergic, here are some strategies that may help keep symptoms at bay:  Keep the pet out of your bedroom and restrict it to only a few rooms. Be advised that keeping the dog or cat in only one room will not limit the allergens to that room. Don't pet, hug or kiss the dog or cat; if you do, wash your hands with soap and water . High-efficiency particulate air (HEPA) cleaners run continuously in a bedroom or living room can reduce allergen levels over time. Regular use of a high-efficiency vacuum cleaner or a central vacuum can reduce allergen levels. Giving your dog or cat a bath at least once a week can reduce airborne allergen.  Control of Dust Mite Allergen    Dust mites play a major role in allergic asthma and rhinitis.  They occur in environments with high humidity wherever human skin is found.  Dust mites absorb humidity from the atmosphere (ie, they do not drink) and feed on organic matter (including shed human and animal skin).  Dust mites are a microscopic type of insect that you cannot see with the naked eye.  High levels of dust mites have been detected from mattresses, pillows, carpets, upholstered furniture, bed covers, clothes, soft toys and any woven material.  The principal allergen of the dust mite is found in its feces.  A gram of dust may contain 1,000 mites and 250,000 fecal particles.  Mite antigen is easily measured in the air during house cleaning activities.  Dust mites do not bite and do not cause harm to humans, other than by triggering allergies/asthma.    Ways to decrease your  exposure to dust mites in your home:  Encase mattresses, box springs and pillows with a mite-impermeable barrier or cover   Wash sheets, blankets and drapes weekly in hot water  (130 F) with detergent and dry them in a dryer on the hot setting.  Have the room cleaned frequently with a vacuum cleaner and a damp dust-mop.  For carpeting or rugs, vacuuming with a vacuum cleaner equipped with a high-efficiency particulate air (HEPA) filter.  The dust mite allergic individual should not be in a room which is being cleaned and should wait 1 hour after cleaning before going into the room. Do not sleep on upholstered furniture (eg, couches).   If possible removing carpeting, upholstered furniture and drapery from the home is ideal.  Horizontal blinds should be eliminated in the rooms where the person spends the most time (bedroom, study, television room).  Washable vinyl, roller-type shades are optimal. Remove all non-washable stuffed toys from the bedroom.  Wash stuffed toys weekly like sheets and blankets above.   Reduce indoor humidity to less than 50%.  Inexpensive humidity monitors can be purchased at most hardware stores.  Do not use a humidifier as can make the problem worse and are not recommended.  Allergy Shots  Allergies are the result of a chain reaction that starts in the immune system. Your immune system controls how your body defends itself. For instance, if you have an allergy to pollen, your immune system identifies pollen as an invader or allergen. Your immune system overreacts by producing antibodies called Immunoglobulin E (IgE). These antibodies travel to cells that release chemicals, causing an allergic reaction.  The concept behind allergy immunotherapy, whether it is received in the form of shots or tablets, is that the immune system can be desensitized to specific allergens that trigger allergy symptoms. Although it requires time and patience, the payback can be long-term relief.  Allergy injections contain a dilute solution of those substances that you are allergic to based upon your skin testing and allergy history.   How Do Allergy Shots Work?  Allergy shots work much like a vaccine. Your body responds to injected amounts of a particular allergen given in increasing doses, eventually developing a resistance and tolerance to it. Allergy shots can lead to decreased, minimal or no allergy symptoms.  There generally are two phases: build-up and maintenance. Build-up often ranges from three to six months and involves receiving injections with increasing amounts of the allergens. The shots are typically given once or twice a week, though more rapid build-up schedules are sometimes used.  The maintenance phase begins when the most effective dose is reached. This dose is different for each person, depending on how allergic you are and your response to the build-up injections. Once the maintenance dose is reached, there are longer periods between injections, typically two to four weeks.  Occasionally doctors give cortisone-type shots that can temporarily reduce allergy symptoms. These types of shots are different and should not be confused with allergy  immunotherapy shots.  Who Can Be Treated with Allergy Shots?  Allergy shots may be a good treatment approach for people with allergic rhinitis (hay fever), allergic asthma, conjunctivitis (eye allergy) or stinging insect allergy.   Before deciding to begin allergy shots, you should consider:   The length of allergy season and the severity of your symptoms  Whether medications and/or changes to your environment can control your symptoms  Your desire to avoid long-term medication use  Time: allergy immunotherapy requires a major time commitment  Cost: may vary depending on your insurance coverage  Allergy shots for children age 23 and older are effective and often well tolerated. They might prevent the onset of new allergen  sensitivities or the progression to asthma.  Allergy shots are not started on patients who are pregnant but can be continued on patients who become pregnant while receiving them. In some patients with other medical conditions or who take certain common medications, allergy shots may be of risk. It is important to mention other medications you talk to your allergist.   What are the two types of build-ups offered:   RUSH or Rapid Desensitization -- one day of injections lasting from 8:30-4:30pm, injections every 1 hour.  Approximately half of the build-up process is completed in that one day.  The following week, normal build-up is resumed, and this entails ~16 visits either weekly or twice weekly, until reaching your "maintenance dose" which is continued weekly until eventually getting spaced out to every month for a duration of 3 to 5 years. The regular build-up appointments are nurse visits where the injections are administered, followed by required monitoring for 30 minutes.    Traditional build-up -- weekly visits for 6 -12 months until reaching "maintenance dose", then continue weekly until eventually spacing out to every 4 weeks as above. At these appointments, the injections are administered, followed by required monitoring for 30 minutes.     Either way is acceptable, and both are equally effective. With the rush protocol, the advantage is that less time is spent here for injections overall AND you would also reach maintenance dosing faster (which is when the clinical benefit starts to become more apparent). Not everyone is a candidate for rapid desensitization.   IF we proceed with the RUSH protocol, there are premedications which must be taken the day before and the day after the rush only (this includes antihistamines, steroids, and Singulair ).  After the rush day, no prednisone  or Singulair  is required, and we just recommend antihistamines taken on your injection day.  What Is An Estimate  of the Costs?  If you are interested in starting allergy injections, please check with your insurance company about your coverage for both allergy vial sets and allergy injections.  Please do so prior to making the appointment to start injections.  The following are CPT codes to give to your insurance company. These are the amounts we BILL to the insurance company, but the amount YOU WILL PAY and WE RECEIVE IS SUBSTANTIALLY LESS and depends on the contracts we have with different insurance companies.   Amount Billed to Insurance One allergy vial set  CPT 95165   $ 1200     Two allergy vial set  CPT 95165   $ 2400     Three allergy vial set  CPT 95165   $ 3600     One injection   CPT 95115   $ 35  Two injections   CPT 95117   $ 40  RUSH (Rapid Desensitization) CPT 95180 x 8 hours $500/hour  Regarding the allergy injections, your co-pay may or may not apply with each injection, so please confirm this with your insurance company. When you start allergy injections, 1 or 2 sets of vials are made based on your allergies.  Not all patients can be on one set of vials. A set of vials lasts 6 months to a year depending on how quickly you can proceed with your build-up of your allergy injections. Vials are personalized for each patient depending on their specific allergens.  How often are allergy injection given during the build-up period?   Injections are given at least weekly during the build-up period until your maintenance dose is achieved. Per the doctor's discretion, you may have the option of getting allergy injections two times per week during the build-up period. However, there must be at least 48 hours between injections. The build-up period is usually completed within 6-12 months depending on your ability to schedule injections and for adjustments for reactions. When maintenance dose is reached, your injection schedule is gradually changed to every two weeks and later to every three weeks. Injections  will then continue every 4 weeks. Usually, injections are continued for a total of 3-5 years.   When Will I Feel Better?  Some may experience decreased allergy symptoms during the build-up phase. For others, it may take as long as 12 months on the maintenance dose. If there is no improvement after a year of maintenance, your allergist will discuss other treatment options with you.  If you aren't responding to allergy shots, it may be because there is not enough dose of the allergen in your vaccine or there are missing allergens that were not identified during your allergy testing. Other reasons could be that there are high levels of the allergen in your environment or major exposure to non-allergic triggers like tobacco smoke.  What Is the Length of Treatment?  Once the maintenance dose is reached, allergy shots are generally continued for three to five years. The decision to stop should be discussed with your allergist at that time. Some people may experience a permanent reduction of allergy symptoms. Others may relapse and a longer course of allergy shots can be considered.  What Are the Possible Reactions?  The two types of adverse reactions that can occur with allergy shots are local and systemic. Common local reactions include very mild redness and swelling at the injection site, which can happen immediately or several hours after. Report a delayed reaction from your last injection. These include arm swelling or runny nose, watery eyes or cough that occurs within 12-24 hours after injection. A systemic reaction, which is less common, affects the entire body or a particular body system. They are usually mild and typically respond quickly to medications. Signs include increased allergy symptoms such as sneezing, a stuffy nose or hives.   Rarely, a serious systemic reaction called anaphylaxis can develop. Symptoms include swelling in the throat, wheezing, a feeling of tightness in the chest, nausea  or dizziness. Most serious systemic reactions develop within 30 minutes of allergy shots. This is why it is strongly recommended you wait in your doctor's office for 30 minutes after your injections. Your allergist is trained to watch for reactions, and his or her staff is trained and equipped with the proper medications to identify and treat them.   Report to the nurse immediately if you experience any of the following symptoms: swelling, itching or redness of  the skin, hives, watery eyes/nose, breathing difficulty, excessive sneezing, coughing, stomach pain, diarrhea, or light headedness. These symptoms may occur within 15-20 minutes after injection and may require medication.   Who Should Administer Allergy Shots?  The preferred location for receiving shots is your prescribing allergist's office. Injections can sometimes be given at another facility where the physician and staff are trained to recognize and treat reactions, and have received instructions by your prescribing allergist.  What if I am late for an injection?   Injection dose will be adjusted depending upon how many days or weeks you are late for your injection.   What if I am sick?   Please report any illness to the nurse before receiving injections. She may adjust your dose or postpone injections depending on your symptoms. If you have fever, flu, sinus infection or chest congestion it is best to postpone allergy injections until you are better. Never get an allergy injection if your asthma is causing you problems. If your symptoms persist, seek out medical care to get your health problem under control.  What If I am or Become Pregnant:  Women that become pregnant should schedule an appointment with [facility] before receiving any further allergy injections.

## 2024-06-23 ENCOUNTER — Ambulatory Visit: Admitting: Obstetrics and Gynecology

## 2024-06-23 ENCOUNTER — Encounter: Payer: Self-pay | Admitting: Allergy & Immunology

## 2024-06-28 ENCOUNTER — Other Ambulatory Visit: Payer: Self-pay | Admitting: Family Medicine

## 2024-07-05 ENCOUNTER — Ambulatory Visit: Admitting: Family Medicine

## 2024-07-06 ENCOUNTER — Ambulatory Visit (INDEPENDENT_AMBULATORY_CARE_PROVIDER_SITE_OTHER): Admitting: Obstetrics and Gynecology

## 2024-07-06 ENCOUNTER — Ambulatory Visit (INDEPENDENT_AMBULATORY_CARE_PROVIDER_SITE_OTHER)

## 2024-07-06 ENCOUNTER — Encounter: Payer: Self-pay | Admitting: Obstetrics and Gynecology

## 2024-07-06 VITALS — BP 110/74 | HR 91

## 2024-07-06 DIAGNOSIS — N930 Postcoital and contact bleeding: Secondary | ICD-10-CM

## 2024-07-06 DIAGNOSIS — Z3009 Encounter for other general counseling and advice on contraception: Secondary | ICD-10-CM | POA: Diagnosis not present

## 2024-07-06 DIAGNOSIS — N938 Other specified abnormal uterine and vaginal bleeding: Secondary | ICD-10-CM

## 2024-07-06 DIAGNOSIS — Z23 Encounter for immunization: Secondary | ICD-10-CM

## 2024-07-06 MED ORDER — ETONOGESTREL-ETHINYL ESTRADIOL 0.12-0.015 MG/24HR VA RING: 1.0000 | VAGINAL_RING | VAGINAL | 0 refills | Status: DC

## 2024-07-06 NOTE — Progress Notes (Signed)
   Acute Office Visit  Subjective:    Patient ID: Courtney  Courtney Holloway, female    DOB: [birth year], 22 y.o.   MRN: [id number]   HPI 23 y.o. presents today for Office Visit (US  Consult )  Gardesil 2/3 given she would like to complete today No changes in partners History of anemia Recent hospitalization for depression on Wellbutrin  Reports DUB with the nexplanon  and dyspareunia and post coital bleeding that is bothersome to her. Patient is having a hard time losing weight on wygovy. Only lost 4lbs in 4 months She did not start yaz and cannot remember to take pills  Patient's last menstrual period was 06/15/2024 (approximate). Period Cycle (Days):  (Sometimes gets a period twice a month) Period Pattern: (!) Irregular Menstrual Flow: Heavy Menstrual Control: Maxi pad, Tampon Dysmenorrhea: (!) Moderate Dysmenorrhea Symptoms: Cramping  Review of Systems     Objective:     OBGyn Exam  BP 110/74 (BP Location: Left Arm, Patient Position: Sitting)   Pulse 91   LMP 06/15/2024 (Approximate)   SpO2 96%   Breastfeeding No  Wt Readings from Last 3 Encounters:  06/04/24 240 lb (108.9 kg)  05/23/24 240 lb (108.9 kg)  05/19/24 224 lb 13.9 oz (102 kg)        Assessment & Plan:     PUS  9.62 cm anteverted uterus no masses Endometrial lining 3.80mm Left ovarian cyst 4.2 x 3.8cm simple and avascular RO WNL  DUB, post coital bleeding RTC for PUS, removal of nexplanon  To begin nuvaring today. Take on continuous basis with skipping week off and change q 4weeks. Remove nexplanon  in a week or so.  September she will begin her pap smear screening Counseled on PUS results  30 minutes spent on reviewing records, imaging,  and one on one patient time and counseling patient and documentation Dr. Glennon Almarie Courtney Holloway

## 2024-07-14 ENCOUNTER — Ambulatory Visit (INDEPENDENT_AMBULATORY_CARE_PROVIDER_SITE_OTHER): Admitting: Obstetrics and Gynecology

## 2024-07-14 ENCOUNTER — Encounter: Payer: Self-pay | Admitting: Obstetrics and Gynecology

## 2024-07-14 VITALS — BP 108/68 | HR 92 | Wt 241.6 lb

## 2024-07-14 DIAGNOSIS — Z3046 Encounter for surveillance of implantable subdermal contraceptive: Secondary | ICD-10-CM

## 2024-07-14 NOTE — Progress Notes (Signed)
   Acute Office Visit  Subjective:    Patient ID: Vani  Samule Hill, female    DOB: [birth year], 23 y.o.   MRN: [id number]   HPI 23 y.o. presents today for office visit  (NEXPLANON  REMOVAL left arm, having lots of bleeding and pain with it. )  Two periods in this month and they are painful. To begin nuva ring today  Patient's last menstrual period was 06/15/2024 (approximate).    Review of Systems    Nexplanon  Removal Time out performed Patient identified, informed consent performed, consent signed.  Nexplanon  site identified in left arm.  Area prepped in usual sterile fashon. One ml of 1% lidocaine  was used to anesthetize the area at the distal end of the implant. A small stab incision was made right beside the implant on the distal portion. The Nexplanon  rod was grasped using hemostats and removed without difficulty. There was minimal blood loss. There were no complications. Patient insertion site covered with gauze and a pressure bandage to reduce any bruising.  Objective:    OBGyn Exam  BP 108/68   Pulse 92   Wt 241 lb 9.6 oz (109.6 kg)   LMP 06/15/2024 (Approximate)   SpO2 96%   Breastfeeding No   BMI 44.19 kg/m  Wt Readings from Last 3 Encounters:  07/14/24 241 lb 9.6 oz (109.6 kg)  06/04/24 240 lb (108.9 kg)  05/23/24 240 lb (108.9 kg)        Erika, CMA was present for procedure  Assessment & Plan:  Nexplanon  removal  Nexplanon  removed without difficulty.  Post care instructions discussed with patient and counseled on concerning s/s and encouraged to return with any concerns.  Patient agreed.   Begin nuva ring toay  Almarie MARLA Carpen

## 2024-07-26 ENCOUNTER — Ambulatory Visit: Admitting: Family Medicine

## 2024-07-28 ENCOUNTER — Ambulatory Visit: Admitting: Family Medicine

## 2024-08-01 ENCOUNTER — Encounter: Payer: Self-pay | Admitting: Family Medicine

## 2024-08-01 ENCOUNTER — Ambulatory Visit: Payer: MEDICAID | Admitting: Family Medicine

## 2024-08-01 ENCOUNTER — Ambulatory Visit (INDEPENDENT_AMBULATORY_CARE_PROVIDER_SITE_OTHER): Payer: MEDICAID | Admitting: Family Medicine

## 2024-08-01 DIAGNOSIS — R7303 Prediabetes: Secondary | ICD-10-CM

## 2024-08-01 LAB — POCT GLYCOSYLATED HEMOGLOBIN (HGB A1C): Hemoglobin A1C: 5.6 % (ref 4.0–5.6)

## 2024-08-01 MED ORDER — ONDANSETRON 4 MG PO TBDP
4.0000 mg | ORAL_TABLET | Freq: Three times a day (TID) | ORAL | 2 refills | Status: DC | PRN
Start: 2024-08-01 — End: 2024-11-09

## 2024-08-01 NOTE — Assessment & Plan Note (Signed)
 Has lost about 8 pounds since starting the medication in March. This is only about 3% of her body weight. However she was only on the loading doses, did not start the therapeutic dose of 2.4 mg weekly until last month. I think that she will continue to lose weight now that she is on the therapeutic dose. I will follow up with her in another 4 months for a weight loss check. I have also prescribed PRN ondansetron , pt states it only makes her itchy, no rashes, states the promethazine  made her too sleepy.

## 2024-08-01 NOTE — Progress Notes (Signed)
 Established Patient Office Visit  Subjective   Patient ID: Courtney Holloway, female    DOB: March 05, 2001  Age: 23 y.o. MRN: [id number]  Chief Complaint  Patient presents with   Medical Management of Chronic Issues    Pt is here for weight loss follow up. She reports that she has been taking the wegovy  as prescribed, has been on the 2.4 dose for only about 1 month. States that she isn't sure how much weight has has lost since she started the medication. Starting weight was 247, now down to 239. Total loss of 8 pounds. States that she is having side effects from the wegovy  including diarrhea and consistent nausea. States that she has also had issues with her periods, states that she had her birth control method switched from the nexplanon  to the nuvaring. Periods have improved. States that she also stopped the abilify  and she is seeing a psychiatrist now. States she is also off the stimulant medication. States the psychiatrist may restart it but not just yet.   Pt states she has stopped all sugary beverages and she is working out/exercise. She reports she is adherent to the diet that I gave her previously.     Current Outpatient Medications  Medication Instructions   escitalopram  (LEXAPRO ) 5 mg, Daily   etonogestrel -ethinyl estradiol  (NUVARING) 0.12-0.015 MG/24HR vaginal ring 1 each, Vaginal, Every 30 days, Insert vaginally and leave in place for 4 consecutive weeks on a continuous basis   guanFACINE  (TENEX ) 1 mg, Daily   ondansetron  (ZOFRAN -ODT) 4 mg, Oral, Every 8 hours PRN   traZODone  (DESYREL ) 50 mg, Oral, At bedtime PRN   Wegovy  2.4 mg, Subcutaneous, Weekly    Patient Active Problem List   Diagnosis Date Noted   Dysfunctional uterine bleeding 04/13/2023   Postpartum anemia 04/13/2023   Deficiency anemia 04/03/2023   Status post primary low transverse cesarean section 12/23/2022   BMI 50.0-59.9, adult (HCC) 12/04/2022   HSV (herpes simplex virus) anogenital infection  11/17/2022   Suicidal ideation 10/31/2022   Morbid obesity (HCC) 07/16/2022   Body mass index (BMI) of 39.0 to 39.9 in adult 09/02/2021   Prediabetes 08/04/2021   Nightmares associated with chronic post-traumatic stress disorder 04/01/2021   Anal fissure 03/28/2021   Drug overdose, multiple drugs, undetermined intent, initial encounter 02/02/2021   Attention deficit hyperactivity disorder (ADHD), combined type 01/28/2021   MDD (major depressive disorder), recurrent severe, without psychosis (HCC) 01/16/2021   Dyslipidemia 02/15/2018   Hidradenitis axillaris 07/11/2016   Slow transit constipation 11/21/2014   Asthma 05/04/2013   GAD (generalized anxiety disorder) 11/02/2012   Post-traumatic stress disorder 11/02/2012      Review of Systems  All other systems reviewed and are negative.     Objective:     BP 100/70   Pulse 93   Temp 98.8 F (37.1 C) (Oral)   Ht 5' 2 (1.575 m)   Wt 239 lb 12.8 oz (108.8 kg)   LMP 06/15/2024 (Approximate)   SpO2 98%   BMI 43.86 kg/m    Physical Exam Constitutional:      Appearance: Normal appearance.  Cardiovascular:     Rate and Rhythm: Normal rate and regular rhythm.     Heart sounds: Normal heart sounds. No murmur heard. Pulmonary:     Effort: Pulmonary effort is normal.     Breath sounds: Normal breath sounds. No wheezing.  Neurological:     Mental Status: She is alert and oriented to person, place, and time. Mental status is  at baseline.  Psychiatric:        Mood and Affect: Mood normal. Affect is blunt.      Results for orders placed or performed in visit on 08/01/24  POC HgB A1c  Result Value Ref Range   Hemoglobin A1C 5.6 4.0 - 5.6 %   HbA1c POC (<> result, manual entry)     HbA1c, POC (prediabetic range)     HbA1c, POC (controlled diabetic range)        The ASCVD Risk score (Arnett DK, et al., 2019) failed to calculate for the following reasons:   The 2019 ASCVD risk score is only valid for ages 56 to 50     Assessment & Plan:  Morbid obesity (HCC) Assessment & Plan: Has lost about 8 pounds since starting the medication in March. This is only about 3% of her body weight. However she was only on the loading doses, did not start the therapeutic dose of 2.4 mg weekly until last month. I think that she will continue to lose weight now that she is on the therapeutic dose. I will follow up with her in another 4 months for a weight loss check. I have also prescribed PRN ondansetron , pt states it only makes her itchy, no rashes, states the promethazine  made her too sleepy.   Orders: -     Ondansetron ; Take 1 tablet (4 mg total) by mouth every 8 (eight) hours as needed for nausea or vomiting.  Dispense: 20 tablet; Refill: 2  Prediabetes -     POCT glycosylated hemoglobin (Hb A1C)   A1C much improved, down to 5.6 on the wegovy , pt need to lose 5% of her body weight in order to re-authorize the medication. I will bring her back in 1 month for another weight check. This was discussed with the patient also and she reports she will work harder to lose the weight.   Return in about 1 month (around 09/01/2024) for weight loss.    Heron CHRISTELLA Sharper, MD

## 2024-08-03 ENCOUNTER — Telehealth: Payer: MEDICAID | Admitting: Physician Assistant

## 2024-08-03 ENCOUNTER — Encounter: Payer: Self-pay | Admitting: Oncology

## 2024-08-03 DIAGNOSIS — H6690 Otitis media, unspecified, unspecified ear: Secondary | ICD-10-CM | POA: Diagnosis not present

## 2024-08-03 MED ORDER — NEOMYCIN-POLYMYXIN-HC 3.5-10000-1 OT SOLN: 3.0000 [drp] | Freq: Four times a day (QID) | OTIC | 0 refills | Status: AC

## 2024-08-03 MED ORDER — AMOXICILLIN 875 MG PO TABS: 875.0000 mg | ORAL_TABLET | Freq: Two times a day (BID) | ORAL | 0 refills | Status: AC

## 2024-08-03 NOTE — Progress Notes (Signed)
 E-Visit for Ear Pain - Acute Otitis Media   We are sorry that you are not feeling well. Here is how we plan to help!  Based on what you have shared with me it looks like you have Acute Otitis Media.  Acute Otitis Media is an infection of the middle or inner ear. This type of infection can cause redness, inflammation, and fluid buildup behind the tympanic membrane (ear drum).  The usual symptoms include: Earache/Pain Fever Upper respiratory symptoms Lack of energy/Fatigue/Malaise Slight hearing loss gradually worsening- if the inner ear fills with fluid What causes middle ear infections? Most middle ear infections occur when an infection such as a cold, leads to a build-up of mucus in the middle ear and causes the Eustachian tube (a thin tube that runs from the middle ear to the back of the nose) to become swollen or blocked.   This means mucus can't drain away properly, making it easier for an infection to spread into the middle ear.  How middle ear infections are treated: Most ear infections clear up within three to five days and don't need any specific treatment. If necessary, tylenol  or ibuprofen  should be used to relieve pain and a high temperature.  If you develop a fever higher than 102, or any significantly worsening symptoms, this could indicate a more serious infection moving to the middle/inner and needs face to face evaluation in an office by a provider.   Antibiotics aren't routinely used to treat middle ear infections, although they may occasionally be prescribed if symptoms persist or are particularly severe. Given your presentation,   I have prescribed Amoxicillin  875 mg one tablet twice daily for 10 days  I have prescribed: Neomycin  0.35%, polymyxin B  10,000 units/mL, and hydrocortisone  0,5% otic solution 3 drops in affected ears four times a day for 7 days. For feeling of ear plugged, consider taking over the counter oral decongestant ie Sudafed as directed on the box.     Your symptoms should improve over the next 3 days and should resolve in about 7 days. Be sure to complete ALL of the prescription(s) given.  HOME CARE: Wash your hands frequently. If you are prescribed an ear drop, do not place the tip of the bottle on your ear or touch it with your fingers. You can take Acetaminophen  650 mg every 4-6 hours as needed for pain.  If pain is severe or moderate, you can apply a heating pad (set on low) or hot water  bottle (wrapped in a towel) to outer ear for 20 minutes.  This will also increase drainage.  GET HELP RIGHT AWAY IF: Fever is over 102.2 degrees. You develop progressive ear pain or hearing loss. Ear symptoms persist longer than 3 days after treatment.  MAKE SURE YOU: Understand these instructions. Will watch your condition. Will get help right away if you are not doing well or get worse.  Thank you for choosing an e-visit.  Your e-visit answers were reviewed by a board certified advanced clinical practitioner to complete your personal care plan. Depending upon the condition, your plan could have included both over the counter or prescription medications.  Please review your pharmacy choice. Make sure the pharmacy is open so you can pick up the prescription now. If there is a problem, you may contact your provider through [employer] and have the prescription routed to another pharmacy.  Your safety is important to us . If you have drug allergies check your prescription carefully.   For the next  24 hours you can use MyChart to ask questions about today's visit, request a non-urgent call back, or ask for a work or school excuse. You will get an email with a survey after your eVisit asking about your experience. We would appreciate your feedback. I hope that your e-visit has been valuable and will aid in your recovery.  I have spent 5 minutes in review of e-visit questionnaire, review and updating patient chart, medical decision making and  response to patient.   Kenston Longton, PA-C

## 2024-08-11 ENCOUNTER — Encounter: Payer: Self-pay | Admitting: Oncology

## 2024-08-17 ENCOUNTER — Encounter: Payer: Self-pay | Admitting: Family Medicine

## 2024-08-24 ENCOUNTER — Encounter: Payer: Self-pay | Admitting: Oncology

## 2024-08-27 ENCOUNTER — Emergency Department (HOSPITAL_COMMUNITY)
Admission: EM | Admit: 2024-08-27 | Discharge: 2024-08-27 | Disposition: A | Discharge status: Home / Self Care | Payer: MEDICAID | Attending: Emergency Medicine | Admitting: Emergency Medicine

## 2024-08-27 ENCOUNTER — Emergency Department (HOSPITAL_COMMUNITY): Payer: MEDICAID

## 2024-08-27 DIAGNOSIS — E119 Type 2 diabetes mellitus without complications: Secondary | ICD-10-CM | POA: Diagnosis not present

## 2024-08-27 DIAGNOSIS — R110 Nausea: Secondary | ICD-10-CM | POA: Diagnosis not present

## 2024-08-27 DIAGNOSIS — H538 Other visual disturbances: Secondary | ICD-10-CM | POA: Diagnosis not present

## 2024-08-27 DIAGNOSIS — R519 Headache, unspecified: Secondary | ICD-10-CM | POA: Insufficient documentation

## 2024-08-27 DIAGNOSIS — R42 Dizziness and giddiness: Secondary | ICD-10-CM | POA: Diagnosis not present

## 2024-08-27 LAB — I-STAT CHEM 8, ED
BUN: 11 mg/dL (ref 6–20)
Calcium, Ion: 1.06 mmol/L — ABNORMAL LOW (ref 1.15–1.40)
Chloride: 108 mmol/L (ref 98–111)
Creatinine, Ser: 0.9 mg/dL (ref 0.44–1.00)
Glucose, Bld: 80 mg/dL (ref 70–99)
HCT: 39 % (ref 36.0–46.0)
Hemoglobin: 13.3 g/dL (ref 12.0–15.0)
Potassium: 3.8 mmol/L (ref 3.5–5.1)
Sodium: 140 mmol/L (ref 135–145)
TCO2: 20 mmol/L — ABNORMAL LOW (ref 22–32)

## 2024-08-27 LAB — BASIC METABOLIC PANEL WITH GFR
Anion gap: 9 (ref 5–15)
BUN: 10 mg/dL (ref 6–20)
CO2: 22 mmol/L (ref 22–32)
Calcium: 8 mg/dL — ABNORMAL LOW (ref 8.9–10.3)
Chloride: 110 mmol/L (ref 98–111)
Creatinine, Ser: 0.92 mg/dL (ref 0.44–1.00)
GFR, Estimated: >60 mL/min (ref >60)
Glucose, Bld: 85 mg/dL (ref 70–99)
Potassium: 3.9 mmol/L (ref 3.5–5.1)
Sodium: 141 mmol/L (ref 135–145)

## 2024-08-27 LAB — CBC WITH DIFFERENTIAL/PLATELET
Abs Immature Granulocytes: 0.07 K/uL (ref 0.00–0.07)
Basophils Absolute: 0.1 K/uL (ref 0.0–0.1)
Basophils Relative: 0 %
Eosinophils Absolute: 0.1 K/uL (ref 0.0–0.5)
Eosinophils Relative: 0 %
HCT: 39.9 % (ref 36.0–46.0)
Hemoglobin: 12.9 g/dL (ref 12.0–15.0)
Immature Granulocytes: 0 %
Lymphocytes Relative: 12 %
Lymphs Abs: 2 K/uL (ref 0.7–4.0)
MCH: 27.4 pg (ref 26.0–34.0)
MCHC: 32.3 g/dL (ref 30.0–36.0)
MCV: 84.7 fL (ref 80.0–100.0)
Monocytes Absolute: 1 K/uL (ref 0.1–1.0)
Monocytes Relative: 6 %
Neutro Abs: 13.8 K/uL — ABNORMAL HIGH (ref 1.7–7.7)
Neutrophils Relative %: 82 %
Platelets: 326 K/uL (ref 150–400)
RBC: 4.71 MIL/uL (ref 3.87–5.11)
RDW: 14.3 % (ref 11.5–15.5)
WBC: 16.9 K/uL — ABNORMAL HIGH (ref 4.0–10.5)
nRBC: 0 % (ref 0.0–0.2)

## 2024-08-27 LAB — RESP PANEL BY RT-PCR (RSV, FLU A&B, COVID)  RVPGX2
Influenza A by PCR: NEGATIVE
Influenza B by PCR: NEGATIVE
Resp Syncytial Virus by PCR: NEGATIVE
SARS Coronavirus 2 by RT PCR: NEGATIVE

## 2024-08-27 LAB — HCG, SERUM, QUALITATIVE: Preg, Serum: NEGATIVE

## 2024-08-27 MED ORDER — DIPHENHYDRAMINE HCL 50 MG/ML IJ SOLN
12.5000 mg | Freq: Once | INTRAMUSCULAR | Status: AC
  Administered 2024-08-27: 12.5 mg via INTRAVENOUS
  Filled 2024-08-27: qty 1

## 2024-08-27 MED ORDER — SODIUM CHLORIDE 0.9 % IV BOLUS
1000.0000 mL | Freq: Once | INTRAVENOUS | Status: AC
  Administered 2024-08-27: 1000 mL via INTRAVENOUS

## 2024-08-27 MED ORDER — METOCLOPRAMIDE HCL 5 MG/ML IJ SOLN
10.0000 mg | Freq: Once | INTRAMUSCULAR | Status: AC
  Administered 2024-08-27: 10 mg via INTRAVENOUS
  Filled 2024-08-27: qty 2

## 2024-08-27 NOTE — ED Triage Notes (Signed)
 Pt BIB [facility] from home d/t HA that started last night. Pt stated she did not feel better this morning. She [place] to the fair and walked around which made it worse. Initial bp 80/40. Went up to systolic 110 after 500 ml fluids given by ems. A&O X4.

## 2024-08-27 NOTE — Discharge Instructions (Addendum)
 You were seen in the emergency department today for dizziness and headache.  While you are here we did a physical exam, labs and head scan that were all reassuring and showed no emergency cause for your symptoms today.  You had relief of your headache with medications given in the emergency department.  Wegovy  may be causing some of the symptoms and you should talk to your primary care doctor about whether or not you should continue taking this medication.  Please stay well-hydrated at home.  You may take ibuprofen  and/or Tylenol  unless you have a medical reason that you cannot take these medications.  Please come back to the emergency department if you pass out, have severe worsening headache, develop fevers or neck stiffness or if you have any other reason to believe you need emergency care.

## 2024-08-27 NOTE — ED Provider Notes (Signed)
 Assume Care Note  I assumed care of this patient 3 PM.  She is coming in with dizziness, low blood pressure and decreased p.o. intake in the setting of donating plasma today and starting Wegovy .  Head CT reviewed myself shows no intracranial abnormalities.  Patient states that she has had complete resolution of her headache and dizziness with Reglan , fluids and Benadryl  given by the previous provider.  She is normotensive and feels ready to go home.  Labs are reassuring with no significant electrolyte abnormality, AKI or anemia.  Patient does have leukocytosis.  COVID/flu testing negative.  Patient discharged home with return precautions.  Counseled on talking to her PCP about possibly stopping Wegovy   Physical Exam  BP 111/71   Pulse 96   Temp (!) 97.5 F (36.4 C) (Oral)   Resp 18   SpO2 100%      Medical Decision Making Problems Addressed: Nonintractable headache, unspecified chronicity pattern, unspecified headache type: complicated acute illness or injury that poses a threat to life or bodily functions  Amount and/or Complexity of Data Reviewed Labs: ordered. Decision-making details documented in ED Course. Radiology: ordered and independent interpretation performed. Decision-making details documented in ED Course.  Risk Prescription drug management. Decision regarding hospitalization.   Dispo: Discharge        Dionisio Blunt, MD 08/27/24 1707    Bernard Drivers, MD 08/27/24 1827

## 2024-08-27 NOTE — ED Notes (Signed)
 Pt reports feeling better after interventions and reports no headache.

## 2024-08-27 NOTE — ED Provider Notes (Signed)
 [place] EMERGENCY DEPARTMENT AT [facility] Provider Note   CSN: [id number] Arrival date & time: 08/27/24  1416     Patient presents with: Headache  HPI Baneen  Zonzarae Mosco is a 23 y.o. female with GAD, MDD, diabetes, headaches presenting for headache. She states it started last night she reports a poor appetite and poor fluid intake. Attributes this to being on Wegovy .  The headache was gradual in onset and she did report some bilateral visual blurriness but has improved.  Denies neck stiffness or fever.   Afraid she will vomit if she eats or drinks. Also reports that she donated plasma this morning.  This afternoon she was at the fair walking around and started to feel lightheaded and very nauseous felt like she was going to pass out.      Headache      Prior to Admission medications   Medication Sig Start Date End Date Taking? Authorizing Provider  escitalopram  (LEXAPRO ) 5 MG tablet Take 5 mg by mouth daily. 07/15/24   [provider]  etonogestrel -ethinyl estradiol  (NUVARING) 0.12-0.015 MG/24HR vaginal ring Place 1 each vaginally every 30 (thirty) days. Insert vaginally and leave in place for 4 consecutive weeks on a continuous basis 07/06/24   Glennon Almarie POUR, MD  guanFACINE  (TENEX ) 1 MG tablet Take 1 mg by mouth daily. 07/15/24   [provider]  ondansetron  (ZOFRAN -ODT) 4 MG disintegrating tablet Take 1 tablet (4 mg total) by mouth every 8 (eight) hours as needed for nausea or vomiting. 08/01/24   Ozell Heron HERO, MD  Semaglutide -Weight Management (WEGOVY ) 2.4 MG/0.75ML SOAJ Inject 2.4 mg into the skin once a week. 06/28/24   Ozell Heron HERO, MD  traZODone  (DESYREL ) 50 MG tablet Take 1 tablet (50 mg total) by mouth at bedtime as needed for sleep. 05/20/24   Motley-Mangrum, Jadeka A, PMHNP    Allergies: Other, Pineapple, Shellfish allergy , Shellfish-derived products, Acetaminophen , Fish-derived products, Naproxen , and Zofran  [ondansetron  hcl]     Review of Systems  Neurological:  Positive for headaches.    Updated Vital Signs BP (!) 91/52   Pulse (!) 110   Temp (!) 97.5 F (36.4 C) (Oral)   Resp 18   SpO2 100%   Physical Exam Vitals and nursing note reviewed.  HENT:     Head: Normocephalic and atraumatic.     Mouth/Throat:     Mouth: Mucous membranes are moist.  Eyes:     General:        Right eye: No discharge.        Left eye: No discharge.     Conjunctiva/sclera: Conjunctivae normal.  Cardiovascular:     Rate and Rhythm: Normal rate and regular rhythm.     Pulses: Normal pulses.     Heart sounds: Normal heart sounds.  Pulmonary:     Effort: Pulmonary effort is normal.     Breath sounds: Normal breath sounds.  Abdominal:     General: Abdomen is flat.     Palpations: Abdomen is soft.  Skin:    General: Skin is warm and dry.  Neurological:     General: No focal deficit present.     Comments: GCS 15. Speech is goal oriented. No deficits appreciated to CN III-XII; symmetric eyebrow raise, no facial drooping, tongue midline. Patient has equal grip strength bilaterally with 5/5 strength against resistance in all major muscle groups bilaterally. Sensation to light touch intact. Patient moves extremities without ataxia. Normal finger-nose-finger.   Psychiatric:  Mood and Affect: Mood normal.     (all labs ordered are listed, but only abnormal results are displayed) Labs Reviewed  CBC WITH DIFFERENTIAL/PLATELET - Abnormal; Notable for the following components:      Result Value   WBC 16.9 (*)    Neutro Abs 13.8 (*)    All other components within normal limits  I-STAT CHEM 8, ED - Abnormal; Notable for the following components:   Calcium , Ion 1.06 (*)    TCO2 20 (*)    All other components within normal limits  RESP PANEL BY RT-PCR (RSV, FLU A&B, COVID)  RVPGX2  HCG, SERUM, QUALITATIVE  BASIC METABOLIC PANEL WITH GFR    EKG: None  Radiology: No results found.   Procedures   Medications  Ordered in the ED  metoCLOPramide  (REGLAN ) injection 10 mg (10 mg Intravenous Given 08/27/24 1445)  sodium chloride  0.9 % bolus 1,000 mL (1,000 mLs Intravenous New Bag/Given 08/27/24 1444)  diphenhydrAMINE  (BENADRYL ) injection 12.5 mg (12.5 mg Intravenous Given 08/27/24 1445)    Clinical Course as of 08/27/24 1517  Sat Aug 27, 2024  1431 This is a 23 year old female presenting to ED with complaint of headache and feeling unwell.  Patient reports gradual onset of frontal headache yesterday.  She states that the headache was persistent overnight, she took Advil  this morning and went to a local fair.  She says began to feel woozy, nauseated, like she was going to pass out.  EMS was called to scene.  They noted the patient was hypotensive and she was given 500 cc of fluid by EMS.  Patient was afebrile per them.  Patient denies any sick contacts in the house.  She denies any recent fevers, chills, changes in vision, sore throat, numbness or weakness.  She denies to me that she has a prior history of headaches.  However, per my review of her medical chart, she has been seen in the ED in the past for headaches and migraines and has this documented on prior ED visits. [MT]  1432 Overall the patient's neurological exam is reassuring.  There are no red flag features for subarachnoid hemorrhage or risk factors for aneurysm.  No clear signs or symptoms of infectious etiology for headache including meningitis.  I do think a CT scan is reasonable given that this is an atypical feature aniline for her headaches.  Will give her a migraine cocktail as well as some fluids.  Will need to continue monitoring her blood pressure.  We will also perform viral testing and check her white blood cell count. [MT]  1433 Of note, patient does report she has had very little fluid intake the past few days.  She says she does not eat or drink very much.  This may be a cause for tachycardia and hypotension.  Will see if these improve with  fluids [MT]  1500 23 y.o. presyncopal event with HA. Hypotensive with EMS. Started Wegovy  and is not eating or drinking. Also donated plasma today. Progressive headache, history of migraines. [AF]    Clinical Course User Index [AF] Dionisio Blunt, MD [MT] Cottie Donnice PARAS, MD                                 Medical Decision Making Amount and/or Complexity of Data Reviewed Labs: ordered. Radiology: ordered.  Risk Prescription drug management.   23 year old well-appearing female present for headache.  Exam was unremarkable.  DDx includes  meningitis, SAH, stroke, migraine, sepsis, dehydration, electrolyte derangement, other.  Primary suspicion is that she is dehydrated given that she gave plasma this morning and she is also not had endorses poor p.o. intake for the past week.  Doubt meningitis given lack of meningismal symptoms, well-appearing nontoxic, no nuchal rigidity.  CT head is pending.  Hypotensive initially.  She is receiving IV fluids at this time.  Also headache cocktail for headache.  If her symptoms improve, blood pressure normalizes and CT is reassuring suspect likely discharge.  Signed out to oncoming ED resident Marsa Hind, MD.     Final diagnoses:  Nonintractable headache, unspecified chronicity pattern, unspecified headache type    ED Discharge Orders     None          Lang Norleen POUR, PA-C 08/27/24 1517    Cottie Donnice PARAS, MD 08/27/24 [phone]

## 2024-08-31 ENCOUNTER — Ambulatory Visit: Payer: MEDICAID | Admitting: Obstetrics and Gynecology

## 2024-09-01 ENCOUNTER — Ambulatory Visit: Payer: MEDICAID | Admitting: Family Medicine

## 2024-09-02 ENCOUNTER — Telehealth: Payer: Self-pay | Admitting: Family Medicine

## 2024-09-02 NOTE — Telephone Encounter (Signed)
 Patient has missed 3 appointments since 01/04/24, which qualifies for dismissal. Please advise, thanks!

## 2024-09-02 NOTE — Telephone Encounter (Signed)
 Looks like she has an appt 9/25, if she misses that one then ok to dismiss

## 2024-09-05 ENCOUNTER — Encounter: Payer: Self-pay | Admitting: Family Medicine

## 2024-09-06 ENCOUNTER — Telehealth: Payer: MEDICAID | Admitting: Family

## 2024-09-06 DIAGNOSIS — H9203 Otalgia, bilateral: Secondary | ICD-10-CM

## 2024-09-06 DIAGNOSIS — J029 Acute pharyngitis, unspecified: Secondary | ICD-10-CM

## 2024-09-06 NOTE — Progress Notes (Signed)
  Because you were treated for an ear infection last month, I feel your condition warrants further evaluation and I recommend that you be seen in a face-to-face visit. You need to be seen in person to have your ears looked at and possible strep test.    NOTE: There will be NO CHARGE for this E-Visit   If you are having a true medical emergency, please call 911.     For an urgent face to face visit, [place] has multiple urgent care centers for your convenience.  Click the link below for the full list of locations and hours, walk-in wait times, appointment scheduling options and driving directions:  Urgent Care - [place], [place], [place], [place], [place], [place]  [place]     Your MyChart E-visit questionnaire answers were reviewed by a board certified advanced clinical practitioner to complete your personal care plan based on your specific symptoms.    Thank you for using e-Visits.

## 2024-09-08 ENCOUNTER — Encounter: Payer: Self-pay | Admitting: Oncology

## 2024-09-08 ENCOUNTER — Ambulatory Visit: Payer: MEDICAID | Admitting: Family Medicine

## 2024-09-08 ENCOUNTER — Inpatient Hospital Stay: Payer: MEDICAID | Admitting: Adult Health

## 2024-09-08 ENCOUNTER — Encounter: Payer: Self-pay | Admitting: Family Medicine

## 2024-09-09 ENCOUNTER — Telehealth: Payer: Self-pay | Admitting: Family Medicine

## 2024-09-09 ENCOUNTER — Encounter: Payer: Self-pay | Admitting: Family Medicine

## 2024-09-09 ENCOUNTER — Ambulatory Visit (INDEPENDENT_AMBULATORY_CARE_PROVIDER_SITE_OTHER): Payer: MEDICAID | Admitting: Family Medicine

## 2024-09-09 DIAGNOSIS — R400 Somnolence: Secondary | ICD-10-CM

## 2024-09-09 MED ORDER — WEGOVY 2.4 MG/0.75ML ~~LOC~~ SOAJ: 2.4000 mg | SUBCUTANEOUS | 5 refills | Status: DC

## 2024-09-09 NOTE — Telephone Encounter (Signed)
 Disregard previous message, pt already dismissed.

## 2024-09-09 NOTE — Assessment & Plan Note (Addendum)
 Pt is down 13 pounds total which is a 5% weight loss in total, patient will continue her wegovy  as prescribed. I am ordering a sleep study for her for hte excessive day time fatigue to rule out OSA.

## 2024-09-09 NOTE — Telephone Encounter (Signed)
 Pt has missed 4 appointments which qualifies for dismissal. Please advise, thanks!

## 2024-09-09 NOTE — Progress Notes (Signed)
 Established Patient Office Visit  Subjective   Patient ID: Courtney Holloway  Courtney Holloway, female    DOB: 2001-07-04  Age: 23 y.o. MRN: [id number]  Chief Complaint  Patient presents with   Medical Management of Chronic Issues    Bronchitis f/u; weight management     Pt reports that she has been feeling ill for a while, states that she has been coughing quite a bit, sinus congestion, no energy, had testing for flu covid and RSV which were negative. States that she was given amoxicillin  on 9/23, not sure if it is really helping at this time sinc she has only been on it for a couple of days. She rpeorts excessive daytime fatigue even when she is not sick. She reports feeling tired all of the time.     Weight loss -- pt reports continued weight loss this month, is down to 235 today, 4 pound wieght loss in the last 1 month. Starting weight in March 2025: 247, today's weight is 235, 13 pound weight loss, has only been on the full 2.4 mg dose for the last 2 months. Does cause some nausea, but it Is also doing a good job controlling her appetite.       Current Outpatient Medications  Medication Instructions   escitalopram  (LEXAPRO ) 5 mg, Daily   etonogestrel -ethinyl estradiol  (NUVARING) 0.12-0.015 MG/24HR vaginal ring 1 each, Vaginal, Every 30 days, Insert vaginally and leave in place for 4 consecutive weeks on a continuous basis   guanFACINE  (TENEX ) 1 mg, Daily   ondansetron  (ZOFRAN -ODT) 4 mg, Oral, Every 8 hours PRN   traZODone  (DESYREL ) 50 mg, Oral, At bedtime PRN   Wegovy  2.4 mg, Subcutaneous, Weekly    Patient Active Problem List   Diagnosis Date Noted   Dysfunctional uterine bleeding 04/13/2023   Postpartum anemia 04/13/2023   Deficiency anemia 04/03/2023   Status post primary low transverse cesarean section 12/23/2022   BMI 50.0-59.9, adult (HCC) 12/04/2022   HSV (herpes simplex virus) anogenital infection 11/17/2022   Suicidal ideation 10/31/2022   Morbid obesity (HCC) 07/16/2022    Body mass index (BMI) of 39.0 to 39.9 in adult 09/02/2021   Prediabetes 08/04/2021   Nightmares associated with chronic post-traumatic stress disorder 04/01/2021   Anal fissure 03/28/2021   Drug overdose, multiple drugs, undetermined intent, initial encounter 02/02/2021   Attention deficit hyperactivity disorder (ADHD), combined type 01/28/2021   MDD (major depressive disorder), recurrent severe, without psychosis (HCC) 01/16/2021   Dyslipidemia 02/15/2018   Hidradenitis axillaris 07/11/2016   Slow transit constipation 11/21/2014   Asthma 05/04/2013   GAD (generalized anxiety disorder) 11/02/2012   Post-traumatic stress disorder 11/02/2012     Review of Systems  All other systems reviewed and are negative.     Objective:     BP 100/60   Pulse 70   Temp 98.2 F (36.8 C)   Wt 235 lb 1.6 oz (106.6 kg)   SpO2 98%   BMI 43.00 kg/m    Physical Exam Vitals reviewed.  Constitutional:      Appearance: Normal appearance. She is well-groomed. She is morbidly obese.  HENT:     Right Ear: Tympanic membrane is injected and erythematous.     Left Ear: Tympanic membrane normal.  Cardiovascular:     Rate and Rhythm: Normal rate and regular rhythm.     Heart sounds: S1 normal and S2 normal.  Pulmonary:     Effort: Pulmonary effort is normal.     Breath sounds: Normal breath sounds and air  entry.  Neurological:     Mental Status: She is alert and oriented to person, place, and time. Mental status is at baseline.     Gait: Gait is intact.  Psychiatric:        Mood and Affect: Mood and affect normal.        Speech: Speech normal.        Behavior: Behavior normal.      No results found for any visits on 09/09/24.    The ASCVD Risk score (Arnett DK, et al., 2019) failed to calculate for the following reasons:   The 2019 ASCVD risk score is only valid for ages 66 to 75    Assessment & Plan:  Morbid obesity (HCC) Assessment & Plan: Pt is down 13 pounds total which is a  5% weight loss in total, patient will continue her wegovy  as prescribed. I am ordering a sleep study for her for hte excessive day time fatigue to rule out OSA.   Orders: -     Wegovy ; Inject 2.4 mg into the skin once a week.  Dispense: 3 mL; Refill: 5  Daytime somnolence -     Ambulatory referral to Sleep Studies     No follow-ups on file.    Heron CHRISTELLA Sharper, MD

## 2024-09-09 NOTE — Telephone Encounter (Signed)
 noted

## 2024-09-19 ENCOUNTER — Encounter: Payer: Self-pay | Admitting: Family Medicine

## 2024-09-19 NOTE — Telephone Encounter (Signed)
 Can you let [facility] know? If we cannot do a home sleep study then she will need to be referred to pulmonary

## 2024-09-20 ENCOUNTER — Other Ambulatory Visit (HOSPITAL_COMMUNITY): Payer: Self-pay

## 2024-09-21 ENCOUNTER — Other Ambulatory Visit (HOSPITAL_COMMUNITY)
Admission: RE | Admit: 2024-09-21 | Discharge: 2024-09-21 | Disposition: A | Discharge status: Home / Self Care | Payer: MEDICAID | Source: Ambulatory Visit | Attending: Obstetrics and Gynecology | Admitting: Obstetrics and Gynecology

## 2024-09-21 ENCOUNTER — Encounter: Payer: Self-pay | Admitting: Oncology

## 2024-09-21 ENCOUNTER — Ambulatory Visit: Payer: MEDICAID | Admitting: Obstetrics and Gynecology

## 2024-09-21 ENCOUNTER — Encounter: Payer: Self-pay | Admitting: Obstetrics and Gynecology

## 2024-09-21 VITALS — BP 112/80 | HR 112 | Ht 61.02 in | Wt 238.2 lb

## 2024-09-21 DIAGNOSIS — Z01419 Encounter for gynecological examination (general) (routine) without abnormal findings: Secondary | ICD-10-CM | POA: Insufficient documentation

## 2024-09-21 DIAGNOSIS — R0683 Snoring: Secondary | ICD-10-CM | POA: Diagnosis not present

## 2024-09-21 DIAGNOSIS — Z113 Encounter for screening for infections with a predominantly sexual mode of transmission: Secondary | ICD-10-CM

## 2024-09-21 DIAGNOSIS — Z1331 Encounter for screening for depression: Secondary | ICD-10-CM | POA: Diagnosis not present

## 2024-09-21 DIAGNOSIS — Z23 Encounter for immunization: Secondary | ICD-10-CM

## 2024-09-21 DIAGNOSIS — J352 Hypertrophy of adenoids: Secondary | ICD-10-CM | POA: Diagnosis not present

## 2024-09-21 DIAGNOSIS — N926 Irregular menstruation, unspecified: Secondary | ICD-10-CM

## 2024-09-21 MED ORDER — ETONOGESTREL-ETHINYL ESTRADIOL 0.12-0.015 MG/24HR VA RING: 1.0000 | VAGINAL_RING | VAGINAL | 0 refills | Status: DC

## 2024-09-21 MED ORDER — WEGOVY 0.25 MG/0.5ML ~~LOC~~ SOAJ: 0.2500 mg | SUBCUTANEOUS | 1 refills | Status: DC

## 2024-09-21 NOTE — Patient Instructions (Signed)
 Hi Ymani , I did ask the wygovy rep and medicaid stopped coverage. Keep watching diet and exercise.  I have placed a referral to ENT for the large adenoids  [facility] has refill on the nuva ring It was nice seeing you today Dr. Glennon
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
## 2024-09-22 ENCOUNTER — Other Ambulatory Visit: Payer: Self-pay | Admitting: Obstetrics and Gynecology

## 2024-09-22 LAB — SURESWAB® ADVANCED VAGINITIS PLUS,TMA
C. trachomatis RNA, TMA: NOT DETECTED
CANDIDA SPECIES: NOT DETECTED
Candida glabrata: NOT DETECTED
N. gonorrhoeae RNA, TMA: NOT DETECTED
SURESWAB(R) ADV BACTERIAL VAGINOSIS(BV),TMA: NEGATIVE
TRICHOMONAS VAGINALIS (TV),TMA: NOT DETECTED

## 2024-09-22 NOTE — Telephone Encounter (Signed)
 Med refill request: Wegovy   Last AEX:  09/21/24 Next AEX: not scheduled  Last MMG (if hormonal med) Refill authorized: Please advise - Pharmacy comment: Medicaid does not cover Wegovy  anymore for Obesity. We can switch to Ozempic if patient has type 2 diabetes. Thank you! Medicaid does not cover Wegovy  anymore for Obesity.

## 2024-09-23 ENCOUNTER — Ambulatory Visit: Payer: Self-pay | Admitting: Obstetrics and Gynecology

## 2024-09-23 ENCOUNTER — Other Ambulatory Visit: Payer: MEDICAID

## 2024-09-23 LAB — CYTOLOGY - PAP
Adequacy: ABSENT
Comment: NEGATIVE
Diagnosis: NEGATIVE
High risk HPV: NEGATIVE

## 2024-09-27 ENCOUNTER — Other Ambulatory Visit: Payer: MEDICAID

## 2024-09-27 NOTE — Addendum Note (Signed)
 Addended by: GLENNON ALMARIE POUR on: 09/27/2024 02:28 PM   Modules accepted: Orders

## 2024-09-28 ENCOUNTER — Encounter: Payer: Self-pay | Admitting: Family Medicine

## 2024-09-28 ENCOUNTER — Encounter: Payer: Self-pay | Admitting: Obstetrics and Gynecology

## 2024-09-28 DIAGNOSIS — R7303 Prediabetes: Secondary | ICD-10-CM

## 2024-09-28 LAB — CBC
HCT: 38.1 % (ref 35.0–45.0)
Hemoglobin: 12.2 g/dL (ref 11.7–15.5)
MCH: 27.4 pg (ref 27.0–33.0)
MCHC: 32 g/dL (ref 32.0–36.0)
MCV: 85.4 fL (ref 80.0–100.0)
MPV: 9.3 fL (ref 7.5–12.5)
Platelets: 382 Thousand/uL (ref 140–400)
RBC: 4.46 Million/uL (ref 3.80–5.10)
RDW: 13.8 % (ref 11.0–15.0)
WBC: 9.3 Thousand/uL (ref 3.8–10.8)

## 2024-09-28 LAB — COMPREHENSIVE METABOLIC PANEL WITH GFR
AG Ratio: 1.5 (calc) (ref 1.0–2.5)
ALT: 11 U/L (ref 6–29)
AST: 11 U/L (ref 10–30)
Albumin: 3.6 g/dL (ref 3.6–5.1)
Alkaline phosphatase (APISO): 29 U/L — ABNORMAL LOW (ref 31–125)
BUN: 11 mg/dL (ref 7–25)
CO2: 21 mmol/L (ref 20–32)
Calcium: 8.7 mg/dL (ref 8.6–10.2)
Chloride: 106 mmol/L (ref 98–110)
Creat: 0.61 mg/dL (ref 0.50–0.96)
Globulin: 2.4 g/dL (ref 1.9–3.7)
Glucose, Bld: 161 mg/dL — ABNORMAL HIGH (ref 65–99)
Potassium: 3.9 mmol/L (ref 3.5–5.3)
Sodium: 137 mmol/L (ref 135–146)
Total Bilirubin: 0.3 mg/dL (ref 0.2–1.2)
Total Protein: 6 g/dL — ABNORMAL LOW (ref 6.1–8.1)
eGFR: 130 mL/min/1.73m2 (ref >=60)

## 2024-09-28 LAB — HCG, QUANTITATIVE, PREGNANCY: HCG, Total, QN: <5 m[IU]/mL

## 2024-09-28 LAB — LIPID PANEL
Cholesterol: 182 mg/dL (ref <200)
HDL: 43 mg/dL — ABNORMAL LOW (ref >=50)
LDL Cholesterol (Calc): 112 mg/dL — ABNORMAL HIGH
Non-HDL Cholesterol (Calc): 139 mg/dL — ABNORMAL HIGH (ref <130)
Total CHOL/HDL Ratio: 4.2 (calc) (ref <5.0)
Triglycerides: 153 mg/dL — ABNORMAL HIGH (ref <150)

## 2024-09-28 LAB — RPR: RPR Ser Ql: NONREACTIVE

## 2024-09-28 LAB — HIV ANTIBODY (ROUTINE TESTING W REFLEX)
HIV 1&2 Ab, 4th Generation: NONREACTIVE
HIV FINAL INTERPRETATION: NEGATIVE

## 2024-09-28 LAB — HEMOGLOBIN A1C
Hgb A1c MFr Bld: 5.8 % — ABNORMAL HIGH (ref <5.7)
Mean Plasma Glucose: 120 mg/dL
eAG (mmol/L): 6.6 mmol/L

## 2024-09-28 LAB — HEPATITIS C ANTIBODY: Hepatitis C Ab: NONREACTIVE

## 2024-09-28 LAB — HEPATITIS B SURFACE ANTIGEN: Hepatitis B Surface Ag: NONREACTIVE

## 2024-09-28 LAB — TSH: TSH: 0.82 m[IU]/L

## 2024-09-28 LAB — VITAMIN D 25 HYDROXY (VIT D DEFICIENCY, FRACTURES): Vit D, 25-Hydroxy: 16 ng/mL — ABNORMAL LOW (ref 30–100)

## 2024-09-29 NOTE — Telephone Encounter (Signed)
 Can you ask [facility] if there are any sleep study companies in network with medicaid? If not then we will need to send her to the pulmonary doctor

## 2024-10-03 ENCOUNTER — Ambulatory Visit (INDEPENDENT_AMBULATORY_CARE_PROVIDER_SITE_OTHER): Payer: MEDICAID

## 2024-10-03 ENCOUNTER — Encounter (INDEPENDENT_AMBULATORY_CARE_PROVIDER_SITE_OTHER): Payer: Self-pay

## 2024-10-03 VITALS — BP 104/74 | HR 91 | Ht 61.0 in | Wt 235.0 lb

## 2024-10-03 DIAGNOSIS — J3489 Other specified disorders of nose and nasal sinuses: Secondary | ICD-10-CM

## 2024-10-03 DIAGNOSIS — J351 Hypertrophy of tonsils: Secondary | ICD-10-CM

## 2024-10-03 DIAGNOSIS — J3501 Chronic tonsillitis: Secondary | ICD-10-CM

## 2024-10-03 NOTE — Telephone Encounter (Signed)
Routing to [employer].   Encounter closed

## 2024-10-03 NOTE — Progress Notes (Signed)
 Dear Dr. Glennon, Here is my assessment for our mutual patient, Courtney  Holloway. Thank you for allowing me the opportunity to care for your patient. Please do not hesitate to contact me should you have any other questions. Sincerely, Dr. Hadassah Parody  Otolaryngology Clinic Note Referring provider: Dr. Glennon HPI:   Initial HPI (10/03/2024) Discussed the use of AI scribe software for clinical note transcription with the patient, who gave verbal consent to proceed.  History of Present Illness Courtney  Zonzarae Holloway is a 23 year old female who presents for evaluation of enlarged tonsils and consideration of tonsillectomy.   Recurrent tonsillitis and tonsillar hypertrophy - Frequent episodes of tonsillitis characterized by sore throat, difficulty breathing, and swollen tonsils - Tonsillar swelling during episodes causes significant airway and swallowing difficulties - White spots frequently present on tonsils during episodes - Over-the-counter medications do not provide symptom relief - frequent allergies, difficulty breathing through nose  - loud snoring   Asthma - Mild asthma not currently requiring inhalers - No recent hospitalizations for asthma exacerbations   Independent Review of Additional Tests or Records:  Referral note Almarie Glennon, MD (09/21/24): snores and large adenoids    PMH/Meds/All/SocHx/FamHx/ROS:   Past Medical History:  Diagnosis Date   Anxiety    Asthma    Cesarean delivery delivered 2024   Depression    Diabetes mellitus during pregnancy in second trimester 09/15/2022   DMDD (disruptive mood dysregulation disorder) 12/28/2015   Failed vision screen 11/21/2014   GAD (generalized anxiety disorder)    Gestational diabetes    Headache(784.0)    HSV-1 (herpes simplex virus 1) infection 08/02/2020   Obesity 11/21/2014   Vision abnormalities    wears glasses     Past Surgical History:  Procedure Laterality Date   CESAREAN SECTION N/A 12/23/2022    Procedure: CESAREAN SECTION;  Surgeon: Barbra Lang PARAS, DO;  Location: [facility] LD ORS;  Service: Obstetrics;  Laterality: N/A;   NO PAST SURGERIES      Family History  Problem Relation Age of Onset   Diabetes Mother    Bipolar disorder Mother    Schizophrenia Mother    Multiple sclerosis Mother    Diabetes Father    Bipolar disorder Maternal Grandmother    Alcohol abuse Maternal Grandmother      Social Connections: Moderately Isolated (06/29/2024)   Social Connection and Isolation Panel    Frequency of Communication with Friends and Family: More than three times a week    Frequency of Social Gatherings with Friends and Family: Never    Attends Religious Services: More than 4 times per year    Active Member of [employer] or Organizations: No    Attends Engineer, structural: Not on file    Marital Status: Never married     Current Outpatient Medications  Medication Instructions   escitalopram  (LEXAPRO ) 5 mg, Daily   etonogestrel -ethinyl estradiol  (NUVARING) 0.12-0.015 MG/24HR vaginal ring 1 each, Vaginal, Every 30 days, Insert vaginally and leave in place for 4 consecutive weeks on a continuous basis   guanFACINE  (TENEX ) 1 mg, Daily   ondansetron  (ZOFRAN -ODT) 4 mg, Oral, Every 8 hours PRN   traZODone  (DESYREL ) 50 mg, Oral, At bedtime PRN   Wegovy  0.25 mg, Subcutaneous, Weekly     Physical Exam:   BP 104/74 (BP Location: Left Arm, Patient Position: Sitting)   Pulse 91   Ht 5' 1 (1.549 m)   Wt 235 lb (106.6 kg)   LMP  (LMP Unknown)   SpO2 92%  BMI 44.40 kg/m   Salient findings:  CN II-XII intact  Bilateral EAC clear and TM intact with well pneumatized middle ear spaces No lesions of oral cavity/oropharynx; cryptic tonsils, 3-4+ almost touching No obviously palpable neck masses/lymphadenopathy/thyromegaly No respiratory distress or stridor  Seprately Identifiable Procedures:  Prior to initiating any procedures, risks/benefits/alternatives were explained to the  patient and verbal consent obtained.   Impression & Plans:  Courtney  Holloway is a 23 y.o. female with   1. Chronic tonsillitis   2. Tonsillar hypertrophy   3. Nasal obstruction    Assessment and Plan Assessment & Plan Recurrent tonsillitis with tonsillar hypertrophy Recurrent tonsillitis with significant tonsillar hypertrophy causing discomfort and breathing difficulty. Surgery considered due to recurrent infections and significant discomfort. - Schedule tonsillectomy. We discussed management for this, including observation, antibiotics, and tonsillectomy.  We discussed R/B/A for Tonsillectomy including significant post-op pain, bleeding (3%, including life threatening bleeding and requiring return to OR), and infections (still with pharyngitis) as well as persistent symptoms and risk of anesthesia. We also discussed post-op management and risks.  Patient would like to proceed with Tonsillectomy and possible adenoidectomy   Possible adenoidal hypertrophy Possible adenoidal hypertrophy suspected due to recurrent ear infections and nasal obstruction symptoms. - Evaluate adenoids during tonsillectomy. - Perform adenoidectomy if adenoids are significantly hypertrophied.  We discussed management for this, including observation, antibiotics, and tonsillectomy.  We discussed R/B/A for Tonsillectomy including significant post-op pain, bleeding (3%, including life threatening bleeding and requiring return to OR), and infections (still with pharyngitis) as well as persistent symptoms and risk of anesthesia. We also discussed post-op management and risks.  Patient would like to proceed with Tonsillectomy and possible adenoidectomy    See below regarding exact medications prescribed this encounter including dosages and route: No orders of the defined types were placed in this encounter.   Thank you for allowing me the opportunity to care for your patient. Please do not hesitate to contact me  should you have any other questions.  Sincerely, Hadassah Parody, MD Otolaryngologist (ENT), [facility] Phone: [phone] Fax: [phone]  MDM:  Level 4 Complexity/Problems addressed: mod - chronic illness worsening  Data complexity: limited- independent review of referral - Morbidity: mod - surgery discussion  - Prescription Drug prescribed or managed: no

## 2024-10-05 ENCOUNTER — Encounter: Payer: Self-pay | Admitting: Family Medicine

## 2024-10-07 ENCOUNTER — Ambulatory Visit (INDEPENDENT_AMBULATORY_CARE_PROVIDER_SITE_OTHER): Payer: MEDICAID | Admitting: Family Medicine

## 2024-10-07 VITALS — BP 100/68 | HR 92 | Temp 98.3°F | Ht 61.0 in | Wt 240.4 lb

## 2024-10-07 DIAGNOSIS — F902 Attention-deficit hyperactivity disorder, combined type: Secondary | ICD-10-CM | POA: Diagnosis not present

## 2024-10-07 DIAGNOSIS — J301 Allergic rhinitis due to pollen: Secondary | ICD-10-CM

## 2024-10-07 MED ORDER — METHYLPHENIDATE HCL ER (OSM) 27 MG PO TBCR: 27.0000 mg | EXTENDED_RELEASE_TABLET | ORAL | 0 refills | Status: DC

## 2024-10-07 MED ORDER — LORATADINE 10 MG PO TABS: 10.0000 mg | ORAL_TABLET | Freq: Every day | ORAL | 1 refills | Status: DC

## 2024-10-07 NOTE — Patient Instructions (Signed)
2000 units of vitamin D daily.

## 2024-10-07 NOTE — Progress Notes (Signed)
 Established Patient Office Visit  Subjective   Patient ID: Courtney Holloway, female    DOB: 10-15-2001  Age: 23 y.o. MRN: [id number]  Chief Complaint  Patient presents with   Medication Problem    Wants to talk about medications. Said wherever she got sent for sleep study doesn't take her insurance. Patient states she had an allergy  test done an she doesn't have any allergies that are listed in her MyChart.     HPI  Discussed the use of AI scribe software for clinical note transcription with the patient, who gave verbal consent to proceed.  History of Present Illness   Courtney Holloway is a 23 year old female with ADHD who presents with memory and focus issues.  She experiences difficulty retaining information despite studying and often forgets material during exams. She struggles with procrastination, leaving tasks like dishes and laundry undone. She feels constantly tired and lacks motivation. There is no hyperactivity, anxiety during exams, or suicidal thoughts.  Her ADHD was diagnosed in 2022. She was previously on Concerta , which was beneficial during [employer] school, but discontinued it due to side effects and insurance issues. She is not currently on any ADHD medication.  She reports weight fluctuations, with a recent gain of 5 pounds. Her weight has varied between 224 and 240 pounds over the past few months. I reviewed her labs that were done recently and she has low vitamin D  levels. Her A1c is 5.8, and she is not on medication for this.  She is scheduled for a tonsillectomy in January due to enlarged tonsils, with potential adenoid removal during the procedure. She experiences allergies, particularly to environmental factors like grass and trees, was told she is not allergic to any foods that she was tested for. She is not currently on any allergy  medication.      Current Outpatient Medications  Medication Instructions   escitalopram  (LEXAPRO ) 5 mg, Daily    etonogestrel -ethinyl estradiol  (NUVARING) 0.12-0.015 MG/24HR vaginal ring 1 each, Vaginal, Every 30 days, Insert vaginally and leave in place for 4 consecutive weeks on a continuous basis   loratadine  (CLARITIN ) 10 mg, Oral, Daily   methylphenidate  (CONCERTA ) 27 mg, Oral, BH-each morning   [START ON 11/04/2024] methylphenidate  (CONCERTA ) 27 mg, Oral, BH-each morning   [START ON 12/04/2024] methylphenidate  (CONCERTA ) 27 mg, Oral, BH-each morning   ondansetron  (ZOFRAN -ODT) 4 mg, Oral, Every 8 hours PRN   traZODone  (DESYREL ) 50 mg, Oral, At bedtime PRN    Patient Active Problem List   Diagnosis Date Noted   Dysfunctional uterine bleeding 04/13/2023   Postpartum anemia 04/13/2023   Deficiency anemia 04/03/2023   Status post primary low transverse cesarean section 12/23/2022   BMI 50.0-59.9, adult (HCC) 12/04/2022   HSV (herpes simplex virus) anogenital infection 11/17/2022   Suicidal ideation 10/31/2022   Morbid obesity (HCC) 07/16/2022   Body mass index (BMI) of 39.0 to 39.9 in adult 09/02/2021   Prediabetes 08/04/2021   Nightmares associated with chronic post-traumatic stress disorder 04/01/2021   Anal fissure 03/28/2021   Drug overdose, multiple drugs, undetermined intent, initial encounter 02/02/2021   Attention deficit hyperactivity disorder (ADHD), combined type 01/28/2021   MDD (major depressive disorder), recurrent severe, without psychosis (HCC) 01/16/2021   Dyslipidemia 02/15/2018   Hidradenitis axillaris 07/11/2016   Slow transit constipation 11/21/2014   Asthma 05/04/2013   GAD (generalized anxiety disorder) 11/02/2012   Post-traumatic stress disorder 11/02/2012     Review of Systems  All other systems reviewed and are negative.  Objective:     BP 100/68   Pulse 92   Temp 98.3 F (36.8 C) (Oral)   Ht 5' 1 (1.549 m)   Wt 240 lb 6.4 oz (109 kg)   LMP  (LMP Unknown)   SpO2 97%   BMI 45.42 kg/m    Physical Exam Vitals reviewed.  Constitutional:       Appearance: Normal appearance. She is morbidly obese.  Cardiovascular:     Rate and Rhythm: Normal rate and regular rhythm.     Heart sounds: Normal heart sounds. No murmur heard. Pulmonary:     Effort: Pulmonary effort is normal.     Breath sounds: Normal breath sounds. No wheezing.  Neurological:     Mental Status: She is alert and oriented to person, place, and time. Mental status is at baseline.  Psychiatric:        Mood and Affect: Mood normal. Affect is blunt.        No results found for any visits on 10/07/24.    The ASCVD Risk score (Arnett DK, et al., 2019) failed to calculate for the following reasons:   The 2019 ASCVD risk score is only valid for ages 14 to 50    Assessment & Plan:  Attention deficit hyperactivity disorder (ADHD), combined type -     Methylphenidate  HCl ER (OSM); Take 1 tablet (27 mg total) by mouth every morning.  Dispense: 30 tablet; Refill: 0 -     Methylphenidate  HCl ER (OSM); Take 1 tablet (27 mg total) by mouth every morning.  Dispense: 30 tablet; Refill: 0 -     Methylphenidate  HCl ER (OSM); Take 1 tablet (27 mg total) by mouth every morning.  Dispense: 30 tablet; Refill: 0  Non-seasonal allergic rhinitis due to pollen -     Loratadine ; Take 1 tablet (10 mg total) by mouth daily.  Dispense: 90 tablet; Refill: 1  Morbid obesity (HCC)   Assessment and Plan    Attention-deficit hyperactivity disorder (ADHD) ADHD with symptoms of memory retention issues, procrastination, and lack of focus. Previously managed with Concerta , which was effective but caused nausea. Importance of treating ADHD to manage associated depression and anxiety was discussed. Additional testing for potential learning disorders was considered but decided to first address ADHD with medication. Emphasized the importance of consistent medication use to manage symptoms and improve daily functioning. - Prescribe Concerta  27 mg daily. - Advise to take medication daily, including  weekends. - Schedule follow-up in 3 months for medication review. - Instruct to contact if dosage adjustment is needed before follow-up.  Morbid Obesity Weight gain of approximately 5 pounds. Previous attempts with Wegovy  not covered by insurance. Since patient will be going back on her stimulants this should help suppress appetite.   Prediabetes A1c at 5.8, consistent with prediabetes. Glucose level of 161 postprandial is normal. Emphasized the importance of monitoring sugar intake.  Vitamin D  deficiency Low vitamin D  levels. Protein smoothies do not provide adequate vitamin D . - Recommend over-the-counter vitamin D  2000 IU daily. - Plan to recheck vitamin D  levels in 6 months.  Allergic rhinitis Symptoms of allergic rhinitis with environmental triggers such as grass and trees. Discussed the high cost of Claritin  and potential insurance coverage for loratadine . - Prescribe loratadine  and check insurance coverage. - Advise to purchase generic if not covered by insurance.        Return in about 3 months (around 01/07/2025) for video visit for med refills.    Heron CHRISTELLA Sharper, MD

## 2024-10-22 ENCOUNTER — Telehealth: Payer: MEDICAID | Admitting: Family Medicine

## 2024-10-22 DIAGNOSIS — J069 Acute upper respiratory infection, unspecified: Secondary | ICD-10-CM

## 2024-10-22 MED ORDER — LIDOCAINE VISCOUS HCL 2 % MT SOLN: 10.0000 mL | Freq: Four times a day (QID) | OROMUCOSAL | 0 refills | Status: AC | PRN

## 2024-10-22 MED ORDER — FLUTICASONE PROPIONATE 50 MCG/ACT NA SUSP: 2.0000 | Freq: Every day | NASAL | 0 refills | Status: DC

## 2024-10-22 NOTE — Progress Notes (Signed)

## 2024-11-02 ENCOUNTER — Encounter: Payer: Self-pay | Admitting: Oncology
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
## 2024-11-09 ENCOUNTER — Ambulatory Visit (HOSPITAL_COMMUNITY): Payer: MEDICAID | Admitting: Psychiatry

## 2024-11-09 ENCOUNTER — Encounter: Payer: MEDICAID | Admitting: Dietician

## 2024-11-09 VITALS — BP 110/75 | Wt 245.0 lb

## 2024-11-09 DIAGNOSIS — F411 Generalized anxiety disorder: Secondary | ICD-10-CM | POA: Diagnosis not present

## 2024-11-09 DIAGNOSIS — F332 Major depressive disorder, recurrent severe without psychotic features: Secondary | ICD-10-CM | POA: Diagnosis not present

## 2024-11-09 MED ORDER — FLUOXETINE HCL 10 MG PO CAPS: 10.0000 mg | ORAL_CAPSULE | Freq: Every day | ORAL | 0 refills | Status: AC

## 2024-11-09 MED ORDER — HYDROXYZINE HCL 10 MG PO TABS: 10.0000 mg | ORAL_TABLET | Freq: Three times a day (TID) | ORAL | 0 refills | Status: AC | PRN

## 2024-11-28 ENCOUNTER — Encounter (INDEPENDENT_AMBULATORY_CARE_PROVIDER_SITE_OTHER): Payer: Self-pay

## 2024-12-01 ENCOUNTER — Telehealth (HOSPITAL_COMMUNITY): Payer: Self-pay

## 2024-12-01 NOTE — Telephone Encounter (Signed)
 Patient is calling because she wants a letter for an emotional support animal - she does not currently have one but wants to get a dog. Please review and advise, thank you

## 2024-12-01 NOTE — Telephone Encounter (Signed)
 I called the patient and left a voicemail.

## 2024-12-11 ENCOUNTER — Emergency Department (HOSPITAL_COMMUNITY): Payer: MEDICAID

## 2024-12-11 ENCOUNTER — Emergency Department (HOSPITAL_COMMUNITY)
Admission: EM | Admit: 2024-12-11 | Discharge: 2024-12-11 | Discharge status: Against Medical Advice | Payer: MEDICAID | Attending: Emergency Medicine | Admitting: Emergency Medicine

## 2024-12-11 ENCOUNTER — Other Ambulatory Visit: Payer: Self-pay

## 2024-12-11 ENCOUNTER — Telehealth: Payer: MEDICAID | Admitting: Family

## 2024-12-11 DIAGNOSIS — W1830XA Fall on same level, unspecified, initial encounter: Secondary | ICD-10-CM | POA: Insufficient documentation

## 2024-12-11 DIAGNOSIS — M542 Cervicalgia: Secondary | ICD-10-CM

## 2024-12-11 DIAGNOSIS — Z5321 Procedure and treatment not carried out due to patient leaving prior to being seen by health care provider: Secondary | ICD-10-CM | POA: Diagnosis not present

## 2024-12-11 DIAGNOSIS — M549 Dorsalgia, unspecified: Secondary | ICD-10-CM | POA: Diagnosis not present

## 2024-12-11 MED ORDER — BACLOFEN 10 MG PO TABS: 10.0000 mg | ORAL_TABLET | Freq: Three times a day (TID) | ORAL | 0 refills | Status: AC

## 2024-12-11 NOTE — ED Provider Triage Note (Signed)
 Emergency Medicine Provider Triage Evaluation Note  Courtney  Adrine Holloway , a 23 y.o. female  was evaluated in triage.  Pt complains of neck pain, right sided, radiates to right hand. Started after fall yesterday, had an e visit today and was sent for imaging  Review of Systems  Positive: Neck pain Negative: Head ache, fever  Physical Exam  BP 125/83 (BP Location: Left Arm)   Pulse (!) 101   Temp 98.1 F (36.7 C) (Oral)   Resp 18   Ht 5' 1 (1.549 m)   SpO2 97%   BMI 46.29 kg/m  Gen:   Awake, no distress   Resp:  Normal effort  MSK:   Moves extremities without difficulty  Other:  No midline tenderness step off or defmoritiy, normal ROM of neck, upper ext sensation and strength intact  Medical Decision Making  Medically screening exam initiated at 9:14 AM.  Appropriate orders placed.  Kyle  Zonzarae Kush was informed that the remainder of the evaluation will be completed by another provider, this initial triage assessment does not replace that evaluation, and the importance of remaining in the ED until their evaluation is complete.     Shermon Warren SAILOR, [place] 12/11/24 [id number]

## 2024-12-11 NOTE — ED Triage Notes (Signed)
 Pt ambulatory to triage with complaints of back and neck pain after falling on her back yesterday. Pt states that she thought the pain would improve after rest, but it has not. Pt overall feels sore.

## 2024-12-11 NOTE — Progress Notes (Signed)
 We are sorry that you are not feeling well.  Here is how we plan to help!  Based on what you have shared with me it, appears you may be experiencing acute back pain.   Acute back pain is defined as musculoskeletal pain that can resolve in 1-3 weeks with conservative treatment.  I have prescribed a muscle relaxant, Baclofen  10 mg every eight hours as needed. Some patients experience stomach irritation or in increased heartburn with anti-inflammatory drugs.  Please keep in mind that muscle relaxer's can cause fatigue and should not be taken while at work or driving.  Back pain is very common.  The pain often gets better over time.  The cause of back pain is usually not dangerous.  Most people can learn to manage their back pain on their own.  Home Care Stay active.  Start with short walks on flat ground if you can.  Try to walk farther each day. Do not sit, drive or stand in one place for more than 30 minutes.  Do not stay in bed. Do not fully avoid exercise or work.  Activity can help your back heal faster. Be careful when you bend or lift an object.  Bend at your knees, keep the object close to you, and do not twist. Sleep on a firm mattress.  Lie on your side, and bend your knees.  If you lie on your back, put a pillow under your knees. Only take medicines as told by your doctor. Put ice on the injured area. Put ice in a plastic bag Place a towel between your skin and the bag Leave the ice on for 15-20 minutes, 3-4 times a day for the first 2-3 days.  After that, you can switch between ice and heat packs. Ask your doctor about back exercises or massage.   Get Help Right Way If: Your pain does not go away with rest and treatments given today. Your pain does not go away within 1 week. You have new problems. You do not feel well. The pain spreads into your legs. You cannot control when you poop (bowel movement) or pee (urinate). You feel sick to your stomach (nauseous) or throw up  (vomit). You have belly (abdominal) pain. You feel like you may pass out (faint). If you develop a fever.  Make Sure you: Understand these instructions. Continue to monitor your condition for any changes. Will get help right away if you are not doing well or get worse.  Your e-visit answers were reviewed by a board certified advanced clinical practitioner to complete your personal care plan.  Depending on the condition, your plan could have included both over the counter or prescription medications.  If there is a problem, please reply once you have received a response from your provider.  Your safety is important to us .  If you have drug allergies, check your prescription carefully.    You can use MyChart to ask questions about todays visit, request a non-urgent call back, or ask for a work or school excuse for 24 hours related to this e-Visit. If it has been greater than 24 hours you will need to follow up with your provider or enter a new e-Visit to address those concerns.  You will get an e-mail in the next two days asking about your experience.  I hope that your e-visit has been valuable and will speed your recovery. Thank you for using e-visits.   I have spent 5 minutes in review of e-visit questionnaire,  review and updating patient chart, medical decision making and response to patient.   Bari Learn, FNP

## 2024-12-13 ENCOUNTER — Other Ambulatory Visit: Payer: Self-pay

## 2024-12-13 ENCOUNTER — Encounter (HOSPITAL_BASED_OUTPATIENT_CLINIC_OR_DEPARTMENT_OTHER): Payer: Self-pay

## 2024-12-14 ENCOUNTER — Other Ambulatory Visit: Payer: Self-pay | Admitting: Obstetrics and Gynecology

## 2024-12-14 NOTE — Telephone Encounter (Signed)
 Med refill request:   etonogestrel -ethinyl estradiol  (NUVARING) 0.12-0.015 MG/24HR vaginal ring  Start:  09/21/24 Disp: 4 each Refills:  0  Last AEX:  09/21/24 Next AEX:  Not yet scheduled Last MMG (if hormonal med):  N/A Refill authorized? Please Advise.

## 2024-12-16 ENCOUNTER — Encounter: Payer: MEDICAID | Admitting: Dietician
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
## 2024-12-19 NOTE — Anesthesia Preprocedure Evaluation (Addendum)
"                                    Anesthesia Evaluation  Patient identified by MRN, date of birth, ID band Patient awake    Reviewed: Allergy  & Precautions, NPO status , Patient's Chart, lab work & pertinent test results  History of Anesthesia Complications Negative for: history of anesthetic complications  Airway Mallampati: I  TM Distance: >3 FB Neck ROM: Full    Dental no notable dental hx.    Pulmonary asthma , former smoker   Pulmonary exam normal        Cardiovascular negative cardio ROS Normal cardiovascular exam     Neuro/Psych  Headaches  Anxiety Depression       GI/Hepatic negative GI ROS, Neg liver ROS,,,  Endo/Other  diabetes  Class 3 obesity  Renal/GU negative Renal ROS     Musculoskeletal negative musculoskeletal ROS (+)    Abdominal   Peds  Hematology negative hematology ROS (+)   Anesthesia Other Findings Chronic tonsillitis  Reproductive/Obstetrics                              Anesthesia Physical Anesthesia Plan  ASA: 3  Anesthesia Plan: General   Post-op Pain Management: Tylenol  PO (pre-op)*   Induction: Intravenous  PONV Risk Score and Plan: 3 and Treatment may vary due to age or medical condition, Ondansetron , Dexamethasone , Midazolam  and Scopolamine  patch - Pre-op  Airway Management Planned: Oral ETT  Additional Equipment: None  Intra-op Plan:   Post-operative Plan: Extubation in OR  Informed Consent: I have reviewed the patients History and Physical, chart, labs and discussed the procedure including the risks, benefits and alternatives for the proposed anesthesia with the patient or authorized representative who has indicated his/her understanding and acceptance.     Dental advisory given  Plan Discussed with: CRNA  Anesthesia Plan Comments:          Anesthesia Quick Evaluation  "

## 2024-12-20 ENCOUNTER — Ambulatory Visit (HOSPITAL_BASED_OUTPATIENT_CLINIC_OR_DEPARTMENT_OTHER): Payer: MEDICAID | Admitting: Anesthesiology

## 2024-12-20 ENCOUNTER — Encounter (HOSPITAL_BASED_OUTPATIENT_CLINIC_OR_DEPARTMENT_OTHER): Admission: RE | Disposition: A | Discharge status: Home / Self Care | Payer: Self-pay | Source: Home / Self Care

## 2024-12-20 ENCOUNTER — Ambulatory Visit (HOSPITAL_BASED_OUTPATIENT_CLINIC_OR_DEPARTMENT_OTHER)
Admission: RE | Admit: 2024-12-20 | Discharge: 2024-12-20 | Disposition: A | Discharge status: Home / Self Care | Payer: MEDICAID

## 2024-12-20 ENCOUNTER — Encounter (HOSPITAL_BASED_OUTPATIENT_CLINIC_OR_DEPARTMENT_OTHER): Payer: Self-pay

## 2024-12-20 ENCOUNTER — Other Ambulatory Visit: Payer: Self-pay

## 2024-12-20 DIAGNOSIS — E66813 Obesity, class 3: Secondary | ICD-10-CM | POA: Insufficient documentation

## 2024-12-20 DIAGNOSIS — J3501 Chronic tonsillitis: Secondary | ICD-10-CM | POA: Diagnosis not present

## 2024-12-20 DIAGNOSIS — Z87891 Personal history of nicotine dependence: Secondary | ICD-10-CM | POA: Diagnosis not present

## 2024-12-20 DIAGNOSIS — R599 Enlarged lymph nodes, unspecified: Secondary | ICD-10-CM | POA: Diagnosis not present

## 2024-12-20 DIAGNOSIS — J45909 Unspecified asthma, uncomplicated: Secondary | ICD-10-CM | POA: Insufficient documentation

## 2024-12-20 DIAGNOSIS — E785 Hyperlipidemia, unspecified: Secondary | ICD-10-CM

## 2024-12-20 DIAGNOSIS — F418 Other specified anxiety disorders: Secondary | ICD-10-CM

## 2024-12-20 DIAGNOSIS — E119 Type 2 diabetes mellitus without complications: Secondary | ICD-10-CM | POA: Diagnosis not present

## 2024-12-20 DIAGNOSIS — Z6841 Body Mass Index (BMI) 40.0 and over, adult: Secondary | ICD-10-CM | POA: Diagnosis not present

## 2024-12-20 DIAGNOSIS — F32A Depression, unspecified: Secondary | ICD-10-CM | POA: Insufficient documentation

## 2024-12-20 DIAGNOSIS — Z01818 Encounter for other preprocedural examination: Secondary | ICD-10-CM

## 2024-12-20 DIAGNOSIS — F419 Anxiety disorder, unspecified: Secondary | ICD-10-CM | POA: Insufficient documentation

## 2024-12-20 HISTORY — PX: TONSILLECTOMY AND ADENOIDECTOMY: SHX28

## 2024-12-20 LAB — POCT PREGNANCY, URINE: Preg Test, Ur: NEGATIVE

## 2024-12-20 SURGERY — TONSILLECTOMY AND ADENOIDECTOMY
Anesthesia: General | Site: Throat | Laterality: Bilateral

## 2024-12-20 MED ORDER — OXYCODONE HCL 5 MG PO TABS
ORAL_TABLET | ORAL | Status: AC
  Filled 2024-12-20: qty 1

## 2024-12-20 MED ORDER — SCOPOLAMINE 1 MG/3DAYS TD PT72
MEDICATED_PATCH | TRANSDERMAL | Status: AC
  Filled 2024-12-20: qty 1

## 2024-12-20 MED ORDER — LACTATED RINGERS IV SOLN: INTRAVENOUS | Status: DC

## 2024-12-20 MED ORDER — MIDAZOLAM HCL 5 MG/5ML IJ SOLN
INTRAMUSCULAR | Status: DC | PRN
  Administered 2024-12-20: 2 mg via INTRAVENOUS

## 2024-12-20 MED ORDER — DROPERIDOL 2.5 MG/ML IJ SOLN: 0.6250 mg | Freq: Once | INTRAMUSCULAR | Status: DC | PRN

## 2024-12-20 MED ORDER — OXYCODONE HCL 5 MG PO TABS: 5.0000 mg | ORAL_TABLET | Freq: Once | ORAL | Status: AC | PRN

## 2024-12-20 MED ORDER — ROCURONIUM BROMIDE 100 MG/10ML IV SOLN
INTRAVENOUS | Status: DC | PRN
  Administered 2024-12-20: 40 mg via INTRAVENOUS

## 2024-12-20 MED ORDER — SCOPOLAMINE 1 MG/3DAYS TD PT72
1.0000 | MEDICATED_PATCH | TRANSDERMAL | Status: DC
  Administered 2024-12-20: 1 mg via TRANSDERMAL

## 2024-12-20 MED ORDER — OXYCODONE HCL 5 MG/5ML PO SOLN
5.0000 mg | Freq: Once | ORAL | Status: AC | PRN
  Administered 2024-12-20: 5 mg via ORAL

## 2024-12-20 MED ORDER — OXYCODONE HCL 5 MG PO TABS
5.0000 mg | ORAL_TABLET | Freq: Once | ORAL | Status: AC
  Administered 2024-12-20: 5 mg via ORAL

## 2024-12-20 MED ORDER — ACETAMINOPHEN 500 MG PO TABS: 1000.0000 mg | ORAL_TABLET | Freq: Four times a day (QID) | ORAL | Status: AC

## 2024-12-20 MED ORDER — ACETAMINOPHEN 500 MG PO TABS
1000.0000 mg | ORAL_TABLET | Freq: Once | ORAL | Status: AC
  Administered 2024-12-20: 1000 mg via ORAL

## 2024-12-20 MED ORDER — ACETAMINOPHEN 10 MG/ML IV SOLN: 1000.0000 mg | Freq: Once | INTRAVENOUS | Status: DC

## 2024-12-20 MED ORDER — ACETAMINOPHEN 500 MG PO TABS: 1000.0000 mg | ORAL_TABLET | Freq: Once | ORAL | Status: DC

## 2024-12-20 MED ORDER — FENTANYL CITRATE (PF) 100 MCG/2ML IJ SOLN
INTRAMUSCULAR | Status: DC | PRN
  Administered 2024-12-20: 100 ug via INTRAVENOUS

## 2024-12-20 MED ORDER — ACETAMINOPHEN 500 MG PO TABS
ORAL_TABLET | ORAL | Status: AC
  Filled 2024-12-20: qty 2

## 2024-12-20 MED ORDER — ACETAMINOPHEN 10 MG/ML IV SOLN
INTRAVENOUS | Status: AC
  Filled 2024-12-20: qty 100

## 2024-12-20 MED ORDER — FENTANYL CITRATE (PF) 100 MCG/2ML IJ SOLN
INTRAMUSCULAR | Status: AC
  Filled 2024-12-20: qty 2

## 2024-12-20 MED ORDER — DEXAMETHASONE SODIUM PHOSPHATE 4 MG/ML IJ SOLN
INTRAMUSCULAR | Status: DC | PRN
  Administered 2024-12-20: 5 mg via INTRAVENOUS

## 2024-12-20 MED ORDER — OXYCODONE HCL 5 MG PO TABS: 5.0000 mg | ORAL_TABLET | Freq: Four times a day (QID) | ORAL | 0 refills | Status: AC | PRN

## 2024-12-20 MED ORDER — DROPERIDOL 2.5 MG/ML IJ SOLN
INTRAMUSCULAR | Status: DC | PRN
  Administered 2024-12-20: .625 mg via INTRAVENOUS

## 2024-12-20 MED ORDER — IBUPROFEN 200 MG PO TABS: 600.0000 mg | ORAL_TABLET | Freq: Four times a day (QID) | ORAL | Status: DC

## 2024-12-20 MED ORDER — OXYCODONE HCL 5 MG/5ML PO SOLN
ORAL | Status: AC
  Filled 2024-12-20: qty 5

## 2024-12-20 MED ORDER — LIDOCAINE HCL (CARDIAC) PF 100 MG/5ML IV SOSY
PREFILLED_SYRINGE | INTRAVENOUS | Status: DC | PRN
  Administered 2024-12-20: 100 mg via INTRAVENOUS

## 2024-12-20 MED ORDER — FENTANYL CITRATE (PF) 100 MCG/2ML IJ SOLN
25.0000 ug | INTRAMUSCULAR | Status: DC | PRN
  Administered 2024-12-20: 25 ug via INTRAVENOUS

## 2024-12-20 MED ORDER — ONDANSETRON HCL 4 MG PO TABS: 4.0000 mg | ORAL_TABLET | Freq: Every day | ORAL | 0 refills | Status: AC | PRN

## 2024-12-20 MED ORDER — PROPOFOL 10 MG/ML IV BOLUS
INTRAVENOUS | Status: DC | PRN
  Administered 2024-12-20: 200 mg via INTRAVENOUS

## 2024-12-20 MED ORDER — SUGAMMADEX SODIUM 200 MG/2ML IV SOLN
INTRAVENOUS | Status: DC | PRN
  Administered 2024-12-20: 400 mg via INTRAVENOUS

## 2024-12-20 MED ORDER — 0.9 % SODIUM CHLORIDE (POUR BTL) OPTIME
TOPICAL | Status: DC | PRN
  Administered 2024-12-20: 1000 mL

## 2024-12-20 SURGICAL SUPPLY — 31 items
CANISTER SUCT 1200ML W/VALVE (MISCELLANEOUS) ×1 IMPLANT
CATH ROBINSON RED A/P 10FR (CATHETERS) IMPLANT
CATH ROBINSON RED A/P 12FR (CATHETERS) ×1 IMPLANT
CLEANER CAUTERY TIP PAD (MISCELLANEOUS) ×1 IMPLANT
COAGULATOR SUCT SWTCH 10FR 6 (ELECTROSURGICAL) ×1 IMPLANT
CORD BIPOLAR FORCEPS 12FT (ELECTRODE) IMPLANT
COVER BACK TABLE 60X90IN (DRAPES) ×1 IMPLANT
COVER MAYO STAND STRL (DRAPES) ×1 IMPLANT
DEFOGGER MIRROR 1QT (MISCELLANEOUS) ×1 IMPLANT
ELECT COATED BLADE 2.86 ST (ELECTRODE) ×1 IMPLANT
ELECTRODE REM PT RETRN 9FT PED (ELECTROSURGICAL) IMPLANT
ELECTRODE REM PT RTRN 9FT ADLT (ELECTROSURGICAL) IMPLANT
FORCEPS BIPOLAR SPETZLER 8 1.0 (NEUROSURGERY SUPPLIES) IMPLANT
GAUZE SPONGE 4X4 12PLY STRL LF (GAUZE/BANDAGES/DRESSINGS) ×2 IMPLANT
GLOVE BIO SURGEON STRL SZ 6 (GLOVE) ×1 IMPLANT
GOWN STRL REUS W/ TWL LRG LVL3 (GOWN DISPOSABLE) ×1 IMPLANT
GOWN STRL SURGICAL XL XLNG (GOWN DISPOSABLE) ×1 IMPLANT
MANIFOLD NEPTUNE II (INSTRUMENTS) ×1 IMPLANT
MARKER SKIN DUAL TIP RULER LAB (MISCELLANEOUS) IMPLANT
NS IRRIG 500ML POUR BTL (IV SOLUTION) ×1 IMPLANT
PENCIL SMOKE EVACUATOR (MISCELLANEOUS) ×1 IMPLANT
SHEET MEDIUM DRAPE 40X70 STRL (DRAPES) ×1 IMPLANT
SLEEVE SCD COMPRESS KNEE MED (STOCKING) IMPLANT
SPONGE TONSIL 1 RF SGL (DISPOSABLE) IMPLANT
SPONGE TONSIL 1.25 RF SGL STRG (GAUZE/BANDAGES/DRESSINGS) IMPLANT
SYR BULB EAR ULCER 3OZ GRN STR (SYRINGE) ×1 IMPLANT
TOWEL GREEN STERILE FF (TOWEL DISPOSABLE) ×1 IMPLANT
TUBE CONNECTING 20X1/4 (TUBING) ×1 IMPLANT
TUBE SALEM SUMP 12FR 48 (TUBING) IMPLANT
TUBE SALEM SUMP 16F (TUBING) IMPLANT
YANKAUER SUCT BULB TIP NO VENT (SUCTIONS) ×1 IMPLANT

## 2024-12-20 NOTE — Anesthesia Postprocedure Evaluation (Signed)
"   Anesthesia Post Note  Patient: Courtney  Zonzarae Holloway  Procedure(s) Performed: TONSILLECTOMY AND ADENOIDECTOMY (Bilateral: Throat)     Patient location during evaluation: PACU Anesthesia Type: General Level of consciousness: awake and alert Pain management: pain level controlled Vital Signs Assessment: post-procedure vital signs reviewed and stable Respiratory status: spontaneous breathing, nonlabored ventilation and respiratory function stable Cardiovascular status: blood pressure returned to baseline Postop Assessment: no apparent nausea or vomiting Anesthetic complications: no   No notable events documented.  Last Vitals:  Vitals:   12/20/24 1045 12/20/24 1117  BP: 131/84 (!) 134/91  Pulse: 68 86  Resp: 13 16  Temp:  36.5 C  SpO2: 94% 95%                Vertell Row      "

## 2024-12-20 NOTE — Anesthesia Procedure Notes (Signed)
 Procedure Name: Intubation Date/Time: 12/20/2024 9:45 AM  Performed by: Burnard Rosaline HERO, CRNAPre-anesthesia Checklist: Patient identified, Emergency Drugs available, Suction available and Patient being monitored Patient Re-evaluated:Patient Re-evaluated prior to induction Oxygen Delivery Method: Circle system utilized Preoxygenation: Pre-oxygenation with 100% oxygen Induction Type: IV induction Ventilation: Mask ventilation without difficulty Laryngoscope Size: Mac and 4 Tube type: Oral Tube size: 7.0 mm Number of attempts: 1 Airway Equipment and Method: Stylet and Oral airway Placement Confirmation: ETT inserted through vocal cords under direct vision, positive ETCO2, breath sounds checked- equal and bilateral and CO2 detector Secured at: 23 cm Tube secured with: Tape Dental Injury: Teeth and Oropharynx as per pre-operative assessment

## 2024-12-20 NOTE — Progress Notes (Signed)
 Time last received oxycodone  written on dc paperwork.

## 2024-12-20 NOTE — Discharge Instructions (Addendum)
 Tonsillectomy Discharge Instructions ENT Office Contact Info: Otolaryngology Nursing Triage (Monday-Friday daytime working hours or for emergencies after hours) [phone]  Effects of Anesthesia Tonsillectomy (with or without Adenoidectomy) involves a brief anesthesia, typically 20 - 60 minutes. Patients may be quite irritable for several hours after surgery. If sedatives were given, some patients will remain sleepy for much of the day. Nausea and vomiting is occasionally seen, and usually resolves by the evening of surgery - even without additional medications.  Medications Tonsillectomy is a painful procedure. Pain medications help but do not completely alleviate the discomfort.  For pain, ALTERNATE between Tylenol  and Motrin  and give a dose every 3 hours (i.e. Tylenol  given at 12pm, then Motrin  at 3pm then Tylenol  at 6pm).  Please take 1000 mg of Tylenol  and 600 mg of Ibuprofen  every 6 hours. Narcotic pain medication will also be prescribed for severe pain. Please do not mix with any other narcotic medications or drive while taking narcotics. For adults, do not take more than 4000mg  Tylenol  over a 24 hour period. If you have liver disease, this amount should be less than 2000mg   Activity  Vigorous exercise should be avoided for 14 days after surgery. Baths and showers are fine. Many patients have reduced energy levels until their pain decrease.  You should not travel out of the local area for a full 2 weeks after surgery in case you experience bleeding after surgery.   Eating & Drinking Dehydration is the biggest enemy in the recovery period. It will increase the pain, increase the risk of bleeding and delay the healing. It usually happens because the pain of swallowing keeps the patient from drinking enough liquids. The only drinks to avoid are citrus like orange and grapefruit juices because they will burn the back of the throat. Incentive charts with prizes work very well to get young  children to drink fluids and take their medications after surgery. Some patients will have a small amount of liquid come out of their nose when they drink after surgery, this should stop within a few weeks after surgery. Although drinking is more important, eating is fine even the day of surgery but avoid foods that are crunchy or have sharp edges for 10 days. Dairy products may be taken, if desired. You should avoid acidic, salty and spicy foods (especially tomato sauces). Almost everyone loses some weight after tonsillectomy (which is usually regained in the 2nd or 3rd week after surgery).  Drinking is far more important that eating in the first 14 days after surgery, so concentrate on that first and foremost. Adequate liquid intake probably speeds recovery.  Other things  If the tonsils and adenoids are very large, the patient's voice may change after surgery.  The recovery from tonsillectomy is a very painful period, often the worst pain people can recall, so please be understanding and patient with yourself, or the patient you are caring for. It is helpful to take pain medicine during the night if the patient awakens-- the worst pain is usually in the morning. The pain may seem to increase 2-5 days after surgery -this is normal when inflammation sets in. Please be aware that no combination of medicines will eliminate the pain - the patient will need to continue eating/drinking in spite of the remaining discomfort.  What should we expect after surgery? As previously mentioned, most patients have a significant amount of pain after tonsillectomy, with pain resolving 7-14 days after surgery. Older children and adults seem to have more discomfort. Most  patients can go home the day of surgery.  Ear pain: Many people will complain of earaches after tonsillectomy. This is normal and should resolve. Give pain medications and encourage liquid intake.  Fever: Many patients have a low-grade fever after  tonsillectomy - up to 101.5 degrees (380 C.) for several days. Higher prolonged fever should be reported to your surgeon.  Bad looking (and bad smelling) throat: After surgery, the place where the tonsils were removed is covered with a white film, which is a moist scab. This usually develops 3-5 days after surgery and falls off 10-14 days after surgery and usually causes bad breath. There will be some redness and swelling as well. The uvula (the part of the throat that hangs down in the middle between the tonsils) is usually swollen for several days after surgery.  Sore/bruised feeling of Tongue: This is common for the first few days after surgery because the tongue is pushed out of the way to take out the tonsils in surgery.  When should we call the doctor?  Nausea/Vomiting: This is a common side effect from General Anesthesia and can last up to 24-36 hours after surgery. Try giving sips of clear liquids like Sprite, water  or apple juice then gradually increase fluid intake. If the nausea or vomiting continues beyond this time frame, call the doctor's office for medications that will help relieve the nausea and vomiting.  Bleeding: Significant bleeding is rare, but it happens to about 5% of patients who have tonsillectomy. It may come from the nose, the mouth, or be vomited or coughed up. Ice water  mouthwashes may help stop or reduce bleeding. Please call our office or go to the ED for any bleeding from nose or mouth.  Dehydration: If there has been little or no liquids intake for 24 hours, the patient may need to come to the hospital for IV fluids. Signs of dehydration include lethargy, the lack of tears when crying, and reduced or very concentrated urine output.  High Fever: If the patient has a consistent temperatures greater than 102, or when accompanied by cough or difficulty breathing, you should call the doctor's office.   Post Anesthesia Home Care Instructions  Activity: Get plenty of rest for  the remainder of the day. A responsible individual must stay with you for 24 hours following the procedure.  For the next 24 hours, DO NOT: -Drive a car -Advertising copywriter -Drink alcoholic beverages -Take any medication unless instructed by your physician -Make any legal decisions or sign important papers.  Meals: Start with liquid foods such as gelatin or soup. Progress to regular foods as tolerated. Avoid greasy, spicy, heavy foods. If nausea and/or vomiting occur, drink only clear liquids until the nausea and/or vomiting subsides. Call your physician if vomiting continues.  Special Instructions/Symptoms: Your throat may feel dry or sore from the anesthesia or the breathing tube placed in your throat during surgery. If this causes discomfort, gargle with warm salt water . The discomfort should disappear within 24 hours.  If you had a scopolamine  patch placed behind your ear for the management of post- operative nausea and/or vomiting:  1. The medication in the patch is effective for 72 hours, after which it should be removed.  Wrap patch in a tissue and discard in the trash. Wash hands thoroughly with soap and water . 2. You may remove the patch earlier than 72 hours if you experience unpleasant side effects which may include dry mouth, dizziness or visual disturbances. 3. Avoid touching the patch.  Wash your hands with soap and water  after contact with the patch.

## 2024-12-20 NOTE — Op Note (Signed)
 Otolaryngology Operative note  Courtney Holloway Date/Time of Admission: 12/20/2024  8:00 AM  CSN: [id number];MRN:[id number]  DOB: 08-14-2001 Age: 24 y.o. Location: [facility]    Pre-Op Diagnosis: Chronic tonsillitis  Post-Op Diagnosis: Chronic tonsillitis  Procedure: Procedures: Bilateral tonsillectomy and adenoidectomy - CPT [phone]  Surgeon: Hadassah Parody, MD  Anesthesia type:  General  Anesthesiologist: Anesthesiologist: Paul Lamarr BRAVO, MD CRNA: Burnard Rosaline HERO, CRNA   Staff: Circulator: Jorja Arland HERO, RN; Eliberto Geroge HERO, RN Scrub Person: Alto Pfeiffer J  Implants: * No implants in log *  Specimens: ID Type Source Tests Collected by Time Destination  1 : Right Tonsil Tissue PATH Other SURGICAL PATHOLOGY Parody Hadassah BROCKS, MD 12/20/2024 [phone]   2 : Left Tonsil Tissue PATH Other SURGICAL PATHOLOGY Bhavin Monjaraz, Hadassah BROCKS, MD 12/20/2024 0950     EBL:  25 mL  Drains: none  Post-op disposition and condition: PACU, hemodynamically stable   FINDINGS:  60% adenoid tissue obstructing the choana  Tonsils: exophytic, cryptic 3+ chronically inflamed   Complications: None apparent  Indications and consent:  Courtney Holloway is a 24 y.o. female with diagnoses above. The patient's options were discussed, including risks/benefits/alternatives for each option. Patient expressed understanding, and despite these risks, consented and decided to proceed with above procedures. Informed consent was signed before proceeding.  OPERATIVE DETAILS:  After being properly identified in the preoperative holding area, the patient was brought into the operating suite. Patient was placed supine on the operating table. A pre-procedural time-out was performed and the patient was intubated.   The bed was then turned 90 degrees and the patient was prepped and draped in the usual fashion for adenotonsillectomy. Intraoperative steroids were administered. The mouth was held  open in suspension using a Crowe-Davis mouth gag and Mayo stand. There was no evidence of submucosal cleft palate. The left tonsil was grasped using an Allis and removed in a capsular plane using the protected spatula tip Bovie. The right tonsil was removed in a similar fashion. Small areas of bleeding and visible blood vessels were coagulated to achieve hemostasis. The palate was then retracted with a flexible catheter and the adenoids were carefully removed using a suction bovie device on a setting of 35 coagulate. The operative field was then irrigated and meticulously checked for hemostasis. The Crowe-Davis was released and a flexible suction was used to remove stomach and oropharynx contents. The patient was resuspended and the tonsillar fossae were rechecked for hemostasis. There was no bleeding. The patient tolerated the procedure well.   With the surgical portion of the procedure complete, all instrumentation was then removed from the operative field. The patient's skin was cleaned.  She was returned to the care of the anesthesia team.  She was then weaned from his anesthetic and transported to the PACU in stable condition.  COMPLICATIONS:  None apparent   CONDITION: Stable, transferred to PACU.

## 2024-12-20 NOTE — H&P (Signed)
 Pre-Operative H&P - Day Of Surgery Patient Name: Courtney Holloway  Zonzarae Ethridge Date:   12/20/2024  HPI: Courtney Holloway  is a 24 y.o. female who presents today for operative treatment of chronic tonsillitis. Patient denies recent significant changes to health or significant new medications or physiologic change in condition which would immediately impact plans. No new types of therapy has been initiated that would change the plan or the appropriateness of the plan.   ROS:  A complete review of systems was obtained and is otherwise negative.   PMH:  Past Medical History:  Diagnosis Date   Anxiety    Asthma    Cesarean delivery delivered 2024   Depression    Diabetes mellitus during pregnancy in second trimester 09/15/2022   DMDD (disruptive mood dysregulation disorder) 12/28/2015   Failed vision screen 11/21/2014   GAD (generalized anxiety disorder)    Gestational diabetes    Headache(784.0)    HSV-1 (herpes simplex virus 1) infection 08/02/2020   Obesity 11/21/2014   Vision abnormalities    wears glasses    PSH:  Past Surgical History:  Procedure Laterality Date   CESAREAN SECTION N/A 12/23/2022   Procedure: CESAREAN SECTION;  Surgeon: Barbra Lang PARAS, DO;  Location: [facility] LD [facility]S;  Service: Obstetrics;  Laterality: N/A;   NO PAST SURGERIES      MEDS:  Current Medications[1]  ALLERGIES: Other, Pineapple, Shellfish allergy , Fish protein-containing drug products, and Zofran  [ondansetron  hcl]  EXAM: Vitals: BP 120/80   Pulse 91   Temp 97.9 F (36.6 C) (Temporal)   Ht 5' 1 (1.549 m)   Wt 113.5 kg   LMP 12/11/2024   SpO2 97%   BMI 47.28 kg/m   General Awake, at baseline alertness.   HEENT Globe position appears normal.  External ears normal.  Nose patent without rhinorrhea.  No lymphadenopathy.  No thyromegaly  Cardiovascular No cyanosis.  Pulmonary Breathing easily with no labor.  Neuro Symmetric facial movement.   Psychiatry Appropriate affect and mood.  Skin No skin lesions on  face or neck  Extermities Moves all extremities   Other Findings None.   Assessment & Plan: Courtney Holloway  has diagnoses of chronic tonsillitis and nasal obstruction and will go to the OR today for tonsillectomy possible adenoidectomy. Informed consent was obtained and available in EMR today. All questions have been answered, and risks/benefits/alternatives of procedure as noted in the consent were discussed in a quiet area. Questions were invited and answered. The patient expressed understanding, provided consent and wished to proceed despite risks.  We discussed management for this, including observation, antibiotics, and tonsillectomy.  We discussed R/B/A for Tonsillectomy and adenoidectomy including significant post-op pain, bleeding, and infections (still with pharyngitis) as well as persistent symptoms, VPI and risk of anesthesia. We also discussed post-op management and risks.    Hadassah BROCKS Nike [place]well 12/20/2024 9:17 AM     [1]  Current Facility-Administered Medications:    acetaminophen  (TYLENOL ) tablet 1,000 mg, 1,000 mg, Oral, Once, Howze, Kathryn E, MD   lactated ringers  infusion, , Intravenous, Continuous, Epifanio Charleston, MD   scopolamine  (TRANSDERM-SCOP) 1 MG/3DAYS 1 mg, 1 patch, Transdermal, Q72H, Paul Lamarr BRAVO, MD, 1 mg at 12/20/24 [phone]

## 2024-12-20 NOTE — Progress Notes (Signed)
 Times for oxycodone  and tylenol  last received written on dc paperwork.

## 2024-12-20 NOTE — Transfer of Care (Signed)
 Immediate Anesthesia Transfer of Care Note  Patient: Courtney Holloway  Procedure(s) Performed: TONSILLECTOMY AND ADENOIDECTOMY (Bilateral: Throat)  Patient Location: PACU  Anesthesia Type:General  Level of Consciousness: awake, alert , and oriented  Airway & Oxygen Therapy: Patient Spontanous Breathing and Patient connected to face mask oxygen  Post-op Assessment: Report given to RN and Post -op Vital signs reviewed and stable  Post vital signs: Reviewed and stable  Last Vitals:  Vitals Value Taken Time  BP 139/91 12/20/24 10:15  Temp    Pulse 82 12/20/24 10:17  Resp 12 12/20/24 10:17  SpO2 99 % 12/20/24 10:17  Vitals shown include unfiled device data.  Last Pain:  Vitals:   12/20/24 0827  TempSrc: Temporal  PainSc: 0-No pain      Patients Stated Pain Goal: 3 (12/20/24 0827)  Complications: No notable events documented.

## 2024-12-21 ENCOUNTER — Encounter (HOSPITAL_BASED_OUTPATIENT_CLINIC_OR_DEPARTMENT_OTHER): Payer: Self-pay

## 2024-12-21 LAB — SURGICAL PATHOLOGY

## 2024-12-25 ENCOUNTER — Telehealth: Payer: MEDICAID | Admitting: Family

## 2024-12-25 DIAGNOSIS — Z9089 Acquired absence of other organs: Secondary | ICD-10-CM | POA: Diagnosis not present

## 2024-12-25 DIAGNOSIS — R070 Pain in throat: Secondary | ICD-10-CM | POA: Diagnosis not present

## 2024-12-25 MED ORDER — IBUPROFEN 600 MG PO TABS: 600.0000 mg | ORAL_TABLET | Freq: Three times a day (TID) | ORAL | 0 refills | Status: AC | PRN

## 2024-12-25 MED ORDER — LIDOCAINE VISCOUS HCL 2 % MT SOLN: 15.0000 mL | OROMUCOSAL | 0 refills | Status: AC | PRN

## 2024-12-25 NOTE — Progress Notes (Signed)
 We are sorry that you are not feeling well.  Here is how we plan to help!  Based on what you have shared with me it is likely that you have throat pain. I have prescribed 2% Viscous Lidocaine  5 ml gargle and swallow every 4 hours as needed for throat pain and Ibuprofen  600 mg #28 four times a day for 7 days if needed for pain and or fever control. We can not prescribe any controlled medications through a telehealth visit. I am sorry. For throat pain, we recommend over the counter oral pain relief medications such as acetaminophen  or aspirin , or anti-inflammatory medications such as ibuprofen  or naproxen  sodium. Topical treatments such as oral throat lozenges or sprays may be used as needed.   Home Care: Only take medications as instructed by your medical team. Complete the entire course of an antibiotic. Do not take these medications with alcohol. A steam or ultrasonic humidifier can help congestion.  You can place a towel over your head and breathe in the steam from hot water  coming from a faucet. Avoid close contact with others, especially the very young and the elderly. Cover your mouth when you cough or sneeze. Always remember to wash your hands.  Get Help Right Away If: You develop worsening fever or sinus pain. You develop a severe head ache or visual changes. Your symptoms persist after you have completed your treatment plan.  Make sure you Understand these instructions. Will watch your condition. Will get help right away if you are not doing well or get worse.  Your e-visit answers were reviewed by a board certified advanced clinical practitioner to complete your personal care plan.  Depending on the condition, your plan could have included both over the counter or prescription medications.  If there is a problem, please reply once you have received a response from your provider.  Your safety is important to us .  If you have drug allergies check your prescription carefully.     You can use MyChart to ask questions about todays visit, request a non-urgent call back, or ask for a work or school excuse for 24 hours related to this e-Visit. If it has been greater than 24 hours you will need to follow up with your provider, or enter a new e-Visit to address those concerns.  You will get an e-mail in the next two days asking about your experience.  I hope that your e-visit has been valuable and will speed your recovery. Thank you for using e-visits.   I have spent 5 minutes in review of e-visit questionnaire, review and updating patient chart, medical decision making and response to patient.   Bari Learn, FNP

## 2024-12-26 ENCOUNTER — Telehealth (HOSPITAL_COMMUNITY): Payer: Self-pay | Admitting: Psychiatry

## 2024-12-26 ENCOUNTER — Encounter (HOSPITAL_COMMUNITY): Payer: MEDICAID | Admitting: Psychiatry

## 2024-12-26 NOTE — Telephone Encounter (Signed)
 Patient did not show up for the appointment.  Attempted to call the patient at  1:05 PM but received no response. Left a HIPAA compliant voicemail for patient to reschedule.   Ismael Franco, MD PGY-3 Psychiatry Resident

## 2025-01-03 ENCOUNTER — Encounter: Payer: Self-pay | Admitting: Dietician

## 2025-01-03 ENCOUNTER — Encounter: Payer: MEDICAID | Attending: Obstetrics and Gynecology | Admitting: Dietician

## 2025-01-03 VITALS — Wt 243.0 lb

## 2025-01-03 DIAGNOSIS — R7303 Prediabetes: Secondary | ICD-10-CM | POA: Insufficient documentation

## 2025-01-03 NOTE — Progress Notes (Signed)
 Medical Nutrition Therapy  Appointment Start time:  34  Appointment End time:  1545  Primary concerns today: weight loss, overall nutrition   Referral diagnosis: prediabetes Preferred learning style: no preference indicated Learning readiness: ready   NUTRITION ASSESSMENT   Anthropometrics   Wt 01/03/25: 243 lb  Clinical Medical Hx: reviewed; prediabetes, HLD, vitamin d  deficiency, anxiety, asthma, depression Medications: reviewed Labs: reviewed; 09/27/24: HDL 43, LDL 112, trig 153, vit d 16, A1c 5.8 Notable Signs/Symptoms: occasional constipation Food Allergies: none reported  Lifestyle & Dietary Hx  Pt reports she had a tonsillectomy on the 6th, and has lost 10 lb due to not being able to eat much. Pt reports she has now been introducing more foods back, but is still experiencing some pain.   Pt reports in the past she feels she ate a lot for comfort.   Pt reports she was on wegovy  but her insurance stopped covering it. Pt reports this was sometime last summer and she was also working out more consistently.   Pt reports high stress. 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 Pt reports she works night at [employer] and day at [employer]. Pt reports she is also in school at [employer] for nursing.   Pt reports she stopped drinking sodas about 3-4 months ago, and was drinking 2-3 per day at the time.   Pt reports she is trying to cook at home more.   Estimated daily fluid intake: 60-90 oz Supplements: goli apple cider vinegar, probiotic Sleep: 5 hours OR 7 hours  Stress / self-care: high stress Current average weekly physical activity: ADLs  24-Hr Dietary Recall First Meal: eggs and sausage Snack: none Second Meal: daves hot chicken Snack: doritos or talenti gelato Third Meal: culvers/chipotle OR cooks shrimp, veggies, teriyaki, rice Snack: none Beverages: 1 celcius, water    NUTRITION DIAGNOSIS   NB-1.1 Food and nutrition-related knowledge deficit As related to lack of prior education by a registered dietitian.  As evidenced by pt report.   NUTRITION INTERVENTION  Nutrition education (E-1) on the following topics:   Vitamin D  Sunlight Exposure: Spend time outdoors: Exposure to sunlight is a natural way for the body to produce vitamin D . Aim for around 10-30 minutes of sun exposure on the face, arms, and legs, at least twice a week. Time of day: Sun exposure is most effective when the sun is high in the sky (between 10 am and 3 pm).  Dietary Sources: Fatty fish: Include salmon, mackerel, and sardines in your diet. Fortified foods: Consume vitamin D -fortified foods such as fortified milk, orange juice, and cereals. Eggs: Eat egg yolks, which naturally contain vitamin D . Supplements:  Vitamin D  supplements: vitamin D  supplements dosage is based on individual needs and the degree of deficiency. Types of supplements: Vitamin D3 (cholecalciferol) is often recommended as it is more effective at raising and maintaining vitamin D  levels in the body.  Regular Monitoring: Blood tests: Periodically monitor vitamin D  levels through blood tests to assess progress and adjust supplementation if necessary.  Healthy Lifestyle: Maintain a healthy diet: Ensure that the overall diet includes a balanced mix of nutrients to support overall health. Regular exercise: Physical activity can contribute to overall well-being, including bone health  Exercise Finding an exercise you enjoy is crucial for maintaining long-term fitness and overall health. Enjoyable activities are more likely to become regular habits, making it easier to stay consistent  with physical activity. When you look forward to your workouts, exercise becomes a positive experience rather than a chore, reducing the likelihood of burnout or quitting. Enjoyable exercise also enhances mental well-being, as engaging in activities you love can  boost mood, reduce stress, and provide a sense of accomplishment. Aim for 150 minutes of physical activity weekly. Make physical activity a part of your week. Try to include at least 30 minutes of physical activity 5 days each week or at least 150 minutes per week. Regular physical activity promotes overall health-including helping to reduce risk for heart disease and diabetes, promoting mental health, and helping us  sleep better.     Plate Method Fruits & Vegetables: Aim to fill half your plate with a variety of fruits and vegetables. They are rich in vitamins, minerals, and fiber, and can help reduce the risk of chronic diseases. Choose a colorful assortment of fruits and vegetables to ensure you get a wide range of nutrients. Grains and Starches: Make at least half of your grain choices whole grains, such as brown rice, whole wheat bread, and oats. Whole grains provide fiber, which aids in digestion and healthy cholesterol levels. Aim for whole forms of starchy vegetables such as potatoes, sweet potatoes, beans, peas, and corn, which are fiber rich and provide many vitamins and minerals.  Protein: Incorporate lean sources of protein, such as poultry, fish, beans, nuts, and seeds, into your meals. Protein is essential for building and repairing tissues, staying full, balancing blood sugar, as well as supporting immune function. Dairy: Include low-fat or fat-free dairy products like milk, yogurt, and cheese in your diet. Dairy foods are excellent sources of calcium  and vitamin D , which are crucial for bone health.   Grocery Tips Assisted pt in building a grocery list.   Handouts Provided Include  Plate Method Grocery List  Learning Style & Readiness for Change Teaching method utilized: Visual & Auditory  Demonstrated degree of understanding via: Teach Back  Barriers to learning/adherence to lifestyle change: none  Goals Established by Pt  Goal 1: Start vitamin D  supplementation.   Goal 2:  Go to the gym 1-2 times a week for at least 30 minutes.   Goal 3: Cook 4 times a week. Make enough for leftovers.    MONITORING & EVALUATION Dietary intake, weekly physical activity, and follow up in 4-6 weeks.  Next Steps  Patient is to call for questions.

## 2025-01-03 NOTE — Patient Instructions (Signed)
 Goals Established by Pt  Goal 1: Start vitamin D  supplementation.   Goal 2: Go to the gym 1-2 times a week for at least 30 minutes.   Goal 3: Cook 4 times a week. Make enough for leftovers.

## 2025-01-04 ENCOUNTER — Encounter (INDEPENDENT_AMBULATORY_CARE_PROVIDER_SITE_OTHER): Payer: MEDICAID

## 2025-01-04 ENCOUNTER — Encounter (INDEPENDENT_AMBULATORY_CARE_PROVIDER_SITE_OTHER): Payer: Self-pay

## 2025-01-04 ENCOUNTER — Ambulatory Visit (INDEPENDENT_AMBULATORY_CARE_PROVIDER_SITE_OTHER): Payer: MEDICAID

## 2025-01-04 VITALS — BP 117/78 | HR 92

## 2025-01-04 DIAGNOSIS — J3489 Other specified disorders of nose and nasal sinuses: Secondary | ICD-10-CM

## 2025-01-04 DIAGNOSIS — J3501 Chronic tonsillitis: Secondary | ICD-10-CM

## 2025-01-04 DIAGNOSIS — J351 Hypertrophy of tonsils: Secondary | ICD-10-CM

## 2025-01-04 DIAGNOSIS — Z09 Encounter for follow-up examination after completed treatment for conditions other than malignant neoplasm: Secondary | ICD-10-CM

## 2025-01-04 NOTE — Progress Notes (Signed)
 S/p tonsillectomy and adenoidectomy on 12/20/2024 S: Patient reports she had some significant pain during the first week.  After this, the pain subsided and she is now doing well.  No bleeding.  Nasal obstruction improved.  O: Bilateral tonsillar fossa healing, no bleeding  A/P: 24 y.o. female with chronic tonsillitis and nasal obstruction s/p tonsillectomy and adenoidectomy  Doing very well Okay to resume activity with no restrictions Follow-up as needed
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
## 2025-01-24 ENCOUNTER — Ambulatory Visit: Payer: Self-pay
